# Patient Record
Sex: Female | Born: 1940
Health system: Southern US, Community
[De-identification: ages and names within clinical notes are randomized; demographics above are authoritative.]

## PROBLEM LIST (undated history)

## (undated) DIAGNOSIS — G4733 Obstructive sleep apnea (adult) (pediatric): Secondary | ICD-10-CM

## (undated) DIAGNOSIS — E559 Vitamin D deficiency, unspecified: Secondary | ICD-10-CM

## (undated) DIAGNOSIS — N289 Disorder of kidney and ureter, unspecified: Secondary | ICD-10-CM

## (undated) DIAGNOSIS — G2581 Restless legs syndrome: Secondary | ICD-10-CM

## (undated) DIAGNOSIS — F32A Depression, unspecified: Secondary | ICD-10-CM

## (undated) DIAGNOSIS — E785 Hyperlipidemia, unspecified: Secondary | ICD-10-CM

## (undated) DIAGNOSIS — J189 Pneumonia, unspecified organism: Secondary | ICD-10-CM

## (undated) DIAGNOSIS — J449 Chronic obstructive pulmonary disease, unspecified: Secondary | ICD-10-CM

## (undated) DIAGNOSIS — F329 Major depressive disorder, single episode, unspecified: Secondary | ICD-10-CM

## (undated) DIAGNOSIS — M069 Rheumatoid arthritis, unspecified: Secondary | ICD-10-CM

## (undated) DIAGNOSIS — Z8669 Personal history of other diseases of the nervous system and sense organs: Secondary | ICD-10-CM

## (undated) DIAGNOSIS — M1990 Unspecified osteoarthritis, unspecified site: Secondary | ICD-10-CM

## (undated) HISTORY — DX: Obstructive sleep apnea (adult) (pediatric): G47.33

## (undated) HISTORY — DX: Personal history of other diseases of the nervous system and sense organs: Z86.69

## (undated) HISTORY — DX: Hyperlipidemia, unspecified: E78.5

## (undated) HISTORY — DX: Rheumatoid arthritis, unspecified: M06.9

## (undated) HISTORY — DX: Major depressive disorder, single episode, unspecified: F32.9

## (undated) HISTORY — DX: Disorder of kidney and ureter, unspecified: N28.9

## (undated) HISTORY — PX: KNEE SURGERY: SHX244

## (undated) HISTORY — DX: Pneumonia, unspecified organism: J18.9

## (undated) HISTORY — DX: Depression, unspecified: F32.A

## (undated) HISTORY — DX: Vitamin D deficiency, unspecified: E55.9

## (undated) HISTORY — DX: Chronic obstructive pulmonary disease, unspecified: J44.9

## (undated) HISTORY — DX: Restless legs syndrome: G25.81

## (undated) HISTORY — DX: Unspecified osteoarthritis, unspecified site: M19.90

---

## 1988-06-11 HISTORY — PX: TOTAL ABDOMINAL HYSTERECTOMY: SHX209

## 2011-03-15 ENCOUNTER — Encounter: Payer: Self-pay | Admitting: Pulmonary Disease

## 2011-03-18 ENCOUNTER — Ambulatory Visit (INDEPENDENT_AMBULATORY_CARE_PROVIDER_SITE_OTHER)
Admission: RE | Admit: 2011-03-18 | Discharge: 2011-03-18 | Disposition: A | Discharge status: Home / Self Care | Payer: Medicare Other | Source: Ambulatory Visit | Attending: Pulmonary Disease | Admitting: Pulmonary Disease

## 2011-03-18 ENCOUNTER — Encounter: Payer: Self-pay | Admitting: Pulmonary Disease

## 2011-03-18 ENCOUNTER — Ambulatory Visit (INDEPENDENT_AMBULATORY_CARE_PROVIDER_SITE_OTHER): Payer: Medicare Other | Admitting: Pulmonary Disease

## 2011-03-18 VITALS — BP 140/70 | HR 90 | Temp 98.2°F | Ht 63.5 in | Wt 230.6 lb

## 2011-03-18 DIAGNOSIS — R0609 Other forms of dyspnea: Secondary | ICD-10-CM | POA: Insufficient documentation

## 2011-03-18 DIAGNOSIS — J449 Chronic obstructive pulmonary disease, unspecified: Secondary | ICD-10-CM | POA: Insufficient documentation

## 2011-03-18 DIAGNOSIS — R0600 Dyspnea, unspecified: Secondary | ICD-10-CM

## 2011-03-18 DIAGNOSIS — R0989 Other specified symptoms and signs involving the circulatory and respiratory systems: Secondary | ICD-10-CM

## 2011-03-18 NOTE — Patient Instructions (Signed)
No change of inhaler medications currently Will check a cxr today Will contact you once I get a copy of your recent breathing tests.

## 2011-03-18 NOTE — Progress Notes (Signed)
  Subjective:    Patient ID: Sherry Burke, female    DOB: [birth year], 70 y.o.   MRN: [id number]  HPI The pt is a 70y/o female who I have been asked to see for dyspnea on exertion.  She carries the diagnosis of "copd", although she has never smoked.  She does state that she was diagnosed with asthma in her early 18's.  She has been on advair for a long time, and thinks it has helped her symptoms.  Currently, she c/o doe at less than one block at a moderate pace, and will get winded bringing groceries in from the car.  She feels a tightness in her chest with activity, but tells me she had a negative cath in [place] in 2009.  She has minimal cough and no mucus, and her weight is down about 15 pounds in the last one year. She has a h/o "mtx toxicity" in the past which resulted in a "lung disease".     Review of Systems  Constitutional: Negative for fever and unexpected weight change.  HENT: Positive for dental problem. Negative for ear pain, nosebleeds, congestion, sore throat, rhinorrhea, sneezing, trouble swallowing, postnasal drip and sinus pressure.   Eyes: Negative for redness and itching.  Respiratory: Positive for cough and shortness of breath. Negative for chest tightness and wheezing.   Cardiovascular: Positive for leg swelling. Negative for palpitations.  Gastrointestinal: Negative for nausea and vomiting.  Genitourinary: Negative for dysuria.  Musculoskeletal: Positive for joint swelling.  Skin: Negative for rash.  Neurological: Negative for headaches.  Hematological: Does not bruise/bleed easily.  Psychiatric/Behavioral: Positive for dysphoric mood. The patient is nervous/anxious.        Objective:   Physical Exam Constitutional: obese female, no acute distress  HENT:  Nares patent without discharge  Oropharynx without exudate, palate and uvula are normal  Eyes:  Perrla, eomi, no scleral icterus  Neck:  No JVD, no TMG  Cardiovascular:  Normal rate, regular rhythm, no rubs  or gallops.  No murmurs        Intact distal pulses  Pulmonary :  Normal breath sounds, no stridor or respiratory distress   No rales, rhonchi, or wheezing  Abdominal:  Soft, nondistended, bowel sounds present.  No tenderness noted.   Musculoskeletal:  No significant lower extremity edema noted.  Lymph Nodes:  No cervical lymphadenopathy noted  Skin:  No cyanosis noted  Neurologic:  Alert, appropriate, moves all 4 extremities without obvious deficit.         Assessment & Plan:

## 2011-03-18 NOTE — Assessment & Plan Note (Signed)
The pt carries a diagnosis of "copd", but has never smoked.  If this has been documented by pfts, it is unclear if this is due to fixed asthma, ?bronchiolitis related to her RA, or perhaps idiopathic copd?  The pt is having chest tightness with exertional activities, and I think we need to keep in mind angina even though she tells me she had negative cath in 2009.  Will check cxr, and schedule for full pfts (her last testing only had an expiratory time of 3 sec.).  She is to continue on her current meds until I see her back.  I have also encourage her to work on weight loss and some type of conditioning program.

## 2011-03-21 ENCOUNTER — Telehealth: Payer: Self-pay | Admitting: Pulmonary Disease

## 2011-03-21 NOTE — Telephone Encounter (Signed)
Please let pt know that her cxr is ok. No fibrosis, but does show calcified "spots" that are old.  Pt aware of cxr results and verbalized understanding and had no questions

## 2011-04-02 ENCOUNTER — Encounter: Payer: Self-pay | Admitting: Pulmonary Disease

## 2011-04-10 ENCOUNTER — Encounter: Payer: Self-pay | Admitting: Pulmonary Disease

## 2011-04-10 ENCOUNTER — Ambulatory Visit (INDEPENDENT_AMBULATORY_CARE_PROVIDER_SITE_OTHER): Payer: Medicare Other | Admitting: Pulmonary Disease

## 2011-04-10 VITALS — BP 136/74 | HR 101 | Temp 98.0°F | Ht 64.0 in | Wt 227.0 lb

## 2011-04-10 DIAGNOSIS — R0989 Other specified symptoms and signs involving the circulatory and respiratory systems: Secondary | ICD-10-CM

## 2011-04-10 DIAGNOSIS — R0609 Other forms of dyspnea: Secondary | ICD-10-CM

## 2011-04-10 DIAGNOSIS — R0600 Dyspnea, unspecified: Secondary | ICD-10-CM

## 2011-04-10 MED ORDER — MOMETASONE FURO-FORMOTEROL FUM 200-5 MCG/ACT IN AERO: 2.0000 | INHALATION_SPRAY | Freq: Two times a day (BID) | RESPIRATORY_TRACT | Status: DC

## 2011-04-10 NOTE — Progress Notes (Signed)
PFT done today. 

## 2011-04-10 NOTE — Progress Notes (Signed)
  Subjective:    Patient ID: Sherry Burke, female    DOB: 14-Sep-1941, 70 y.o.   MRN: [id number]  HPI The pt comes in today for f/u of her pfts as part of a w/u for doe.  She was found to have moderate airflow obstruction, but no restriction or DLCO abnl.  I have reviewed the study with her in detail, and answered all of here questions.  I have told her there is no evidence for lung damage due to MTX by either her breathing studies or cxr.    Review of Systems  Constitutional: Negative for fever and unexpected weight change.  HENT: Positive for dental problem and sinus pressure. Negative for ear pain, nosebleeds, congestion, sore throat, rhinorrhea, sneezing, trouble swallowing and postnasal drip.   Eyes: Negative for redness and itching.  Respiratory: Positive for chest tightness, shortness of breath and wheezing. Negative for cough.   Cardiovascular: Negative for palpitations and leg swelling.  Gastrointestinal: Negative for nausea and vomiting.  Genitourinary: Positive for dysuria.  Musculoskeletal: Positive for joint swelling.  Skin: Negative for rash.  Neurological: Negative for headaches.  Hematological: Bruises/bleeds easily.  Psychiatric/Behavioral: Positive for dysphoric mood. The patient is nervous/anxious.        Objective:   Physical Exam Obese female in nad Chest with mildly decreased bs, no wheezing Cor with rrr.  LE without edema, no cyanosis  Alert, moves all 4        Assessment & Plan:

## 2011-04-10 NOTE — Patient Instructions (Signed)
Stop advair Trial of dulera 200/5  2 inhalations am and pm for next 4 weeks.  Rinse mouth well, gargle, and swallow.  Please call me in 4 weeks with your response

## 2011-04-13 ENCOUNTER — Encounter: Payer: Self-pay | Admitting: Pulmonary Disease

## 2011-04-13 NOTE — Assessment & Plan Note (Signed)
The pt has moderate obstructive disease that I suspect is due to fixed asthma based on her history.  However, cannot exclude the possibility of bronchiolitis obliterans associated with her known RA.  There is nothing to suggest chronic lung disease associated with MTX treatment.  At this point, would like to try her on a higher dose of ICS/LABA and see if she has improvement.  I have also explained to pt that her weight and deconditioning are important contributors to her sob as well.

## 2011-04-22 ENCOUNTER — Telehealth: Payer: Self-pay | Admitting: Pulmonary Disease

## 2011-04-22 NOTE — Telephone Encounter (Signed)
Spoke with pt and advised needs to have her PCP refill her sleep meds. Pt verbalized understanding.

## 2011-04-22 NOTE — Telephone Encounter (Signed)
I do not see where [facility] has filled this for her- LMTCB

## 2011-05-09 ENCOUNTER — Encounter: Payer: Self-pay | Admitting: Pulmonary Disease

## 2011-05-10 ENCOUNTER — Telehealth: Payer: Self-pay | Admitting: Pulmonary Disease

## 2011-05-10 NOTE — Telephone Encounter (Signed)
PATIENT WAS GIVEN DULERA INHALER ON 03/18/11 FOR ASTHMA.  SHE WAS SUPPOSED TO USE FOR 1 MONTH AND CALL BACK TO TELL DR CLANCE IF THERE WAS IMPROVEMENT.  PATIENT FEELS IMPROVED AND WANTS PRESCRIPTION.  PHARMACY IS [facility].  SHE HAS ONLY 4 DAYS WORTH LEFT.

## 2011-05-10 NOTE — Telephone Encounter (Signed)
Per pt instructions from 04/10/11:  Stop advair  Trial of dulera 200/5 2 inhalations am and pm for next 4 weeks. Rinse mouth well, gargle, and swallow.  Please call me in 4 weeks with your response    Called, spoke with pt.  States she has been using the dulera 2 puffs bid and this has helped her breathing.  She has enough to last 1 more week and would like to know if [facility] would like her to continue this.  If so, would like 3 mo rx sent to [facility].  KC, pls advise.  Thanks!

## 2011-05-10 NOTE — Telephone Encounter (Signed)
Ok to call in script

## 2011-05-13 MED ORDER — MOMETASONE FURO-FORMOTEROL FUM 200-5 MCG/ACT IN AERO: 2.0000 | INHALATION_SPRAY | Freq: Two times a day (BID) | RESPIRATORY_TRACT | Status: DC

## 2011-05-13 NOTE — Telephone Encounter (Signed)
dulera rx sent to [facility] for 3 month supply -- pt aware and will call back to see if samples available if she will run out of medication before shipment arrives.

## 2011-05-16 ENCOUNTER — Encounter (HOSPITAL_COMMUNITY): Payer: Medicare Other | Attending: Rheumatology

## 2011-05-16 DIAGNOSIS — M069 Rheumatoid arthritis, unspecified: Secondary | ICD-10-CM | POA: Insufficient documentation

## 2011-05-31 ENCOUNTER — Encounter (HOSPITAL_COMMUNITY): Payer: Medicare Other

## 2011-06-03 ENCOUNTER — Encounter (HOSPITAL_COMMUNITY): Payer: Medicare Other

## 2011-08-02 IMAGING — CR DG CHEST 2V
2 series · 2 of 2 positions shown · non-contrast
Comparison: None.

CLINICAL DATA: Shortness of breath, COPD

CHEST - 2 VIEW

[view not recorded (1 of 2)]
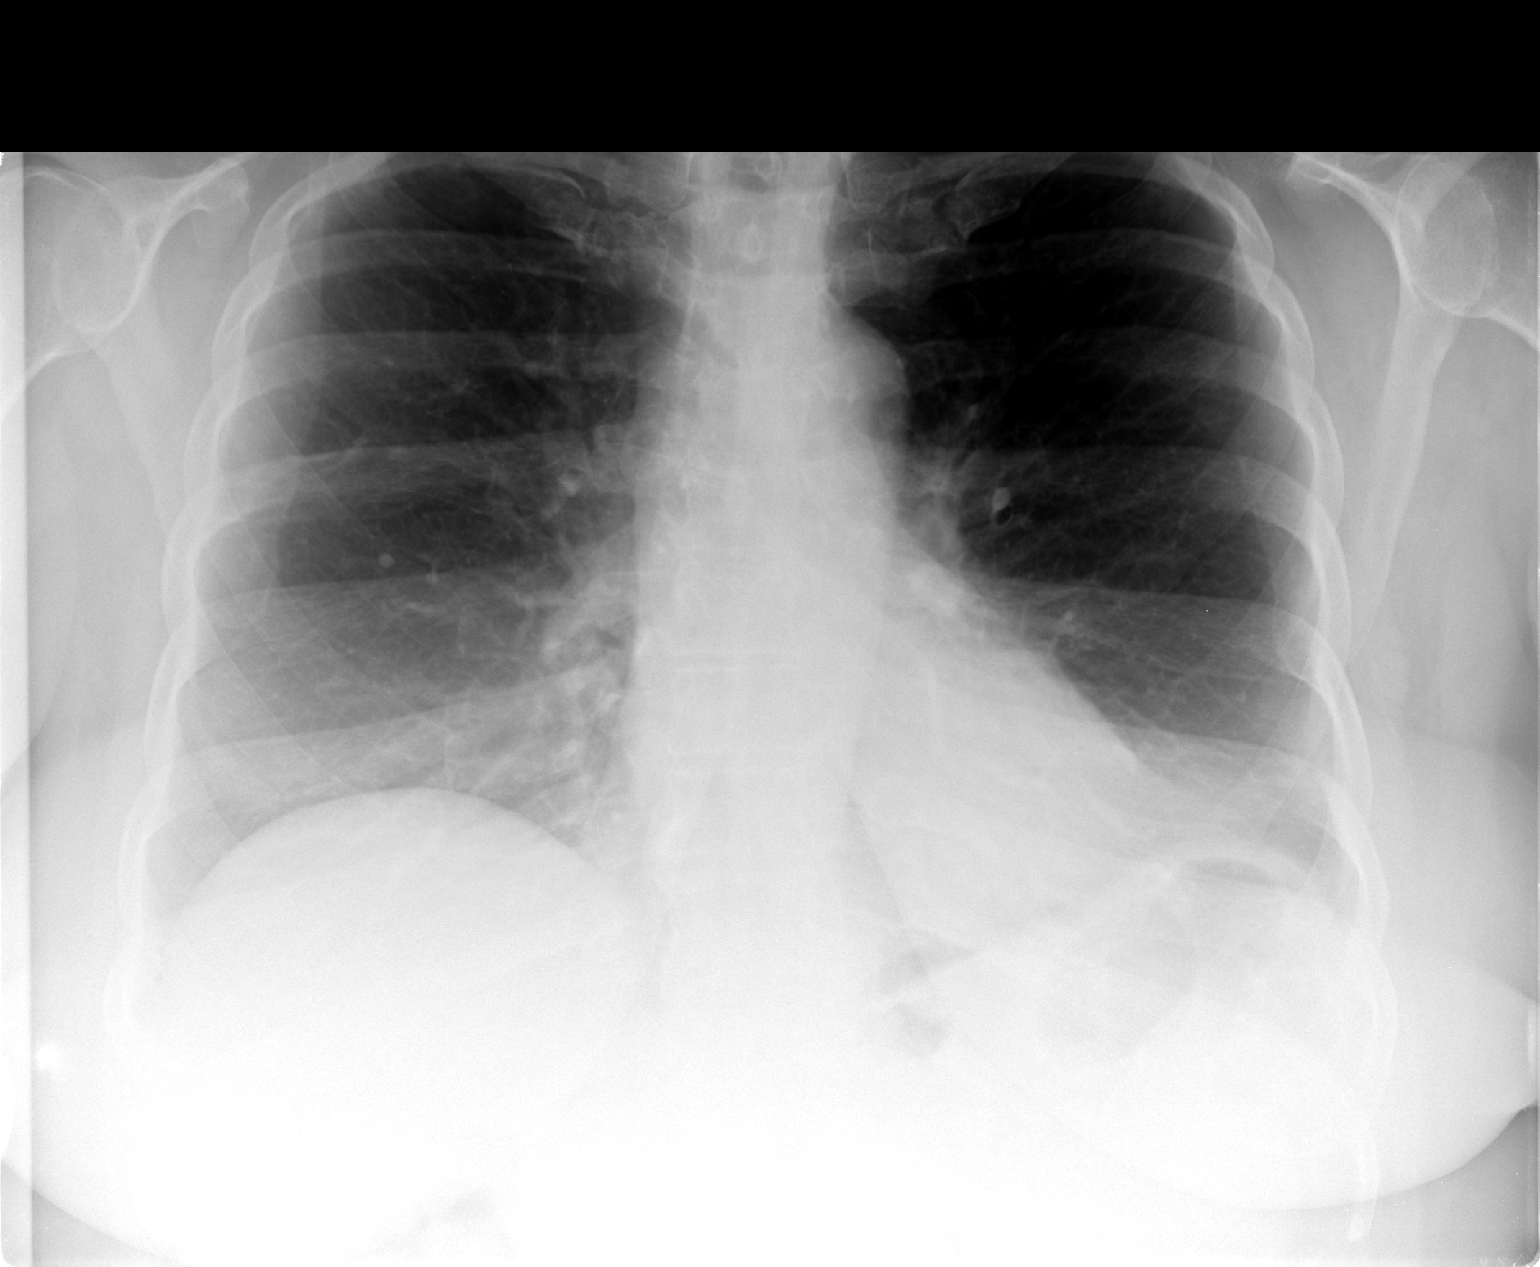

[view not recorded (2 of 2)]
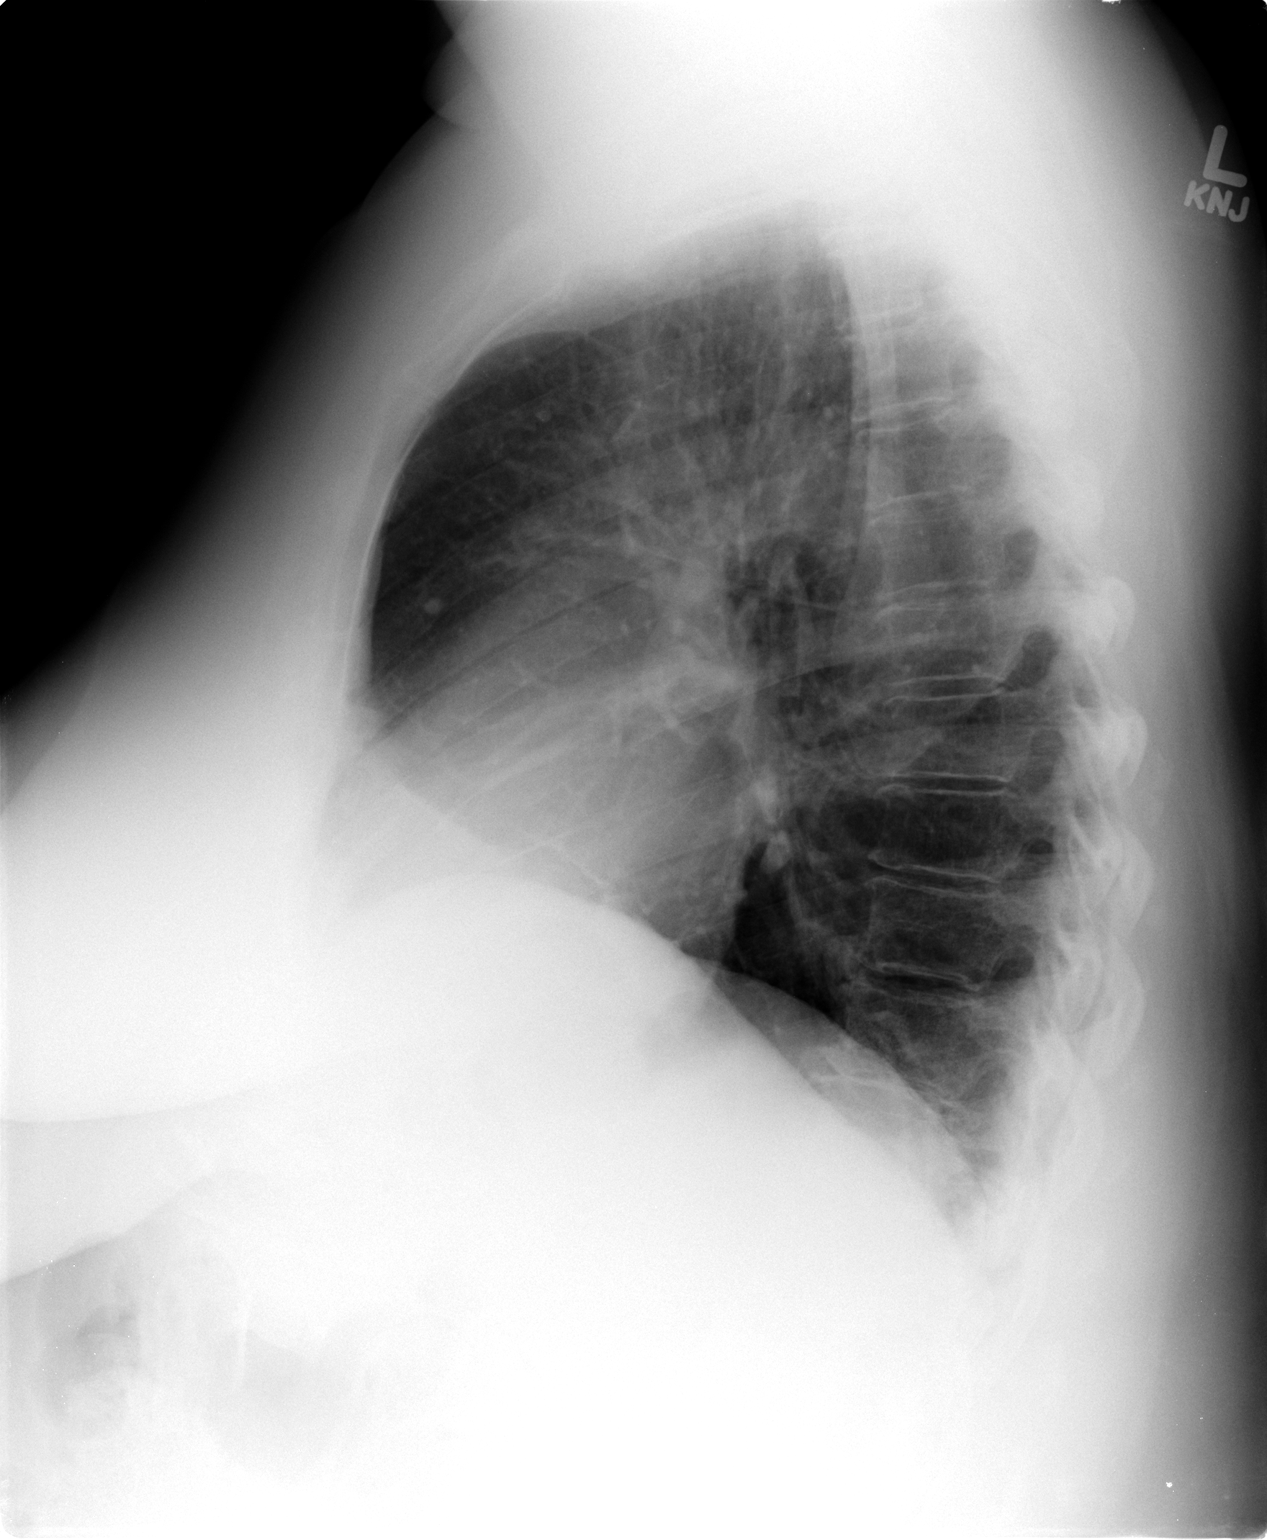

[2 of 2 positions shown; findings below may reference images not displayed]

FINDINGS: Small nodular opacities bilaterally most like represent
faintly calcified granulomas with probable left hilar calcified
nodes as well most consistent with prior granulomatous disease.  No
active infiltrate or effusion is seen.  Mediastinal contours are
normal.  The heart is within normal limits in size.  No bony
abnormality is seen.
IMPRESSION: No active lung disease.  Probable prior granulomatous disease.
Compare with prior or follow-up chest x-ray.

## 2011-10-14 ENCOUNTER — Other Ambulatory Visit: Payer: Self-pay | Admitting: Rheumatology

## 2011-11-14 DIAGNOSIS — M461 Sacroiliitis, not elsewhere classified: Secondary | ICD-10-CM | POA: Diagnosis not present

## 2011-11-14 DIAGNOSIS — M5137 Other intervertebral disc degeneration, lumbosacral region: Secondary | ICD-10-CM | POA: Diagnosis not present

## 2011-11-14 DIAGNOSIS — M47817 Spondylosis without myelopathy or radiculopathy, lumbosacral region: Secondary | ICD-10-CM | POA: Diagnosis not present

## 2011-11-14 DIAGNOSIS — IMO0002 Reserved for concepts with insufficient information to code with codable children: Secondary | ICD-10-CM | POA: Diagnosis not present

## 2011-11-14 DIAGNOSIS — G5600 Carpal tunnel syndrome, unspecified upper limb: Secondary | ICD-10-CM | POA: Diagnosis not present

## 2011-11-28 DIAGNOSIS — G5600 Carpal tunnel syndrome, unspecified upper limb: Secondary | ICD-10-CM | POA: Diagnosis not present

## 2011-12-10 DIAGNOSIS — M5137 Other intervertebral disc degeneration, lumbosacral region: Secondary | ICD-10-CM | POA: Diagnosis not present

## 2011-12-10 DIAGNOSIS — M545 Low back pain, unspecified: Secondary | ICD-10-CM | POA: Diagnosis not present

## 2011-12-10 DIAGNOSIS — IMO0002 Reserved for concepts with insufficient information to code with codable children: Secondary | ICD-10-CM | POA: Diagnosis not present

## 2011-12-10 DIAGNOSIS — M47817 Spondylosis without myelopathy or radiculopathy, lumbosacral region: Secondary | ICD-10-CM | POA: Diagnosis not present

## 2011-12-13 DIAGNOSIS — N3000 Acute cystitis without hematuria: Secondary | ICD-10-CM | POA: Diagnosis not present

## 2011-12-13 DIAGNOSIS — R7309 Other abnormal glucose: Secondary | ICD-10-CM | POA: Diagnosis not present

## 2011-12-13 DIAGNOSIS — E7800 Pure hypercholesterolemia, unspecified: Secondary | ICD-10-CM | POA: Diagnosis not present

## 2011-12-13 DIAGNOSIS — M549 Dorsalgia, unspecified: Secondary | ICD-10-CM | POA: Diagnosis not present

## 2011-12-13 DIAGNOSIS — F321 Major depressive disorder, single episode, moderate: Secondary | ICD-10-CM | POA: Diagnosis not present

## 2011-12-13 DIAGNOSIS — Z79899 Other long term (current) drug therapy: Secondary | ICD-10-CM | POA: Diagnosis not present

## 2011-12-13 DIAGNOSIS — E782 Mixed hyperlipidemia: Secondary | ICD-10-CM | POA: Diagnosis not present

## 2011-12-13 DIAGNOSIS — I10 Essential (primary) hypertension: Secondary | ICD-10-CM | POA: Diagnosis not present

## 2011-12-26 DIAGNOSIS — B370 Candidal stomatitis: Secondary | ICD-10-CM | POA: Diagnosis not present

## 2011-12-26 DIAGNOSIS — N3000 Acute cystitis without hematuria: Secondary | ICD-10-CM | POA: Diagnosis not present

## 2011-12-26 DIAGNOSIS — J45901 Unspecified asthma with (acute) exacerbation: Secondary | ICD-10-CM | POA: Diagnosis not present

## 2011-12-26 DIAGNOSIS — R339 Retention of urine, unspecified: Secondary | ICD-10-CM | POA: Diagnosis not present

## 2011-12-26 DIAGNOSIS — J0100 Acute maxillary sinusitis, unspecified: Secondary | ICD-10-CM | POA: Diagnosis not present

## 2011-12-30 DIAGNOSIS — G5600 Carpal tunnel syndrome, unspecified upper limb: Secondary | ICD-10-CM | POA: Diagnosis not present

## 2011-12-30 DIAGNOSIS — M47817 Spondylosis without myelopathy or radiculopathy, lumbosacral region: Secondary | ICD-10-CM | POA: Diagnosis not present

## 2011-12-30 DIAGNOSIS — M5137 Other intervertebral disc degeneration, lumbosacral region: Secondary | ICD-10-CM | POA: Diagnosis not present

## 2011-12-30 DIAGNOSIS — IMO0002 Reserved for concepts with insufficient information to code with codable children: Secondary | ICD-10-CM | POA: Diagnosis not present

## 2012-01-13 DIAGNOSIS — M069 Rheumatoid arthritis, unspecified: Secondary | ICD-10-CM | POA: Diagnosis not present

## 2012-01-23 DIAGNOSIS — N3000 Acute cystitis without hematuria: Secondary | ICD-10-CM | POA: Diagnosis not present

## 2012-01-28 ENCOUNTER — Other Ambulatory Visit (HOSPITAL_COMMUNITY): Payer: Self-pay | Admitting: *Deleted

## 2012-01-28 MED ORDER — SODIUM CHLORIDE 0.9 % IV SOLN: 1000.0000 mg | INTRAVENOUS | Status: DC

## 2012-01-29 ENCOUNTER — Encounter (HOSPITAL_COMMUNITY)
Admission: RE | Admit: 2012-01-29 | Discharge: 2012-01-29 | Disposition: A | Discharge status: Home / Self Care | Payer: Medicare Other | Source: Ambulatory Visit | Attending: Rheumatology | Admitting: Rheumatology

## 2012-01-29 DIAGNOSIS — M069 Rheumatoid arthritis, unspecified: Secondary | ICD-10-CM | POA: Diagnosis not present

## 2012-01-29 MED ORDER — METHYLPREDNISOLONE SODIUM SUCC 125 MG IJ SOLR
100.0000 mg | INTRAMUSCULAR | Status: DC
  Administered 2012-01-29: 100 mg via INTRAVENOUS
  Filled 2012-01-29: qty 2

## 2012-01-29 MED ORDER — SODIUM CHLORIDE 0.9 % IV SOLN
INTRAVENOUS | Status: DC
  Administered 2012-01-29: 09:00:00 via INTRAVENOUS

## 2012-01-29 MED ORDER — SODIUM CHLORIDE 0.9 % IV SOLN
1000.0000 mg | INTRAVENOUS | Status: DC
  Administered 2012-01-29: 1000 mg via INTRAVENOUS
  Filled 2012-01-29: qty 100

## 2012-02-12 ENCOUNTER — Encounter (HOSPITAL_COMMUNITY)
Admission: RE | Admit: 2012-02-12 | Discharge: 2012-02-12 | Disposition: A | Discharge status: Home / Self Care | Payer: Medicare Other | Source: Ambulatory Visit | Attending: Rheumatology | Admitting: Rheumatology

## 2012-02-12 DIAGNOSIS — M069 Rheumatoid arthritis, unspecified: Secondary | ICD-10-CM | POA: Insufficient documentation

## 2012-02-12 MED ORDER — LORATADINE 10 MG PO TABS
10.0000 mg | ORAL_TABLET | Freq: Once | ORAL | Status: AC
  Administered 2012-02-12: 10 mg via ORAL
  Filled 2012-02-12: qty 1

## 2012-02-12 MED ORDER — METHYLPREDNISOLONE SODIUM SUCC 125 MG IJ SOLR
100.0000 mg | INTRAMUSCULAR | Status: AC
  Administered 2012-02-12: 100 mg via INTRAVENOUS
  Filled 2012-02-12: qty 2

## 2012-02-12 MED ORDER — SODIUM CHLORIDE 0.9 % IV SOLN
1000.0000 mg | INTRAVENOUS | Status: AC
  Administered 2012-02-12: 1000 mg via INTRAVENOUS
  Filled 2012-02-12: qty 100

## 2012-02-12 MED ORDER — SODIUM CHLORIDE 0.9 % IV SOLN
INTRAVENOUS | Status: AC
  Administered 2012-02-12: 09:00:00 via INTRAVENOUS

## 2012-02-12 NOTE — Progress Notes (Signed)
Patient states that she forgot to take her Zyrtec last night. Called Dr. Tonette Bihari office and spoke with Wallis Bamberg, RN. Orders received.

## 2012-02-17 DIAGNOSIS — M47817 Spondylosis without myelopathy or radiculopathy, lumbosacral region: Secondary | ICD-10-CM | POA: Diagnosis not present

## 2012-02-17 DIAGNOSIS — IMO0002 Reserved for concepts with insufficient information to code with codable children: Secondary | ICD-10-CM | POA: Diagnosis not present

## 2012-02-21 DIAGNOSIS — M549 Dorsalgia, unspecified: Secondary | ICD-10-CM | POA: Diagnosis not present

## 2012-02-21 DIAGNOSIS — I10 Essential (primary) hypertension: Secondary | ICD-10-CM | POA: Diagnosis not present

## 2012-02-21 DIAGNOSIS — G2581 Restless legs syndrome: Secondary | ICD-10-CM | POA: Diagnosis not present

## 2012-02-21 DIAGNOSIS — R7309 Other abnormal glucose: Secondary | ICD-10-CM | POA: Diagnosis not present

## 2012-02-21 DIAGNOSIS — R7301 Impaired fasting glucose: Secondary | ICD-10-CM | POA: Diagnosis not present

## 2012-02-21 DIAGNOSIS — E7800 Pure hypercholesterolemia, unspecified: Secondary | ICD-10-CM | POA: Diagnosis not present

## 2012-02-21 DIAGNOSIS — N3000 Acute cystitis without hematuria: Secondary | ICD-10-CM | POA: Diagnosis not present

## 2012-02-21 DIAGNOSIS — Z79899 Other long term (current) drug therapy: Secondary | ICD-10-CM | POA: Diagnosis not present

## 2012-02-21 DIAGNOSIS — R5381 Other malaise: Secondary | ICD-10-CM | POA: Diagnosis not present

## 2012-02-21 DIAGNOSIS — E782 Mixed hyperlipidemia: Secondary | ICD-10-CM | POA: Diagnosis not present

## 2012-03-13 DIAGNOSIS — H2589 Other age-related cataract: Secondary | ICD-10-CM | POA: Diagnosis not present

## 2012-03-26 DIAGNOSIS — E782 Mixed hyperlipidemia: Secondary | ICD-10-CM | POA: Diagnosis not present

## 2012-03-26 DIAGNOSIS — G4730 Sleep apnea, unspecified: Secondary | ICD-10-CM | POA: Diagnosis not present

## 2012-03-26 DIAGNOSIS — G2581 Restless legs syndrome: Secondary | ICD-10-CM | POA: Diagnosis not present

## 2012-03-26 DIAGNOSIS — I10 Essential (primary) hypertension: Secondary | ICD-10-CM | POA: Diagnosis not present

## 2012-03-26 DIAGNOSIS — M549 Dorsalgia, unspecified: Secondary | ICD-10-CM | POA: Diagnosis not present

## 2012-03-31 DIAGNOSIS — H2589 Other age-related cataract: Secondary | ICD-10-CM | POA: Diagnosis not present

## 2012-04-07 DIAGNOSIS — H2589 Other age-related cataract: Secondary | ICD-10-CM | POA: Diagnosis not present

## 2012-04-07 DIAGNOSIS — H269 Unspecified cataract: Secondary | ICD-10-CM | POA: Diagnosis not present

## 2012-04-21 DIAGNOSIS — G4730 Sleep apnea, unspecified: Secondary | ICD-10-CM | POA: Diagnosis not present

## 2012-04-21 DIAGNOSIS — I10 Essential (primary) hypertension: Secondary | ICD-10-CM | POA: Diagnosis not present

## 2012-04-21 DIAGNOSIS — M549 Dorsalgia, unspecified: Secondary | ICD-10-CM | POA: Diagnosis not present

## 2012-04-22 ENCOUNTER — Encounter: Payer: Self-pay | Admitting: Pulmonary Disease

## 2012-04-22 ENCOUNTER — Ambulatory Visit (INDEPENDENT_AMBULATORY_CARE_PROVIDER_SITE_OTHER): Payer: Medicare Other | Admitting: Pulmonary Disease

## 2012-04-22 VITALS — BP 130/76 | HR 91 | Temp 98.1°F | Ht 62.0 in | Wt 219.2 lb

## 2012-04-22 DIAGNOSIS — G2581 Restless legs syndrome: Secondary | ICD-10-CM | POA: Insufficient documentation

## 2012-04-22 DIAGNOSIS — G4733 Obstructive sleep apnea (adult) (pediatric): Secondary | ICD-10-CM

## 2012-04-22 MED ORDER — GABAPENTIN ENACARBIL ER 600 MG PO TBCR: 600.0000 mg | EXTENDED_RELEASE_TABLET | Freq: Every evening | ORAL | Status: AC

## 2012-04-22 NOTE — Patient Instructions (Addendum)
Will start on horizant 600mg  each evening around 5pm with food.   Stop requip while trying horizant. Will set up on cpap with nasal pillows.  Please call if having issues with cpap tolerance.  Followup with me in 5 weeks to check on cpap, but call after 1-2 weeks on horizant to let me know how your leg movements are doing.

## 2012-04-22 NOTE — Assessment & Plan Note (Signed)
The patient has a history of moderate sleep apnea in the past, and most likely has gained weight since that time.  She sleeps alone, and therefore it is unclear if she continues to have snoring and an abnormal breathing pattern during sleep.  She feels that she is rested in the mornings, and denies any significant sleepiness issues during the day.  Her Epworth sleepiness score today is only one.  I have had a long discussion with her about the pathophysiology of sleep apnea, including its impact on quality of life and cardiovascular health.  I would be more concerned about the health impact since she does not feel overly symptomatic.  She is willing to try CPAP again if we can get a lower profile mask.  I would like to try her on nasal pillows.  I have also encouraged her to work aggressively on weight loss.

## 2012-04-22 NOTE — Progress Notes (Signed)
Subjective:    Patient ID: Sherry Burke, female    DOB: 06-Jul-1941, 71 y.o.   MRN: [id number]  HPI The patient is a 71 year old female who I've been asked to see for multiple sleep issues.  She has a history of moderate obstructive sleep apnea diagnosed in 2011, and wore CPAP off and on for approximately 3 months.  She discontinued CPAP because it disrupted her hair, and also had claustrophobia associated with the full face mask.  She never tried a nasal mask or nasal pillows.  The patient was also found to have small to moderate numbers of limb movements during the night, with 4 per hour resulting in arousal or awakening.  She has a history that is suggestive of the restless leg syndrome, and is currently on treatment with persistent symptoms.  The patient sleeps alone, and is unsure if she still snores or has an abnormal breathing pattern during sleep.  She gets up at 2 AM by choice, and feels rested upon arising.  She denies daytime sleepiness with periods of inactivity unless she takes a pain medication.  She has no issues with sleepiness while driving.  She has an abnormal sensation in her legs that can be painful at times, and results in an uncomfortable feeling that is only somewhat improved with movement.  It is worse in the evenings, but really bothers her all day starting in the a.m.'s.  The pain can extend from her hip around her buttocks and down into her feet at times.  Complicating all of this is that she has a history of spinal stenosis.  She is currently on Requip, and takes 2 in the mornings and 3 in the early evenings.  This does help, but again she has significant breakthrough symptoms.  She denies any history of diabetes or neuropathy.  Sleep Questionnaire: What time do you typically go to bed?( Between what hours) 7:30-8:30pm How long does it take you to fall asleep? go right to sleep How many times during the night do you wake up? 2 What time do you get out of bed to start your day? 0200  Do you drive or operate heavy machinery in your occupation? No How much has your weight changed (up or down) over the past two years? (In pounds) 0 oz (0 kg) Have you ever had a sleep study before? Yes If yes, location of study? If yes, date of study? 12/2009 Do you currently use CPAP? No Do you wear oxygen at any time? No    Review of Systems  Constitutional: Negative.  Negative for fever and unexpected weight change.  HENT: Negative.  Negative for ear pain, nosebleeds, congestion, sore throat, rhinorrhea, sneezing, trouble swallowing, dental problem, postnasal drip and sinus pressure.   Eyes: Negative.  Negative for redness and itching.  Respiratory: Positive for shortness of breath. Negative for cough, chest tightness and wheezing.   Cardiovascular: Positive for leg swelling. Negative for palpitations.  Gastrointestinal: Negative for nausea and vomiting.       Heartburn  Genitourinary: Negative.  Negative for dysuria.  Musculoskeletal: Negative.  Negative for joint swelling.  Skin: Negative.  Negative for rash.  Neurological: Negative.  Negative for headaches.  Hematological: Negative.  Does not bruise/bleed easily.  Psychiatric/Behavioral: Negative for dysphoric mood. The patient is nervous/anxious.        Objective:   Physical Exam Constitutional: obese female, no acute distress  HENT:  Nares patent without discharge  Oropharynx without exudate, palate and uvula are elongated.  Eyes:  Perrla, eomi, no scleral icterus  Neck:  No JVD, no TMG  Cardiovascular:  Normal rate, regular rhythm, no rubs or gallops.  No murmurs        Intact distal pulses  Pulmonary :  Normal breath sounds, no stridor or respiratory distress   No rales, rhonchi, or wheezing  Abdominal:  Soft, nondistended, bowel sounds present.  No tenderness noted.   Musculoskeletal:  1+ lower extremity edema noted.  Lymph Nodes:  No cervical lymphadenopathy noted  Skin:  No cyanosis noted  Neurologic:   Alert, appropriate, moves all 4 extremities without obvious deficit.         Assessment & Plan:

## 2012-04-22 NOTE — Assessment & Plan Note (Signed)
The patient has significant leg discomfort that may or may not be secondary to restless leg syndrome.  Some features of her history is suggestive of this diagnosis, while other history sounds more neuropathic in origin.  She tells me that she has significant spinal stenosis, and I wonder if she has nerve compression.  She has not responded well to high dose Requip, but tells me that she has never tried Mirapex.  I would like to try her on horizant once a day to see if this helps.  If it does not, this may be more of a neuropathic problem.  I would also check an iron panel if this has not been done recently, since iron deficiency is a significant contributor to RLS.  I will leave this to her primary care physician.

## 2012-04-24 ENCOUNTER — Telehealth: Payer: Self-pay | Admitting: Pulmonary Disease

## 2012-04-24 NOTE — Telephone Encounter (Signed)
lmomtcb x1 

## 2012-04-29 NOTE — Telephone Encounter (Signed)
ATC x 1-unable to reach patient or leave a message for patient.

## 2012-05-01 NOTE — Telephone Encounter (Signed)
i have attempted to call pt but the number in the chart has been disconnected.  Will sign off of this message and wait for pt to call back.

## 2012-05-19 DIAGNOSIS — M25569 Pain in unspecified knee: Secondary | ICD-10-CM | POA: Diagnosis not present

## 2012-05-19 DIAGNOSIS — IMO0002 Reserved for concepts with insufficient information to code with codable children: Secondary | ICD-10-CM | POA: Diagnosis not present

## 2012-05-19 DIAGNOSIS — M069 Rheumatoid arthritis, unspecified: Secondary | ICD-10-CM | POA: Diagnosis not present

## 2012-05-19 DIAGNOSIS — M1710 Unilateral primary osteoarthritis, unspecified knee: Secondary | ICD-10-CM | POA: Diagnosis not present

## 2012-05-19 DIAGNOSIS — M48061 Spinal stenosis, lumbar region without neurogenic claudication: Secondary | ICD-10-CM | POA: Diagnosis not present

## 2012-05-21 DIAGNOSIS — N390 Urinary tract infection, site not specified: Secondary | ICD-10-CM | POA: Diagnosis not present

## 2012-05-21 DIAGNOSIS — R7301 Impaired fasting glucose: Secondary | ICD-10-CM | POA: Diagnosis not present

## 2012-05-21 DIAGNOSIS — R609 Edema, unspecified: Secondary | ICD-10-CM | POA: Diagnosis not present

## 2012-05-21 DIAGNOSIS — R0602 Shortness of breath: Secondary | ICD-10-CM | POA: Diagnosis not present

## 2012-05-21 DIAGNOSIS — Z79899 Other long term (current) drug therapy: Secondary | ICD-10-CM | POA: Diagnosis not present

## 2012-05-21 DIAGNOSIS — M549 Dorsalgia, unspecified: Secondary | ICD-10-CM | POA: Diagnosis not present

## 2012-05-21 DIAGNOSIS — G4730 Sleep apnea, unspecified: Secondary | ICD-10-CM | POA: Diagnosis not present

## 2012-05-25 DIAGNOSIS — I369 Nonrheumatic tricuspid valve disorder, unspecified: Secondary | ICD-10-CM | POA: Diagnosis not present

## 2012-05-25 DIAGNOSIS — R0602 Shortness of breath: Secondary | ICD-10-CM | POA: Diagnosis not present

## 2012-05-25 DIAGNOSIS — R609 Edema, unspecified: Secondary | ICD-10-CM | POA: Diagnosis not present

## 2012-05-25 DIAGNOSIS — I359 Nonrheumatic aortic valve disorder, unspecified: Secondary | ICD-10-CM | POA: Diagnosis not present

## 2012-05-25 DIAGNOSIS — I079 Rheumatic tricuspid valve disease, unspecified: Secondary | ICD-10-CM | POA: Diagnosis not present

## 2012-06-05 DIAGNOSIS — M4714 Other spondylosis with myelopathy, thoracic region: Secondary | ICD-10-CM | POA: Diagnosis not present

## 2012-06-05 DIAGNOSIS — M5137 Other intervertebral disc degeneration, lumbosacral region: Secondary | ICD-10-CM | POA: Diagnosis not present

## 2012-06-05 DIAGNOSIS — IMO0002 Reserved for concepts with insufficient information to code with codable children: Secondary | ICD-10-CM | POA: Diagnosis not present

## 2012-06-09 DIAGNOSIS — M48061 Spinal stenosis, lumbar region without neurogenic claudication: Secondary | ICD-10-CM | POA: Diagnosis not present

## 2012-06-09 DIAGNOSIS — M546 Pain in thoracic spine: Secondary | ICD-10-CM | POA: Diagnosis not present

## 2012-06-09 DIAGNOSIS — M4120 Other idiopathic scoliosis, site unspecified: Secondary | ICD-10-CM | POA: Diagnosis not present

## 2012-06-09 DIAGNOSIS — R262 Difficulty in walking, not elsewhere classified: Secondary | ICD-10-CM | POA: Diagnosis not present

## 2012-06-09 DIAGNOSIS — M5137 Other intervertebral disc degeneration, lumbosacral region: Secondary | ICD-10-CM | POA: Diagnosis not present

## 2012-06-09 DIAGNOSIS — IMO0002 Reserved for concepts with insufficient information to code with codable children: Secondary | ICD-10-CM | POA: Diagnosis not present

## 2012-06-09 DIAGNOSIS — IMO0001 Reserved for inherently not codable concepts without codable children: Secondary | ICD-10-CM | POA: Diagnosis not present

## 2012-06-09 DIAGNOSIS — M549 Dorsalgia, unspecified: Secondary | ICD-10-CM | POA: Diagnosis not present

## 2012-06-09 DIAGNOSIS — M4714 Other spondylosis with myelopathy, thoracic region: Secondary | ICD-10-CM | POA: Diagnosis not present

## 2012-06-10 ENCOUNTER — Ambulatory Visit (INDEPENDENT_AMBULATORY_CARE_PROVIDER_SITE_OTHER): Payer: Medicare Other | Admitting: Pulmonary Disease

## 2012-06-10 ENCOUNTER — Encounter: Payer: Self-pay | Admitting: Pulmonary Disease

## 2012-06-10 VITALS — BP 138/68 | HR 80 | Temp 99.0°F | Ht 62.0 in | Wt 215.6 lb

## 2012-06-10 DIAGNOSIS — G4733 Obstructive sleep apnea (adult) (pediatric): Secondary | ICD-10-CM

## 2012-06-10 DIAGNOSIS — G2581 Restless legs syndrome: Secondary | ICD-10-CM | POA: Diagnosis not present

## 2012-06-10 DIAGNOSIS — J449 Chronic obstructive pulmonary disease, unspecified: Secondary | ICD-10-CM

## 2012-06-10 NOTE — Assessment & Plan Note (Signed)
The patient tried horizant, and it did really help her leg symptoms.  However, she felt that it made her feel "funny".  She has gone back to her Requip, and her leg symptoms have been well controlled.  I've asked her to continue on this.

## 2012-06-10 NOTE — Assessment & Plan Note (Signed)
The patient still has not received her nasal pillows, or had her CPAP adjusted, despite the order being sent in June of this year.  Will call her DME and find out what is going on.  I have also encouraged her to work aggressively on weight loss.

## 2012-06-10 NOTE — Patient Instructions (Addendum)
Stay on dulera am and pm Stay on requip as long as your leg symptoms are controlled. Work on weight loss Will send the order again to get you nasal pillows, and please call us if you do not hear from them. followup with me in 46mos, but please call to give feedback on cpap after you have worn for 4 weeks.

## 2012-06-10 NOTE — Progress Notes (Signed)
  Subjective:    Patient ID: Sherry Burke, female    DOB: [birth year], 71 y.o.   MRN: [id number]  HPI The patient comes in today for followup of her COPD, felt secondary to fixed asthma.  She was tried on dulera at the last visit, and feels that it has controlled her symptoms much better.  She feels that her exertional tolerance is that an acceptable baseline.  The patient also has a history of sleep apnea, and we ordered nasal pillows for her at the last visit.  Unfortunately, she has not heard from her DME.  We will send another order for her.  The patient was also tried on horizant last visit for her RLS, and did see improvement on this medication.  However, she felt it made her feel funny, and went back to her Requip.  Currently she feels her leg symptoms are adequately controlled.   Review of Systems  Constitutional: Negative for fever and unexpected weight change.  HENT: Positive for tinnitus. Negative for ear pain, nosebleeds, congestion, sore throat, rhinorrhea, sneezing, trouble swallowing, dental problem, postnasal drip and sinus pressure.   Eyes: Negative for redness and itching.  Respiratory: Positive for shortness of breath and wheezing. Negative for cough and chest tightness.   Cardiovascular: Positive for leg swelling. Negative for palpitations.       Right leg  Gastrointestinal: Negative for nausea and vomiting.  Genitourinary: Negative for dysuria.  Musculoskeletal: Negative for joint swelling.  Skin: Negative for rash.  Neurological: Negative for headaches.  Hematological: Does not bruise/bleed easily.  Psychiatric/Behavioral: Negative for dysphoric mood. The patient is not nervous/anxious.        Objective:   Physical Exam Obese female in no acute distress Nose without purulence or discharge noted Chest fairly clear to auscultation, no wheezing Cardiac exam is regular rate and rhythm Mild edema right lower extremity, the left is okay.  No cyanosis noted Alert and  oriented, moves all 4 extremities.       Assessment & Plan:

## 2012-06-10 NOTE — Assessment & Plan Note (Signed)
The patient has moderate air flow obstruction that I suspect is secondary to fixed asthma.  She has done better on dulera, and we'll therefore continue this medication.  I have also stressed the importance of weight reduction and a conditioning program.

## 2012-06-11 DIAGNOSIS — M48061 Spinal stenosis, lumbar region without neurogenic claudication: Secondary | ICD-10-CM | POA: Diagnosis not present

## 2012-06-11 DIAGNOSIS — M4714 Other spondylosis with myelopathy, thoracic region: Secondary | ICD-10-CM | POA: Diagnosis not present

## 2012-06-11 DIAGNOSIS — IMO0001 Reserved for inherently not codable concepts without codable children: Secondary | ICD-10-CM | POA: Diagnosis not present

## 2012-06-11 DIAGNOSIS — M4120 Other idiopathic scoliosis, site unspecified: Secondary | ICD-10-CM | POA: Diagnosis not present

## 2012-06-11 DIAGNOSIS — M5137 Other intervertebral disc degeneration, lumbosacral region: Secondary | ICD-10-CM | POA: Diagnosis not present

## 2012-06-11 DIAGNOSIS — IMO0002 Reserved for concepts with insufficient information to code with codable children: Secondary | ICD-10-CM | POA: Diagnosis not present

## 2012-06-11 DIAGNOSIS — R262 Difficulty in walking, not elsewhere classified: Secondary | ICD-10-CM | POA: Diagnosis not present

## 2012-06-11 DIAGNOSIS — M546 Pain in thoracic spine: Secondary | ICD-10-CM | POA: Diagnosis not present

## 2012-06-11 DIAGNOSIS — M549 Dorsalgia, unspecified: Secondary | ICD-10-CM | POA: Diagnosis not present

## 2012-06-16 DIAGNOSIS — M48061 Spinal stenosis, lumbar region without neurogenic claudication: Secondary | ICD-10-CM | POA: Diagnosis not present

## 2012-06-16 DIAGNOSIS — M4120 Other idiopathic scoliosis, site unspecified: Secondary | ICD-10-CM | POA: Diagnosis not present

## 2012-06-16 DIAGNOSIS — IMO0002 Reserved for concepts with insufficient information to code with codable children: Secondary | ICD-10-CM | POA: Diagnosis not present

## 2012-06-16 DIAGNOSIS — IMO0001 Reserved for inherently not codable concepts without codable children: Secondary | ICD-10-CM | POA: Diagnosis not present

## 2012-06-16 DIAGNOSIS — M549 Dorsalgia, unspecified: Secondary | ICD-10-CM | POA: Diagnosis not present

## 2012-06-16 DIAGNOSIS — R262 Difficulty in walking, not elsewhere classified: Secondary | ICD-10-CM | POA: Diagnosis not present

## 2012-06-18 DIAGNOSIS — M4120 Other idiopathic scoliosis, site unspecified: Secondary | ICD-10-CM | POA: Diagnosis not present

## 2012-06-18 DIAGNOSIS — IMO0002 Reserved for concepts with insufficient information to code with codable children: Secondary | ICD-10-CM | POA: Diagnosis not present

## 2012-06-18 DIAGNOSIS — I5030 Unspecified diastolic (congestive) heart failure: Secondary | ICD-10-CM | POA: Diagnosis not present

## 2012-06-18 DIAGNOSIS — M48061 Spinal stenosis, lumbar region without neurogenic claudication: Secondary | ICD-10-CM | POA: Diagnosis not present

## 2012-06-18 DIAGNOSIS — R262 Difficulty in walking, not elsewhere classified: Secondary | ICD-10-CM | POA: Diagnosis not present

## 2012-06-18 DIAGNOSIS — I10 Essential (primary) hypertension: Secondary | ICD-10-CM | POA: Diagnosis not present

## 2012-06-18 DIAGNOSIS — R7301 Impaired fasting glucose: Secondary | ICD-10-CM | POA: Diagnosis not present

## 2012-06-18 DIAGNOSIS — G2581 Restless legs syndrome: Secondary | ICD-10-CM | POA: Diagnosis not present

## 2012-06-18 DIAGNOSIS — M549 Dorsalgia, unspecified: Secondary | ICD-10-CM | POA: Diagnosis not present

## 2012-06-18 DIAGNOSIS — M25559 Pain in unspecified hip: Secondary | ICD-10-CM | POA: Diagnosis not present

## 2012-06-18 DIAGNOSIS — M25569 Pain in unspecified knee: Secondary | ICD-10-CM | POA: Diagnosis not present

## 2012-06-18 DIAGNOSIS — N3000 Acute cystitis without hematuria: Secondary | ICD-10-CM | POA: Diagnosis not present

## 2012-06-18 DIAGNOSIS — IMO0001 Reserved for inherently not codable concepts without codable children: Secondary | ICD-10-CM | POA: Diagnosis not present

## 2012-06-18 DIAGNOSIS — E782 Mixed hyperlipidemia: Secondary | ICD-10-CM | POA: Diagnosis not present

## 2012-06-23 DIAGNOSIS — IMO0001 Reserved for inherently not codable concepts without codable children: Secondary | ICD-10-CM | POA: Diagnosis not present

## 2012-06-23 DIAGNOSIS — M48061 Spinal stenosis, lumbar region without neurogenic claudication: Secondary | ICD-10-CM | POA: Diagnosis not present

## 2012-06-23 DIAGNOSIS — IMO0002 Reserved for concepts with insufficient information to code with codable children: Secondary | ICD-10-CM | POA: Diagnosis not present

## 2012-06-23 DIAGNOSIS — M4120 Other idiopathic scoliosis, site unspecified: Secondary | ICD-10-CM | POA: Diagnosis not present

## 2012-06-23 DIAGNOSIS — M549 Dorsalgia, unspecified: Secondary | ICD-10-CM | POA: Diagnosis not present

## 2012-06-23 DIAGNOSIS — R262 Difficulty in walking, not elsewhere classified: Secondary | ICD-10-CM | POA: Diagnosis not present

## 2012-06-25 DIAGNOSIS — IMO0001 Reserved for inherently not codable concepts without codable children: Secondary | ICD-10-CM | POA: Diagnosis not present

## 2012-06-25 DIAGNOSIS — M4120 Other idiopathic scoliosis, site unspecified: Secondary | ICD-10-CM | POA: Diagnosis not present

## 2012-06-25 DIAGNOSIS — M549 Dorsalgia, unspecified: Secondary | ICD-10-CM | POA: Diagnosis not present

## 2012-06-25 DIAGNOSIS — R262 Difficulty in walking, not elsewhere classified: Secondary | ICD-10-CM | POA: Diagnosis not present

## 2012-06-25 DIAGNOSIS — IMO0002 Reserved for concepts with insufficient information to code with codable children: Secondary | ICD-10-CM | POA: Diagnosis not present

## 2012-06-25 DIAGNOSIS — M48061 Spinal stenosis, lumbar region without neurogenic claudication: Secondary | ICD-10-CM | POA: Diagnosis not present

## 2012-06-26 DIAGNOSIS — M4714 Other spondylosis with myelopathy, thoracic region: Secondary | ICD-10-CM | POA: Diagnosis not present

## 2012-07-01 DIAGNOSIS — N952 Postmenopausal atrophic vaginitis: Secondary | ICD-10-CM | POA: Diagnosis not present

## 2012-07-01 DIAGNOSIS — N3090 Cystitis, unspecified without hematuria: Secondary | ICD-10-CM | POA: Diagnosis not present

## 2012-07-01 DIAGNOSIS — K5900 Constipation, unspecified: Secondary | ICD-10-CM | POA: Diagnosis not present

## 2012-07-16 ENCOUNTER — Telehealth: Payer: Self-pay | Admitting: Pulmonary Disease

## 2012-07-16 DIAGNOSIS — M545 Low back pain, unspecified: Secondary | ICD-10-CM | POA: Diagnosis not present

## 2012-07-16 DIAGNOSIS — M48061 Spinal stenosis, lumbar region without neurogenic claudication: Secondary | ICD-10-CM | POA: Diagnosis not present

## 2012-07-16 DIAGNOSIS — M5137 Other intervertebral disc degeneration, lumbosacral region: Secondary | ICD-10-CM | POA: Diagnosis not present

## 2012-07-16 NOTE — Telephone Encounter (Signed)
ATC PT line rings busy x 3 wcb

## 2012-07-17 NOTE — Telephone Encounter (Signed)
I spoke with pt and she stated she needed surgical clearance for back surgery--4-6 hr surgery. Dr. Patrice Paradise is doing the surgery. Pt stated she wants to come in to see [facility] for this. I have scheduled her for 07/24/12 at 4:30 since [facility] had an opening. Nothing further was needed

## 2012-07-21 ENCOUNTER — Other Ambulatory Visit (HOSPITAL_COMMUNITY): Payer: Self-pay | Admitting: *Deleted

## 2012-07-22 ENCOUNTER — Encounter (HOSPITAL_COMMUNITY)
Admission: RE | Admit: 2012-07-22 | Discharge: 2012-07-22 | Disposition: A | Discharge status: Home / Self Care | Payer: Medicare Other | Source: Ambulatory Visit | Attending: Rheumatology | Admitting: Rheumatology

## 2012-07-22 DIAGNOSIS — M069 Rheumatoid arthritis, unspecified: Secondary | ICD-10-CM | POA: Diagnosis not present

## 2012-07-22 MED ORDER — METHYLPREDNISOLONE SODIUM SUCC 125 MG IJ SOLR
100.0000 mg | INTRAMUSCULAR | Status: DC
  Administered 2012-07-22: 100 mg via INTRAVENOUS
  Filled 2012-07-22: qty 2

## 2012-07-22 MED ORDER — SODIUM CHLORIDE 0.9 % IV SOLN
1000.0000 mg | INTRAVENOUS | Status: DC
  Administered 2012-07-22: 1000 mg via INTRAVENOUS
  Filled 2012-07-22: qty 100

## 2012-07-22 MED ORDER — SODIUM CHLORIDE 0.9 % IV SOLN
INTRAVENOUS | Status: DC
  Administered 2012-07-22: 250 mL via INTRAVENOUS

## 2012-07-22 NOTE — Progress Notes (Signed)
0900 states she took claritin last pm.  Did not have zyrtek

## 2012-07-24 ENCOUNTER — Ambulatory Visit: Payer: Medicare Other | Admitting: Pulmonary Disease

## 2012-08-05 ENCOUNTER — Other Ambulatory Visit (HOSPITAL_COMMUNITY): Payer: Self-pay | Admitting: *Deleted

## 2012-08-06 ENCOUNTER — Encounter (HOSPITAL_COMMUNITY)
Admission: RE | Admit: 2012-08-06 | Discharge: 2012-08-06 | Disposition: A | Discharge status: Home / Self Care | Payer: Medicare Other | Source: Ambulatory Visit | Attending: Rheumatology | Admitting: Rheumatology

## 2012-08-06 DIAGNOSIS — M069 Rheumatoid arthritis, unspecified: Secondary | ICD-10-CM | POA: Diagnosis not present

## 2012-08-06 MED ORDER — METHYLPREDNISOLONE SODIUM SUCC 125 MG IJ SOLR
100.0000 mg | Freq: Once | INTRAMUSCULAR | Status: AC
  Administered 2012-08-06: 62.5 mg via INTRAVENOUS
  Filled 2012-08-06: qty 2

## 2012-08-06 MED ORDER — SODIUM CHLORIDE 0.9 % IV SOLN
Freq: Once | INTRAVENOUS | Status: AC
Start: 2012-08-06 — End: 2012-08-06
  Administered 2012-08-06: 09:00:00 via INTRAVENOUS

## 2012-08-06 MED ORDER — SODIUM CHLORIDE 0.9 % IV SOLN
1000.0000 mg | Freq: Once | INTRAVENOUS | Status: AC
  Administered 2012-08-06: 1000 mg via INTRAVENOUS
  Filled 2012-08-06: qty 100

## 2012-08-28 DIAGNOSIS — N952 Postmenopausal atrophic vaginitis: Secondary | ICD-10-CM | POA: Diagnosis not present

## 2012-08-28 DIAGNOSIS — N390 Urinary tract infection, site not specified: Secondary | ICD-10-CM | POA: Diagnosis not present

## 2012-09-16 DIAGNOSIS — R7309 Other abnormal glucose: Secondary | ICD-10-CM | POA: Diagnosis not present

## 2012-09-16 DIAGNOSIS — R634 Abnormal weight loss: Secondary | ICD-10-CM | POA: Diagnosis not present

## 2012-09-16 DIAGNOSIS — I10 Essential (primary) hypertension: Secondary | ICD-10-CM | POA: Diagnosis not present

## 2012-09-16 DIAGNOSIS — M549 Dorsalgia, unspecified: Secondary | ICD-10-CM | POA: Diagnosis not present

## 2012-09-16 DIAGNOSIS — M25559 Pain in unspecified hip: Secondary | ICD-10-CM | POA: Diagnosis not present

## 2012-09-16 DIAGNOSIS — E7800 Pure hypercholesterolemia, unspecified: Secondary | ICD-10-CM | POA: Diagnosis not present

## 2012-09-16 DIAGNOSIS — Z79899 Other long term (current) drug therapy: Secondary | ICD-10-CM | POA: Diagnosis not present

## 2012-09-16 DIAGNOSIS — E782 Mixed hyperlipidemia: Secondary | ICD-10-CM | POA: Diagnosis not present

## 2012-09-16 DIAGNOSIS — I5030 Unspecified diastolic (congestive) heart failure: Secondary | ICD-10-CM | POA: Diagnosis not present

## 2012-09-16 DIAGNOSIS — R7301 Impaired fasting glucose: Secondary | ICD-10-CM | POA: Diagnosis not present

## 2012-09-16 DIAGNOSIS — M25569 Pain in unspecified knee: Secondary | ICD-10-CM | POA: Diagnosis not present

## 2012-09-18 DIAGNOSIS — N952 Postmenopausal atrophic vaginitis: Secondary | ICD-10-CM | POA: Diagnosis not present

## 2012-09-18 DIAGNOSIS — K5900 Constipation, unspecified: Secondary | ICD-10-CM | POA: Diagnosis not present

## 2012-09-18 DIAGNOSIS — N3090 Cystitis, unspecified without hematuria: Secondary | ICD-10-CM | POA: Diagnosis not present

## 2012-10-08 DIAGNOSIS — M2669 Other specified disorders of temporomandibular joint: Secondary | ICD-10-CM | POA: Diagnosis present

## 2012-10-08 DIAGNOSIS — E785 Hyperlipidemia, unspecified: Secondary | ICD-10-CM | POA: Diagnosis not present

## 2012-10-08 DIAGNOSIS — J449 Chronic obstructive pulmonary disease, unspecified: Secondary | ICD-10-CM | POA: Diagnosis not present

## 2012-10-08 DIAGNOSIS — E876 Hypokalemia: Secondary | ICD-10-CM | POA: Diagnosis not present

## 2012-10-08 DIAGNOSIS — IMO0001 Reserved for inherently not codable concepts without codable children: Secondary | ICD-10-CM | POA: Diagnosis not present

## 2012-10-08 DIAGNOSIS — J329 Chronic sinusitis, unspecified: Secondary | ICD-10-CM | POA: Diagnosis not present

## 2012-10-08 DIAGNOSIS — M069 Rheumatoid arthritis, unspecified: Secondary | ICD-10-CM | POA: Diagnosis not present

## 2012-10-08 DIAGNOSIS — G8929 Other chronic pain: Secondary | ICD-10-CM | POA: Diagnosis present

## 2012-10-08 DIAGNOSIS — R112 Nausea with vomiting, unspecified: Secondary | ICD-10-CM | POA: Diagnosis not present

## 2012-10-08 DIAGNOSIS — J4489 Other specified chronic obstructive pulmonary disease: Secondary | ICD-10-CM | POA: Diagnosis not present

## 2012-10-08 DIAGNOSIS — D72829 Elevated white blood cell count, unspecified: Secondary | ICD-10-CM | POA: Diagnosis not present

## 2012-10-08 DIAGNOSIS — Z79899 Other long term (current) drug therapy: Secondary | ICD-10-CM | POA: Diagnosis not present

## 2012-10-08 DIAGNOSIS — M79609 Pain in unspecified limb: Secondary | ICD-10-CM | POA: Diagnosis not present

## 2012-10-08 DIAGNOSIS — L03317 Cellulitis of buttock: Secondary | ICD-10-CM | POA: Diagnosis not present

## 2012-10-08 DIAGNOSIS — A419 Sepsis, unspecified organism: Secondary | ICD-10-CM | POA: Diagnosis not present

## 2012-10-08 DIAGNOSIS — K5730 Diverticulosis of large intestine without perforation or abscess without bleeding: Secondary | ICD-10-CM | POA: Diagnosis present

## 2012-10-08 DIAGNOSIS — L0231 Cutaneous abscess of buttock: Secondary | ICD-10-CM | POA: Diagnosis not present

## 2012-10-08 DIAGNOSIS — L0291 Cutaneous abscess, unspecified: Secondary | ICD-10-CM | POA: Diagnosis not present

## 2012-10-08 DIAGNOSIS — R509 Fever, unspecified: Secondary | ICD-10-CM | POA: Diagnosis not present

## 2012-10-08 DIAGNOSIS — G2581 Restless legs syndrome: Secondary | ICD-10-CM | POA: Diagnosis not present

## 2012-10-08 DIAGNOSIS — L0390 Cellulitis, unspecified: Secondary | ICD-10-CM | POA: Diagnosis not present

## 2012-10-08 DIAGNOSIS — Z23 Encounter for immunization: Secondary | ICD-10-CM | POA: Diagnosis not present

## 2012-10-08 DIAGNOSIS — I10 Essential (primary) hypertension: Secondary | ICD-10-CM | POA: Diagnosis not present

## 2012-10-08 DIAGNOSIS — A4901 Methicillin susceptible Staphylococcus aureus infection, unspecified site: Secondary | ICD-10-CM | POA: Diagnosis not present

## 2012-10-08 DIAGNOSIS — A4902 Methicillin resistant Staphylococcus aureus infection, unspecified site: Secondary | ICD-10-CM | POA: Diagnosis not present

## 2012-10-15 DIAGNOSIS — L03317 Cellulitis of buttock: Secondary | ICD-10-CM | POA: Diagnosis not present

## 2012-10-15 DIAGNOSIS — L0231 Cutaneous abscess of buttock: Secondary | ICD-10-CM | POA: Diagnosis not present

## 2012-10-15 DIAGNOSIS — I10 Essential (primary) hypertension: Secondary | ICD-10-CM | POA: Diagnosis not present

## 2012-10-15 DIAGNOSIS — M069 Rheumatoid arthritis, unspecified: Secondary | ICD-10-CM | POA: Diagnosis not present

## 2012-10-16 DIAGNOSIS — L03317 Cellulitis of buttock: Secondary | ICD-10-CM | POA: Diagnosis not present

## 2012-10-16 DIAGNOSIS — M069 Rheumatoid arthritis, unspecified: Secondary | ICD-10-CM | POA: Diagnosis not present

## 2012-10-16 DIAGNOSIS — I10 Essential (primary) hypertension: Secondary | ICD-10-CM | POA: Diagnosis not present

## 2012-10-16 DIAGNOSIS — L0231 Cutaneous abscess of buttock: Secondary | ICD-10-CM | POA: Diagnosis not present

## 2012-10-17 DIAGNOSIS — M069 Rheumatoid arthritis, unspecified: Secondary | ICD-10-CM | POA: Diagnosis not present

## 2012-10-17 DIAGNOSIS — I10 Essential (primary) hypertension: Secondary | ICD-10-CM | POA: Diagnosis not present

## 2012-10-17 DIAGNOSIS — L0231 Cutaneous abscess of buttock: Secondary | ICD-10-CM | POA: Diagnosis not present

## 2012-10-18 DIAGNOSIS — L03317 Cellulitis of buttock: Secondary | ICD-10-CM | POA: Diagnosis not present

## 2012-10-18 DIAGNOSIS — L0231 Cutaneous abscess of buttock: Secondary | ICD-10-CM | POA: Diagnosis not present

## 2012-10-18 DIAGNOSIS — M069 Rheumatoid arthritis, unspecified: Secondary | ICD-10-CM | POA: Diagnosis not present

## 2012-10-18 DIAGNOSIS — I10 Essential (primary) hypertension: Secondary | ICD-10-CM | POA: Diagnosis not present

## 2012-10-19 DIAGNOSIS — L0231 Cutaneous abscess of buttock: Secondary | ICD-10-CM | POA: Diagnosis not present

## 2012-10-19 DIAGNOSIS — I10 Essential (primary) hypertension: Secondary | ICD-10-CM | POA: Diagnosis not present

## 2012-10-19 DIAGNOSIS — M069 Rheumatoid arthritis, unspecified: Secondary | ICD-10-CM | POA: Diagnosis not present

## 2012-10-19 DIAGNOSIS — L03317 Cellulitis of buttock: Secondary | ICD-10-CM | POA: Diagnosis not present

## 2012-10-20 DIAGNOSIS — M069 Rheumatoid arthritis, unspecified: Secondary | ICD-10-CM | POA: Diagnosis not present

## 2012-10-20 DIAGNOSIS — L03317 Cellulitis of buttock: Secondary | ICD-10-CM | POA: Diagnosis not present

## 2012-10-20 DIAGNOSIS — I10 Essential (primary) hypertension: Secondary | ICD-10-CM | POA: Diagnosis not present

## 2012-10-20 DIAGNOSIS — L0231 Cutaneous abscess of buttock: Secondary | ICD-10-CM | POA: Diagnosis not present

## 2012-10-21 DIAGNOSIS — L0231 Cutaneous abscess of buttock: Secondary | ICD-10-CM | POA: Diagnosis not present

## 2012-10-21 DIAGNOSIS — M069 Rheumatoid arthritis, unspecified: Secondary | ICD-10-CM | POA: Diagnosis not present

## 2012-10-21 DIAGNOSIS — I10 Essential (primary) hypertension: Secondary | ICD-10-CM | POA: Diagnosis not present

## 2012-10-22 DIAGNOSIS — M069 Rheumatoid arthritis, unspecified: Secondary | ICD-10-CM | POA: Diagnosis not present

## 2012-10-22 DIAGNOSIS — L03317 Cellulitis of buttock: Secondary | ICD-10-CM | POA: Diagnosis not present

## 2012-10-22 DIAGNOSIS — I10 Essential (primary) hypertension: Secondary | ICD-10-CM | POA: Diagnosis not present

## 2012-10-22 DIAGNOSIS — L0231 Cutaneous abscess of buttock: Secondary | ICD-10-CM | POA: Diagnosis not present

## 2012-10-23 DIAGNOSIS — M069 Rheumatoid arthritis, unspecified: Secondary | ICD-10-CM | POA: Diagnosis not present

## 2012-10-23 DIAGNOSIS — I10 Essential (primary) hypertension: Secondary | ICD-10-CM | POA: Diagnosis not present

## 2012-10-23 DIAGNOSIS — L0231 Cutaneous abscess of buttock: Secondary | ICD-10-CM | POA: Diagnosis not present

## 2012-10-24 DIAGNOSIS — M069 Rheumatoid arthritis, unspecified: Secondary | ICD-10-CM | POA: Diagnosis not present

## 2012-10-24 DIAGNOSIS — L0231 Cutaneous abscess of buttock: Secondary | ICD-10-CM | POA: Diagnosis not present

## 2012-10-24 DIAGNOSIS — I10 Essential (primary) hypertension: Secondary | ICD-10-CM | POA: Diagnosis not present

## 2012-10-25 DIAGNOSIS — L03317 Cellulitis of buttock: Secondary | ICD-10-CM | POA: Diagnosis not present

## 2012-10-25 DIAGNOSIS — L0231 Cutaneous abscess of buttock: Secondary | ICD-10-CM | POA: Diagnosis not present

## 2012-10-25 DIAGNOSIS — I10 Essential (primary) hypertension: Secondary | ICD-10-CM | POA: Diagnosis not present

## 2012-10-25 DIAGNOSIS — M069 Rheumatoid arthritis, unspecified: Secondary | ICD-10-CM | POA: Diagnosis not present

## 2012-10-26 DIAGNOSIS — L03317 Cellulitis of buttock: Secondary | ICD-10-CM | POA: Diagnosis not present

## 2012-10-26 DIAGNOSIS — L0231 Cutaneous abscess of buttock: Secondary | ICD-10-CM | POA: Diagnosis not present

## 2012-10-26 DIAGNOSIS — M069 Rheumatoid arthritis, unspecified: Secondary | ICD-10-CM | POA: Diagnosis not present

## 2012-10-26 DIAGNOSIS — I10 Essential (primary) hypertension: Secondary | ICD-10-CM | POA: Diagnosis not present

## 2012-10-27 DIAGNOSIS — M069 Rheumatoid arthritis, unspecified: Secondary | ICD-10-CM | POA: Diagnosis not present

## 2012-10-27 DIAGNOSIS — I10 Essential (primary) hypertension: Secondary | ICD-10-CM | POA: Diagnosis not present

## 2012-10-27 DIAGNOSIS — L0231 Cutaneous abscess of buttock: Secondary | ICD-10-CM | POA: Diagnosis not present

## 2012-10-28 DIAGNOSIS — I10 Essential (primary) hypertension: Secondary | ICD-10-CM | POA: Diagnosis not present

## 2012-10-28 DIAGNOSIS — M069 Rheumatoid arthritis, unspecified: Secondary | ICD-10-CM | POA: Diagnosis not present

## 2012-10-28 DIAGNOSIS — L0231 Cutaneous abscess of buttock: Secondary | ICD-10-CM | POA: Diagnosis not present

## 2012-10-28 DIAGNOSIS — L03317 Cellulitis of buttock: Secondary | ICD-10-CM | POA: Diagnosis not present

## 2012-11-13 DIAGNOSIS — R300 Dysuria: Secondary | ICD-10-CM | POA: Diagnosis not present

## 2012-11-13 DIAGNOSIS — N390 Urinary tract infection, site not specified: Secondary | ICD-10-CM | POA: Diagnosis not present

## 2012-12-17 DIAGNOSIS — N390 Urinary tract infection, site not specified: Secondary | ICD-10-CM | POA: Diagnosis not present

## 2012-12-28 DIAGNOSIS — R634 Abnormal weight loss: Secondary | ICD-10-CM | POA: Diagnosis not present

## 2012-12-28 DIAGNOSIS — R7301 Impaired fasting glucose: Secondary | ICD-10-CM | POA: Diagnosis not present

## 2012-12-28 DIAGNOSIS — E782 Mixed hyperlipidemia: Secondary | ICD-10-CM | POA: Diagnosis not present

## 2012-12-28 DIAGNOSIS — I5030 Unspecified diastolic (congestive) heart failure: Secondary | ICD-10-CM | POA: Diagnosis not present

## 2012-12-28 DIAGNOSIS — Z79899 Other long term (current) drug therapy: Secondary | ICD-10-CM | POA: Diagnosis not present

## 2012-12-28 DIAGNOSIS — Z1289 Encounter for screening for malignant neoplasm of other sites: Secondary | ICD-10-CM | POA: Diagnosis not present

## 2012-12-28 DIAGNOSIS — I10 Essential (primary) hypertension: Secondary | ICD-10-CM | POA: Diagnosis not present

## 2012-12-28 DIAGNOSIS — E7800 Pure hypercholesterolemia, unspecified: Secondary | ICD-10-CM | POA: Diagnosis not present

## 2012-12-28 DIAGNOSIS — M549 Dorsalgia, unspecified: Secondary | ICD-10-CM | POA: Diagnosis not present

## 2012-12-28 DIAGNOSIS — Z1239 Encounter for other screening for malignant neoplasm of breast: Secondary | ICD-10-CM | POA: Diagnosis not present

## 2012-12-28 DIAGNOSIS — E559 Vitamin D deficiency, unspecified: Secondary | ICD-10-CM | POA: Diagnosis not present

## 2012-12-29 DIAGNOSIS — N390 Urinary tract infection, site not specified: Secondary | ICD-10-CM | POA: Diagnosis not present

## 2013-01-08 DIAGNOSIS — Z1231 Encounter for screening mammogram for malignant neoplasm of breast: Secondary | ICD-10-CM | POA: Diagnosis not present

## 2013-01-22 DIAGNOSIS — N3090 Cystitis, unspecified without hematuria: Secondary | ICD-10-CM | POA: Diagnosis not present

## 2013-01-22 DIAGNOSIS — N3289 Other specified disorders of bladder: Secondary | ICD-10-CM | POA: Diagnosis not present

## 2013-01-22 DIAGNOSIS — N952 Postmenopausal atrophic vaginitis: Secondary | ICD-10-CM | POA: Diagnosis not present

## 2013-01-25 DIAGNOSIS — R634 Abnormal weight loss: Secondary | ICD-10-CM | POA: Diagnosis not present

## 2013-02-16 DIAGNOSIS — B351 Tinea unguium: Secondary | ICD-10-CM | POA: Diagnosis not present

## 2013-02-16 DIAGNOSIS — R609 Edema, unspecified: Secondary | ICD-10-CM | POA: Diagnosis not present

## 2013-02-16 DIAGNOSIS — I8310 Varicose veins of unspecified lower extremity with inflammation: Secondary | ICD-10-CM | POA: Diagnosis not present

## 2013-02-16 DIAGNOSIS — B353 Tinea pedis: Secondary | ICD-10-CM | POA: Diagnosis not present

## 2013-03-03 DIAGNOSIS — N390 Urinary tract infection, site not specified: Secondary | ICD-10-CM | POA: Diagnosis not present

## 2013-03-12 DIAGNOSIS — M069 Rheumatoid arthritis, unspecified: Secondary | ICD-10-CM | POA: Diagnosis not present

## 2013-03-24 DIAGNOSIS — K5900 Constipation, unspecified: Secondary | ICD-10-CM | POA: Diagnosis not present

## 2013-03-24 DIAGNOSIS — N3090 Cystitis, unspecified without hematuria: Secondary | ICD-10-CM | POA: Diagnosis not present

## 2013-03-25 DIAGNOSIS — B351 Tinea unguium: Secondary | ICD-10-CM | POA: Diagnosis not present

## 2013-03-25 DIAGNOSIS — B353 Tinea pedis: Secondary | ICD-10-CM | POA: Diagnosis not present

## 2013-03-29 DIAGNOSIS — B351 Tinea unguium: Secondary | ICD-10-CM | POA: Diagnosis not present

## 2013-03-29 DIAGNOSIS — B353 Tinea pedis: Secondary | ICD-10-CM | POA: Diagnosis not present

## 2013-04-23 DIAGNOSIS — N390 Urinary tract infection, site not specified: Secondary | ICD-10-CM | POA: Diagnosis not present

## 2013-05-19 DIAGNOSIS — E782 Mixed hyperlipidemia: Secondary | ICD-10-CM | POA: Diagnosis not present

## 2013-05-19 DIAGNOSIS — I5030 Unspecified diastolic (congestive) heart failure: Secondary | ICD-10-CM | POA: Diagnosis not present

## 2013-05-19 DIAGNOSIS — F5102 Adjustment insomnia: Secondary | ICD-10-CM | POA: Diagnosis not present

## 2013-05-19 DIAGNOSIS — M549 Dorsalgia, unspecified: Secondary | ICD-10-CM | POA: Diagnosis not present

## 2013-05-19 DIAGNOSIS — R7301 Impaired fasting glucose: Secondary | ICD-10-CM | POA: Diagnosis not present

## 2013-05-19 DIAGNOSIS — I10 Essential (primary) hypertension: Secondary | ICD-10-CM | POA: Diagnosis not present

## 2013-05-24 DIAGNOSIS — N390 Urinary tract infection, site not specified: Secondary | ICD-10-CM | POA: Diagnosis not present

## 2013-06-04 DIAGNOSIS — I10 Essential (primary) hypertension: Secondary | ICD-10-CM | POA: Diagnosis not present

## 2013-06-04 DIAGNOSIS — R61 Generalized hyperhidrosis: Secondary | ICD-10-CM | POA: Diagnosis not present

## 2013-06-24 DIAGNOSIS — N390 Urinary tract infection, site not specified: Secondary | ICD-10-CM | POA: Diagnosis not present

## 2013-06-24 DIAGNOSIS — K5900 Constipation, unspecified: Secondary | ICD-10-CM | POA: Diagnosis not present

## 2013-06-24 DIAGNOSIS — N3090 Cystitis, unspecified without hematuria: Secondary | ICD-10-CM | POA: Diagnosis not present

## 2013-06-29 DIAGNOSIS — N390 Urinary tract infection, site not specified: Secondary | ICD-10-CM | POA: Diagnosis not present

## 2013-07-09 DIAGNOSIS — Z23 Encounter for immunization: Secondary | ICD-10-CM | POA: Diagnosis not present

## 2013-07-09 DIAGNOSIS — T148 Other injury of unspecified body region: Secondary | ICD-10-CM | POA: Diagnosis not present

## 2013-07-13 DIAGNOSIS — M79609 Pain in unspecified limb: Secondary | ICD-10-CM | POA: Diagnosis not present

## 2013-07-13 DIAGNOSIS — M069 Rheumatoid arthritis, unspecified: Secondary | ICD-10-CM | POA: Diagnosis not present

## 2013-07-27 DIAGNOSIS — M25519 Pain in unspecified shoulder: Secondary | ICD-10-CM | POA: Diagnosis not present

## 2013-07-27 DIAGNOSIS — M069 Rheumatoid arthritis, unspecified: Secondary | ICD-10-CM | POA: Diagnosis not present

## 2013-07-27 DIAGNOSIS — M79609 Pain in unspecified limb: Secondary | ICD-10-CM | POA: Diagnosis not present

## 2013-08-09 DIAGNOSIS — R61 Generalized hyperhidrosis: Secondary | ICD-10-CM | POA: Diagnosis not present

## 2013-08-09 DIAGNOSIS — K219 Gastro-esophageal reflux disease without esophagitis: Secondary | ICD-10-CM | POA: Diagnosis not present

## 2013-08-09 DIAGNOSIS — R079 Chest pain, unspecified: Secondary | ICD-10-CM | POA: Diagnosis not present

## 2013-08-09 DIAGNOSIS — R197 Diarrhea, unspecified: Secondary | ICD-10-CM | POA: Diagnosis not present

## 2013-08-09 DIAGNOSIS — R82998 Other abnormal findings in urine: Secondary | ICD-10-CM | POA: Diagnosis not present

## 2013-08-09 DIAGNOSIS — R0789 Other chest pain: Secondary | ICD-10-CM | POA: Diagnosis not present

## 2013-08-09 DIAGNOSIS — R5381 Other malaise: Secondary | ICD-10-CM | POA: Diagnosis not present

## 2013-08-12 DIAGNOSIS — R197 Diarrhea, unspecified: Secondary | ICD-10-CM | POA: Diagnosis not present

## 2013-08-23 ENCOUNTER — Emergency Department (HOSPITAL_COMMUNITY)
Admission: EM | Admit: 2013-08-23 | Discharge: 2013-08-24 | Disposition: A | Discharge status: Home / Self Care | Payer: Medicare Other | Attending: Emergency Medicine | Admitting: Emergency Medicine

## 2013-08-23 ENCOUNTER — Encounter (HOSPITAL_COMMUNITY): Payer: Self-pay | Admitting: Emergency Medicine

## 2013-08-23 DIAGNOSIS — F329 Major depressive disorder, single episode, unspecified: Secondary | ICD-10-CM | POA: Insufficient documentation

## 2013-08-23 DIAGNOSIS — J4489 Other specified chronic obstructive pulmonary disease: Secondary | ICD-10-CM | POA: Insufficient documentation

## 2013-08-23 DIAGNOSIS — R6883 Chills (without fever): Secondary | ICD-10-CM | POA: Diagnosis not present

## 2013-08-23 DIAGNOSIS — R61 Generalized hyperhidrosis: Secondary | ICD-10-CM | POA: Diagnosis not present

## 2013-08-23 DIAGNOSIS — Z8679 Personal history of other diseases of the circulatory system: Secondary | ICD-10-CM | POA: Insufficient documentation

## 2013-08-23 DIAGNOSIS — Z791 Long term (current) use of non-steroidal anti-inflammatories (NSAID): Secondary | ICD-10-CM | POA: Diagnosis not present

## 2013-08-23 DIAGNOSIS — M1990 Unspecified osteoarthritis, unspecified site: Secondary | ICD-10-CM | POA: Diagnosis not present

## 2013-08-23 DIAGNOSIS — G4733 Obstructive sleep apnea (adult) (pediatric): Secondary | ICD-10-CM | POA: Insufficient documentation

## 2013-08-23 DIAGNOSIS — IMO0002 Reserved for concepts with insufficient information to code with codable children: Secondary | ICD-10-CM | POA: Diagnosis not present

## 2013-08-23 DIAGNOSIS — R3589 Other polyuria: Secondary | ICD-10-CM | POA: Diagnosis not present

## 2013-08-23 DIAGNOSIS — R5381 Other malaise: Secondary | ICD-10-CM | POA: Diagnosis not present

## 2013-08-23 DIAGNOSIS — J449 Chronic obstructive pulmonary disease, unspecified: Secondary | ICD-10-CM | POA: Insufficient documentation

## 2013-08-23 DIAGNOSIS — Z8614 Personal history of Methicillin resistant Staphylococcus aureus infection: Secondary | ICD-10-CM | POA: Insufficient documentation

## 2013-08-23 DIAGNOSIS — M069 Rheumatoid arthritis, unspecified: Secondary | ICD-10-CM | POA: Diagnosis not present

## 2013-08-23 DIAGNOSIS — F3289 Other specified depressive episodes: Secondary | ICD-10-CM | POA: Insufficient documentation

## 2013-08-23 DIAGNOSIS — N390 Urinary tract infection, site not specified: Secondary | ICD-10-CM | POA: Insufficient documentation

## 2013-08-23 DIAGNOSIS — Z792 Long term (current) use of antibiotics: Secondary | ICD-10-CM | POA: Insufficient documentation

## 2013-08-23 DIAGNOSIS — R358 Other polyuria: Secondary | ICD-10-CM | POA: Diagnosis not present

## 2013-08-23 DIAGNOSIS — E785 Hyperlipidemia, unspecified: Secondary | ICD-10-CM | POA: Diagnosis not present

## 2013-08-23 DIAGNOSIS — Z79899 Other long term (current) drug therapy: Secondary | ICD-10-CM | POA: Insufficient documentation

## 2013-08-23 LAB — CBC WITH DIFFERENTIAL/PLATELET
Eosinophils Relative: 4 % (ref 0–5)
HCT: 39.5 % (ref 36.0–46.0)
Hemoglobin: 12.9 g/dL (ref 12.0–15.0)
Lymphocytes Relative: 20 % (ref 12–46)
Lymphs Abs: 1.5 10*3/uL (ref 0.7–4.0)
MCV: 87.8 fL (ref 78.0–100.0)
Monocytes Absolute: 0.6 10*3/uL (ref 0.1–1.0)
Monocytes Relative: 9 % (ref 3–12)
Neutro Abs: 5.1 10*3/uL (ref 1.7–7.7)
RBC: 4.5 MIL/uL (ref 3.87–5.11)
WBC: 7.6 10*3/uL (ref 4.0–10.5)

## 2013-08-23 LAB — COMPREHENSIVE METABOLIC PANEL
CO2: 26 mEq/L (ref 19–32)
Calcium: 9.3 mg/dL (ref 8.4–10.5)
Chloride: 106 mEq/L (ref 96–112)
Creatinine, Ser: 0.77 mg/dL (ref 0.50–1.10)
GFR calc Af Amer: >90 mL/min (ref >90)
GFR calc non Af Amer: 83 mL/min — ABNORMAL LOW (ref >90)
Glucose, Bld: 90 mg/dL (ref 70–99)
Total Bilirubin: 0.4 mg/dL (ref 0.3–1.2)

## 2013-08-23 LAB — URINALYSIS, ROUTINE W REFLEX MICROSCOPIC
Glucose, UA: NEGATIVE mg/dL
Hgb urine dipstick: NEGATIVE
Protein, ur: NEGATIVE mg/dL
Specific Gravity, Urine: 1.017 (ref 1.005–1.030)
pH: 7 (ref 5.0–8.0)

## 2013-08-23 LAB — URINE MICROSCOPIC-ADD ON

## 2013-08-23 MED ORDER — ROPINIROLE HCL 1 MG PO TABS
1.0000 mg | ORAL_TABLET | Freq: Every day | ORAL | Status: DC
  Administered 2013-08-23: 1 mg via ORAL
  Filled 2013-08-23: qty 1

## 2013-08-23 NOTE — ED Notes (Addendum)
Pt sts chills and sweats and not feeling well x 3 weeks; pt sts weaning herself of MS contin and last took 1 week ago; pt denies fever when taking temperature but sts feels chilled; pt unsure if could be in withdrawl

## 2013-08-24 LAB — URINE CULTURE
Colony Count: NO GROWTH
Culture: NO GROWTH

## 2013-08-24 LAB — C-REACTIVE PROTEIN: CRP: <0.5 mg/dL — ABNORMAL LOW (ref <0.60)

## 2013-08-24 LAB — SEDIMENTATION RATE: Sed Rate: 12 mm/hr (ref 0–22)

## 2013-08-24 MED ORDER — CEPHALEXIN 500 MG PO TABS: 500.0000 mg | ORAL_TABLET | Freq: Four times a day (QID) | ORAL | Status: DC

## 2013-08-24 MED ORDER — CEPHALEXIN 250 MG PO CAPS
500.0000 mg | ORAL_CAPSULE | Freq: Once | ORAL | Status: AC
  Administered 2013-08-24: 500 mg via ORAL

## 2013-08-24 MED ORDER — CEPHALEXIN 250 MG PO CAPS
ORAL_CAPSULE | ORAL | Status: AC
  Filled 2013-08-24: qty 2

## 2013-08-24 MED ORDER — CEPHALEXIN 500 MG PO TABS: 500.0000 mg | ORAL_TABLET | Freq: Two times a day (BID) | ORAL | Status: DC

## 2013-08-24 NOTE — ED Provider Notes (Signed)
CSN: [id number]     Arrival date & time 08/23/13  1807 History   First MD Initiated Contact with Patient 08/23/13 2256     Chief Complaint  Patient presents with  . Chills   (Consider location/radiation/quality/duration/timing/severity/associated sxs/prior Treatment) HPI 72 year old female presents to emergency room with complaint of chills and sweats ongoing for last month.  She reports initially just night sweats, but for the last week she has had increasing hot and cold flashes during the day.  She's had no fevers associated with the symptoms.  Patient reports she recently over the last month began weaning herself off her MS Contin.  She has not had any opiates over the last 3-4 days.  Patient reports that she was on MS Contin for less than a year.  Patient has been seen by her primary care doctor, Dr. Tobie Poet in [place].  EKG, chest x-ray, and blood work was done about 2 weeks ago.  Patient was seen again today by her primary care Dr. who did more  blood work.  She does not know the results of today's blood work.  She is unsure if blood cultures have been sent.  She reports that she was checked for urinary tract infection, and that has returned negative.  She reports previous history of similar symptoms in the past been attributed to MRSA infection, and methotrexate.  Scarring of her lungs.  She has no current rash or boils.  She has no cough.  She has no sinus pressure.  No URI symptoms.  No abdominal pain.  No urinary symptoms.  No new medications.  Patient had steroid injection and prednisone.  About 2 weeks ago.  This was done through her rheumatologist.  She has history of rheumatoid arthritis.  Patient presents to emergency department tonight because she had worsening symptoms, and "wanted to get things checked out.". Past Medical History  Diagnosis Date  . RLS (restless legs syndrome)   . COPD (chronic obstructive pulmonary disease)   . Depression   . Osteoarthritis   . Asthma   .  Hyperlipemia   . Chronic bronchitis   . Rheumatoid arthritis(714.0)   . History of migraine headaches   . Acute renal insufficiency   . OSA (obstructive sleep apnea)     on cpap   Past Surgical History  Procedure Laterality Date  . Total abdominal hysterectomy  06/1988   Family History  Problem Relation Age of Onset  . Stroke Mother   . Hypertension Mother   . Diabetes Mother   . Lung cancer Father   . Rheum arthritis Sister   . Rheum arthritis Sister    History  Substance Use Topics  . Smoking status: Never Smoker   . Smokeless tobacco: Not on file  . Alcohol Use: No   OB History   Grav Para Term Preterm Abortions TAB SAB Ect Mult Living                 Review of Systems  See History of Present Illness; otherwise all other systems are reviewed and negative Allergies  Remicade  Home Medications   Current Outpatient Rx  Name  Route  Sig  Dispense  Refill  . albuterol (VENTOLIN HFA) 108 (90 BASE) MCG/ACT inhaler   Inhalation   Inhale 1-2 puffs into the lungs every 4 (four) hours as needed for shortness of breath.          Marland Kitchen amLODipine (NORVASC) 5 MG tablet   Oral   Take 5 mg  by mouth daily.           Marland Kitchen atorvastatin (LIPITOR) 20 MG tablet   Oral   Take 1 tablet by mouth at bedtime.          . Cholecalciferol (VITAMIN D3) 2000 UNITS TABS   Oral   Take 1 tablet by mouth daily.         Marland Kitchen leflunomide (ARAVA) 10 MG tablet   Oral   Take 10 mg by mouth daily.           . Mometasone Furo-Formoterol Fum 200-5 MCG/ACT AERO   Inhalation   Inhale 1 puff into the lungs 2 (two) times daily.         . nabumetone (RELAFEN) 750 MG tablet   Oral   Take 750 mg by mouth 2 (two) times daily.           . pantoprazole (PROTONIX) 40 MG tablet   Oral   Take 40 mg by mouth 2 (two) times daily.          Marland Kitchen PARoxetine (PAXIL) 20 MG tablet   Oral   Take 20 mg by mouth at bedtime.          Marland Kitchen rOPINIRole (REQUIP) 1 MG tablet      2 every morning and 3 at  bedtime         . zolpidem (AMBIEN CR) 12.5 MG CR tablet   Oral   Take 12.5 mg by mouth at bedtime.           . Cephalexin 500 MG tablet   Oral   Take 1 tablet (500 mg total) by mouth 4 (four) times daily.   20 tablet   0    BP 153/68  Pulse 75  Temp(Src) 98.1 F (36.7 C) (Oral)  Resp 22  Ht 5\' 2"  (1.575 m)  Wt 176 lb 6.4 oz (80.015 kg)  BMI 32.26 kg/m2  SpO2 94% Physical Exam  Nursing note and vitals reviewed. Constitutional: She is oriented to person, place, and time. She appears well-developed and well-nourished. She appears distressed.  HENT:  Head: Normocephalic and atraumatic.  Right Ear: External ear normal.  Left Ear: External ear normal.  Nose: Nose normal.  Mouth/Throat: Oropharynx is clear and moist.  Eyes: Conjunctivae and EOM are normal. Pupils are equal, round, and reactive to light.  Neck: Normal range of motion. Neck supple. No JVD present. No tracheal deviation present. No thyromegaly present.  Cardiovascular: Normal rate, regular rhythm, normal heart sounds and intact distal pulses.  Exam reveals no gallop and no friction rub.   No murmur heard. Pulmonary/Chest: Effort normal and breath sounds normal. No stridor. No respiratory distress. She has no wheezes. She has no rales. She exhibits no tenderness.  Abdominal: Soft. Bowel sounds are normal. She exhibits no distension and no mass. There is no tenderness. There is no rebound and no guarding.  Musculoskeletal: Normal range of motion. She exhibits no edema and no tenderness.  Lymphadenopathy:    She has no cervical adenopathy.  Neurological: She is alert and oriented to person, place, and time. She exhibits normal muscle tone. Coordination normal.  Skin: Skin is warm and dry. No rash noted. No erythema. No pallor.  Psychiatric: She has a normal mood and affect. Her behavior is normal. Judgment and thought content normal.    ED Course  Procedures (including critical care time) Labs Review Labs  Reviewed  URINALYSIS, ROUTINE W REFLEX MICROSCOPIC - Abnormal; Notable for the following:  Leukocytes, UA MODERATE (*)    All other components within normal limits  COMPREHENSIVE METABOLIC PANEL - Abnormal; Notable for the following:    BUN 24 (*)    GFR calc non Af Amer 83 (*)    All other components within normal limits  URINE MICROSCOPIC-ADD ON - Abnormal; Notable for the following:    Squamous Epithelial / LPF FEW (*)    Bacteria, UA FEW (*)    All other components within normal limits  URINE CULTURE  CULTURE, BLOOD (ROUTINE X 2)  CULTURE, BLOOD (ROUTINE X 2)  CBC WITH DIFFERENTIAL  SEDIMENTATION RATE  C-REACTIVE PROTEIN   Imaging Review No results found.  EKG Interpretation   None       MDM   1. Chills (without fever)   2. Night sweats   3. Urinary tract infection    72 year old female with ongoing chills.  For the last month.  Unable to find any specific source.  She does have slight urinary tract infection, we will treat with Keflex.  Patient has been encouraged to followup with her primary care doctor, as well as her rheumatologist for further workup.  Blood cultures were sent.   Kalman Drape, MD 08/24/13 [phone]

## 2013-08-30 LAB — CULTURE, BLOOD (ROUTINE X 2): Culture: NO GROWTH

## 2013-09-01 DIAGNOSIS — R5381 Other malaise: Secondary | ICD-10-CM | POA: Diagnosis not present

## 2013-09-01 DIAGNOSIS — M549 Dorsalgia, unspecified: Secondary | ICD-10-CM | POA: Diagnosis not present

## 2013-09-01 DIAGNOSIS — F5102 Adjustment insomnia: Secondary | ICD-10-CM | POA: Diagnosis not present

## 2013-09-01 DIAGNOSIS — R1013 Epigastric pain: Secondary | ICD-10-CM | POA: Diagnosis not present

## 2013-09-01 DIAGNOSIS — E782 Mixed hyperlipidemia: Secondary | ICD-10-CM | POA: Diagnosis not present

## 2013-09-01 DIAGNOSIS — R7309 Other abnormal glucose: Secondary | ICD-10-CM | POA: Diagnosis not present

## 2013-09-01 DIAGNOSIS — M069 Rheumatoid arthritis, unspecified: Secondary | ICD-10-CM | POA: Diagnosis not present

## 2013-09-01 DIAGNOSIS — I10 Essential (primary) hypertension: Secondary | ICD-10-CM | POA: Diagnosis not present

## 2013-09-01 DIAGNOSIS — R7301 Impaired fasting glucose: Secondary | ICD-10-CM | POA: Diagnosis not present

## 2013-09-07 DIAGNOSIS — N390 Urinary tract infection, site not specified: Secondary | ICD-10-CM | POA: Diagnosis not present

## 2013-09-20 DIAGNOSIS — K219 Gastro-esophageal reflux disease without esophagitis: Secondary | ICD-10-CM | POA: Diagnosis not present

## 2013-09-20 DIAGNOSIS — G2581 Restless legs syndrome: Secondary | ICD-10-CM | POA: Diagnosis not present

## 2013-09-20 DIAGNOSIS — F341 Dysthymic disorder: Secondary | ICD-10-CM | POA: Diagnosis not present

## 2013-09-20 DIAGNOSIS — M069 Rheumatoid arthritis, unspecified: Secondary | ICD-10-CM | POA: Diagnosis not present

## 2013-09-20 DIAGNOSIS — I10 Essential (primary) hypertension: Secondary | ICD-10-CM | POA: Diagnosis not present

## 2013-09-20 DIAGNOSIS — R1013 Epigastric pain: Secondary | ICD-10-CM | POA: Diagnosis not present

## 2013-09-20 DIAGNOSIS — F5102 Adjustment insomnia: Secondary | ICD-10-CM | POA: Diagnosis not present

## 2013-09-21 DIAGNOSIS — H26499 Other secondary cataract, unspecified eye: Secondary | ICD-10-CM | POA: Diagnosis not present

## 2013-09-27 DIAGNOSIS — K219 Gastro-esophageal reflux disease without esophagitis: Secondary | ICD-10-CM | POA: Diagnosis not present

## 2013-09-27 DIAGNOSIS — R1310 Dysphagia, unspecified: Secondary | ICD-10-CM | POA: Diagnosis not present

## 2013-09-27 DIAGNOSIS — R1013 Epigastric pain: Secondary | ICD-10-CM | POA: Diagnosis not present

## 2013-10-11 DIAGNOSIS — K219 Gastro-esophageal reflux disease without esophagitis: Secondary | ICD-10-CM | POA: Diagnosis not present

## 2013-10-11 DIAGNOSIS — R1013 Epigastric pain: Secondary | ICD-10-CM | POA: Diagnosis not present

## 2013-10-12 DIAGNOSIS — M545 Low back pain, unspecified: Secondary | ICD-10-CM | POA: Diagnosis not present

## 2013-10-15 DIAGNOSIS — H35369 Drusen (degenerative) of macula, unspecified eye: Secondary | ICD-10-CM | POA: Diagnosis not present

## 2013-10-15 DIAGNOSIS — H2510 Age-related nuclear cataract, unspecified eye: Secondary | ICD-10-CM | POA: Diagnosis not present

## 2013-10-21 DIAGNOSIS — F341 Dysthymic disorder: Secondary | ICD-10-CM | POA: Diagnosis not present

## 2013-10-21 DIAGNOSIS — K219 Gastro-esophageal reflux disease without esophagitis: Secondary | ICD-10-CM | POA: Diagnosis not present

## 2013-10-21 DIAGNOSIS — Z79899 Other long term (current) drug therapy: Secondary | ICD-10-CM | POA: Diagnosis not present

## 2013-10-21 DIAGNOSIS — J984 Other disorders of lung: Secondary | ICD-10-CM | POA: Diagnosis not present

## 2013-10-21 DIAGNOSIS — R1013 Epigastric pain: Secondary | ICD-10-CM | POA: Diagnosis not present

## 2013-10-25 DIAGNOSIS — N952 Postmenopausal atrophic vaginitis: Secondary | ICD-10-CM | POA: Diagnosis not present

## 2013-10-25 DIAGNOSIS — N3090 Cystitis, unspecified without hematuria: Secondary | ICD-10-CM | POA: Diagnosis not present

## 2013-10-26 DIAGNOSIS — R911 Solitary pulmonary nodule: Secondary | ICD-10-CM | POA: Diagnosis not present

## 2013-10-26 DIAGNOSIS — J984 Other disorders of lung: Secondary | ICD-10-CM | POA: Diagnosis not present

## 2013-10-26 DIAGNOSIS — J8410 Pulmonary fibrosis, unspecified: Secondary | ICD-10-CM | POA: Diagnosis not present

## 2013-10-27 DIAGNOSIS — H5710 Ocular pain, unspecified eye: Secondary | ICD-10-CM | POA: Diagnosis not present

## 2013-10-27 DIAGNOSIS — M19019 Primary osteoarthritis, unspecified shoulder: Secondary | ICD-10-CM | POA: Diagnosis not present

## 2013-10-27 DIAGNOSIS — J069 Acute upper respiratory infection, unspecified: Secondary | ICD-10-CM | POA: Diagnosis not present

## 2013-11-16 DIAGNOSIS — Z13820 Encounter for screening for osteoporosis: Secondary | ICD-10-CM | POA: Diagnosis not present

## 2013-11-16 DIAGNOSIS — M069 Rheumatoid arthritis, unspecified: Secondary | ICD-10-CM | POA: Diagnosis not present

## 2013-11-23 DIAGNOSIS — F3289 Other specified depressive episodes: Secondary | ICD-10-CM | POA: Diagnosis not present

## 2013-11-23 DIAGNOSIS — J45909 Unspecified asthma, uncomplicated: Secondary | ICD-10-CM | POA: Diagnosis not present

## 2013-11-23 DIAGNOSIS — Z79899 Other long term (current) drug therapy: Secondary | ICD-10-CM | POA: Diagnosis not present

## 2013-11-23 DIAGNOSIS — G4730 Sleep apnea, unspecified: Secondary | ICD-10-CM | POA: Diagnosis not present

## 2013-11-23 DIAGNOSIS — I10 Essential (primary) hypertension: Secondary | ICD-10-CM | POA: Diagnosis not present

## 2013-11-23 DIAGNOSIS — H2589 Other age-related cataract: Secondary | ICD-10-CM | POA: Diagnosis not present

## 2013-11-23 DIAGNOSIS — H2510 Age-related nuclear cataract, unspecified eye: Secondary | ICD-10-CM | POA: Diagnosis not present

## 2013-11-23 DIAGNOSIS — H259 Unspecified age-related cataract: Secondary | ICD-10-CM | POA: Diagnosis not present

## 2013-11-23 DIAGNOSIS — F329 Major depressive disorder, single episode, unspecified: Secondary | ICD-10-CM | POA: Diagnosis not present

## 2013-12-06 DIAGNOSIS — N390 Urinary tract infection, site not specified: Secondary | ICD-10-CM | POA: Diagnosis not present

## 2013-12-15 DIAGNOSIS — R109 Unspecified abdominal pain: Secondary | ICD-10-CM | POA: Diagnosis not present

## 2013-12-15 DIAGNOSIS — R1013 Epigastric pain: Secondary | ICD-10-CM | POA: Diagnosis not present

## 2013-12-16 DIAGNOSIS — E559 Vitamin D deficiency, unspecified: Secondary | ICD-10-CM | POA: Diagnosis not present

## 2013-12-16 DIAGNOSIS — I10 Essential (primary) hypertension: Secondary | ICD-10-CM | POA: Diagnosis not present

## 2013-12-16 DIAGNOSIS — M069 Rheumatoid arthritis, unspecified: Secondary | ICD-10-CM | POA: Diagnosis not present

## 2013-12-16 DIAGNOSIS — E7800 Pure hypercholesterolemia, unspecified: Secondary | ICD-10-CM | POA: Diagnosis not present

## 2013-12-16 DIAGNOSIS — R7301 Impaired fasting glucose: Secondary | ICD-10-CM | POA: Diagnosis not present

## 2013-12-16 DIAGNOSIS — E782 Mixed hyperlipidemia: Secondary | ICD-10-CM | POA: Diagnosis not present

## 2013-12-16 DIAGNOSIS — R7309 Other abnormal glucose: Secondary | ICD-10-CM | POA: Diagnosis not present

## 2014-02-04 DIAGNOSIS — Z1231 Encounter for screening mammogram for malignant neoplasm of breast: Secondary | ICD-10-CM | POA: Diagnosis not present

## 2014-02-15 DIAGNOSIS — M48061 Spinal stenosis, lumbar region without neurogenic claudication: Secondary | ICD-10-CM | POA: Diagnosis not present

## 2014-02-15 DIAGNOSIS — M2550 Pain in unspecified joint: Secondary | ICD-10-CM | POA: Diagnosis not present

## 2014-02-15 DIAGNOSIS — M069 Rheumatoid arthritis, unspecified: Secondary | ICD-10-CM | POA: Diagnosis not present

## 2014-02-23 DIAGNOSIS — N952 Postmenopausal atrophic vaginitis: Secondary | ICD-10-CM | POA: Diagnosis not present

## 2014-02-23 DIAGNOSIS — N3090 Cystitis, unspecified without hematuria: Secondary | ICD-10-CM | POA: Diagnosis not present

## 2014-03-03 DIAGNOSIS — N390 Urinary tract infection, site not specified: Secondary | ICD-10-CM | POA: Diagnosis not present

## 2014-03-31 DIAGNOSIS — M069 Rheumatoid arthritis, unspecified: Secondary | ICD-10-CM | POA: Diagnosis not present

## 2014-03-31 DIAGNOSIS — E782 Mixed hyperlipidemia: Secondary | ICD-10-CM | POA: Diagnosis not present

## 2014-03-31 DIAGNOSIS — E7800 Pure hypercholesterolemia, unspecified: Secondary | ICD-10-CM | POA: Diagnosis not present

## 2014-03-31 DIAGNOSIS — R7301 Impaired fasting glucose: Secondary | ICD-10-CM | POA: Diagnosis not present

## 2014-03-31 DIAGNOSIS — I10 Essential (primary) hypertension: Secondary | ICD-10-CM | POA: Diagnosis not present

## 2014-03-31 DIAGNOSIS — R7309 Other abnormal glucose: Secondary | ICD-10-CM | POA: Diagnosis not present

## 2014-04-01 DIAGNOSIS — N390 Urinary tract infection, site not specified: Secondary | ICD-10-CM | POA: Diagnosis not present

## 2014-04-26 DIAGNOSIS — M1710 Unilateral primary osteoarthritis, unspecified knee: Secondary | ICD-10-CM | POA: Diagnosis not present

## 2014-04-29 DIAGNOSIS — M2550 Pain in unspecified joint: Secondary | ICD-10-CM | POA: Diagnosis not present

## 2014-04-29 DIAGNOSIS — M48061 Spinal stenosis, lumbar region without neurogenic claudication: Secondary | ICD-10-CM | POA: Diagnosis not present

## 2014-04-29 DIAGNOSIS — M069 Rheumatoid arthritis, unspecified: Secondary | ICD-10-CM | POA: Diagnosis not present

## 2014-05-25 DIAGNOSIS — M069 Rheumatoid arthritis, unspecified: Secondary | ICD-10-CM | POA: Diagnosis not present

## 2014-06-20 DIAGNOSIS — N390 Urinary tract infection, site not specified: Secondary | ICD-10-CM | POA: Diagnosis not present

## 2014-06-27 DIAGNOSIS — N3090 Cystitis, unspecified without hematuria: Secondary | ICD-10-CM | POA: Diagnosis not present

## 2014-06-27 DIAGNOSIS — N952 Postmenopausal atrophic vaginitis: Secondary | ICD-10-CM | POA: Diagnosis not present

## 2014-07-13 DIAGNOSIS — E782 Mixed hyperlipidemia: Secondary | ICD-10-CM | POA: Diagnosis not present

## 2014-07-13 DIAGNOSIS — E7800 Pure hypercholesterolemia, unspecified: Secondary | ICD-10-CM | POA: Diagnosis not present

## 2014-07-13 DIAGNOSIS — B028 Zoster with other complications: Secondary | ICD-10-CM | POA: Diagnosis not present

## 2014-07-13 DIAGNOSIS — I10 Essential (primary) hypertension: Secondary | ICD-10-CM | POA: Diagnosis not present

## 2014-07-13 DIAGNOSIS — R7301 Impaired fasting glucose: Secondary | ICD-10-CM | POA: Diagnosis not present

## 2014-07-13 DIAGNOSIS — M069 Rheumatoid arthritis, unspecified: Secondary | ICD-10-CM | POA: Diagnosis not present

## 2014-07-13 DIAGNOSIS — Z79899 Other long term (current) drug therapy: Secondary | ICD-10-CM | POA: Diagnosis not present

## 2014-07-13 DIAGNOSIS — R7309 Other abnormal glucose: Secondary | ICD-10-CM | POA: Diagnosis not present

## 2014-07-13 DIAGNOSIS — G2581 Restless legs syndrome: Secondary | ICD-10-CM | POA: Diagnosis not present

## 2014-07-27 DIAGNOSIS — R5381 Other malaise: Secondary | ICD-10-CM | POA: Diagnosis not present

## 2014-07-27 DIAGNOSIS — N3000 Acute cystitis without hematuria: Secondary | ICD-10-CM | POA: Diagnosis not present

## 2014-07-27 DIAGNOSIS — B029 Zoster without complications: Secondary | ICD-10-CM | POA: Diagnosis not present

## 2014-07-27 DIAGNOSIS — N390 Urinary tract infection, site not specified: Secondary | ICD-10-CM | POA: Diagnosis not present

## 2014-07-27 DIAGNOSIS — R5383 Other fatigue: Secondary | ICD-10-CM | POA: Diagnosis not present

## 2014-07-29 DIAGNOSIS — M069 Rheumatoid arthritis, unspecified: Secondary | ICD-10-CM | POA: Diagnosis not present

## 2014-07-29 DIAGNOSIS — M48061 Spinal stenosis, lumbar region without neurogenic claudication: Secondary | ICD-10-CM | POA: Diagnosis not present

## 2014-07-29 DIAGNOSIS — N390 Urinary tract infection, site not specified: Secondary | ICD-10-CM | POA: Diagnosis not present

## 2014-07-29 DIAGNOSIS — M2550 Pain in unspecified joint: Secondary | ICD-10-CM | POA: Diagnosis not present

## 2014-08-01 DIAGNOSIS — R509 Fever, unspecified: Secondary | ICD-10-CM | POA: Diagnosis not present

## 2014-08-01 DIAGNOSIS — R05 Cough: Secondary | ICD-10-CM | POA: Diagnosis not present

## 2014-08-01 DIAGNOSIS — R059 Cough, unspecified: Secondary | ICD-10-CM | POA: Diagnosis not present

## 2014-08-01 DIAGNOSIS — R918 Other nonspecific abnormal finding of lung field: Secondary | ICD-10-CM | POA: Diagnosis not present

## 2014-08-01 DIAGNOSIS — R5381 Other malaise: Secondary | ICD-10-CM | POA: Diagnosis not present

## 2014-08-01 DIAGNOSIS — J069 Acute upper respiratory infection, unspecified: Secondary | ICD-10-CM | POA: Diagnosis not present

## 2014-08-01 DIAGNOSIS — R5383 Other fatigue: Secondary | ICD-10-CM | POA: Diagnosis not present

## 2014-08-01 DIAGNOSIS — J984 Other disorders of lung: Secondary | ICD-10-CM | POA: Diagnosis not present

## 2014-08-01 DIAGNOSIS — R82998 Other abnormal findings in urine: Secondary | ICD-10-CM | POA: Diagnosis not present

## 2014-08-04 DIAGNOSIS — J189 Pneumonia, unspecified organism: Secondary | ICD-10-CM | POA: Diagnosis not present

## 2014-08-10 DIAGNOSIS — J189 Pneumonia, unspecified organism: Secondary | ICD-10-CM | POA: Diagnosis not present

## 2014-08-22 DIAGNOSIS — N3080 Other cystitis without hematuria: Secondary | ICD-10-CM | POA: Diagnosis not present

## 2014-09-05 DIAGNOSIS — Z23 Encounter for immunization: Secondary | ICD-10-CM | POA: Diagnosis not present

## 2014-09-05 DIAGNOSIS — J189 Pneumonia, unspecified organism: Secondary | ICD-10-CM | POA: Diagnosis not present

## 2014-09-05 DIAGNOSIS — R358 Other polyuria: Secondary | ICD-10-CM | POA: Diagnosis not present

## 2014-09-05 DIAGNOSIS — H1033 Unspecified acute conjunctivitis, bilateral: Secondary | ICD-10-CM | POA: Diagnosis not present

## 2014-10-28 DIAGNOSIS — M4806 Spinal stenosis, lumbar region: Secondary | ICD-10-CM | POA: Diagnosis not present

## 2014-10-28 DIAGNOSIS — M0579 Rheumatoid arthritis with rheumatoid factor of multiple sites without organ or systems involvement: Secondary | ICD-10-CM | POA: Diagnosis not present

## 2014-10-31 DIAGNOSIS — I10 Essential (primary) hypertension: Secondary | ICD-10-CM | POA: Diagnosis not present

## 2014-10-31 DIAGNOSIS — E782 Mixed hyperlipidemia: Secondary | ICD-10-CM | POA: Diagnosis not present

## 2014-10-31 DIAGNOSIS — M069 Rheumatoid arthritis, unspecified: Secondary | ICD-10-CM | POA: Diagnosis not present

## 2014-10-31 DIAGNOSIS — E780 Pure hypercholesterolemia: Secondary | ICD-10-CM | POA: Diagnosis not present

## 2014-10-31 DIAGNOSIS — K219 Gastro-esophageal reflux disease without esophagitis: Secondary | ICD-10-CM | POA: Diagnosis not present

## 2014-10-31 DIAGNOSIS — R1031 Right lower quadrant pain: Secondary | ICD-10-CM | POA: Diagnosis not present

## 2014-10-31 DIAGNOSIS — M545 Low back pain: Secondary | ICD-10-CM | POA: Diagnosis not present

## 2014-10-31 DIAGNOSIS — R7301 Impaired fasting glucose: Secondary | ICD-10-CM | POA: Diagnosis not present

## 2014-10-31 DIAGNOSIS — G2581 Restless legs syndrome: Secondary | ICD-10-CM | POA: Diagnosis not present

## 2014-11-14 DIAGNOSIS — N3289 Other specified disorders of bladder: Secondary | ICD-10-CM | POA: Diagnosis not present

## 2014-11-14 DIAGNOSIS — N3090 Cystitis, unspecified without hematuria: Secondary | ICD-10-CM | POA: Diagnosis not present

## 2014-11-14 DIAGNOSIS — N952 Postmenopausal atrophic vaginitis: Secondary | ICD-10-CM | POA: Diagnosis not present

## 2015-01-26 DIAGNOSIS — M5032 Other cervical disc degeneration, mid-cervical region: Secondary | ICD-10-CM | POA: Diagnosis not present

## 2015-01-26 DIAGNOSIS — M5410 Radiculopathy, site unspecified: Secondary | ICD-10-CM | POA: Diagnosis not present

## 2015-01-26 DIAGNOSIS — M47812 Spondylosis without myelopathy or radiculopathy, cervical region: Secondary | ICD-10-CM | POA: Diagnosis not present

## 2015-01-26 DIAGNOSIS — M4806 Spinal stenosis, lumbar region: Secondary | ICD-10-CM | POA: Diagnosis not present

## 2015-01-26 DIAGNOSIS — M791 Myalgia: Secondary | ICD-10-CM | POA: Diagnosis not present

## 2015-01-26 DIAGNOSIS — M0579 Rheumatoid arthritis with rheumatoid factor of multiple sites without organ or systems involvement: Secondary | ICD-10-CM | POA: Diagnosis not present

## 2015-02-01 DIAGNOSIS — Z1231 Encounter for screening mammogram for malignant neoplasm of breast: Secondary | ICD-10-CM | POA: Diagnosis not present

## 2015-02-01 DIAGNOSIS — E782 Mixed hyperlipidemia: Secondary | ICD-10-CM | POA: Diagnosis not present

## 2015-02-01 DIAGNOSIS — R7301 Impaired fasting glucose: Secondary | ICD-10-CM | POA: Diagnosis not present

## 2015-02-01 DIAGNOSIS — K219 Gastro-esophageal reflux disease without esophagitis: Secondary | ICD-10-CM | POA: Diagnosis not present

## 2015-02-01 DIAGNOSIS — I10 Essential (primary) hypertension: Secondary | ICD-10-CM | POA: Diagnosis not present

## 2015-02-01 DIAGNOSIS — F341 Dysthymic disorder: Secondary | ICD-10-CM | POA: Diagnosis not present

## 2015-02-01 DIAGNOSIS — M545 Low back pain: Secondary | ICD-10-CM | POA: Diagnosis not present

## 2015-03-15 DIAGNOSIS — N3080 Other cystitis without hematuria: Secondary | ICD-10-CM | POA: Diagnosis not present

## 2015-03-15 DIAGNOSIS — N3090 Cystitis, unspecified without hematuria: Secondary | ICD-10-CM | POA: Diagnosis not present

## 2015-03-15 DIAGNOSIS — N952 Postmenopausal atrophic vaginitis: Secondary | ICD-10-CM | POA: Diagnosis not present

## 2015-03-21 DIAGNOSIS — N3080 Other cystitis without hematuria: Secondary | ICD-10-CM | POA: Diagnosis not present

## 2015-04-13 DIAGNOSIS — H04123 Dry eye syndrome of bilateral lacrimal glands: Secondary | ICD-10-CM | POA: Diagnosis not present

## 2015-04-27 DIAGNOSIS — M419 Scoliosis, unspecified: Secondary | ICD-10-CM | POA: Diagnosis not present

## 2015-04-27 DIAGNOSIS — M069 Rheumatoid arthritis, unspecified: Secondary | ICD-10-CM | POA: Diagnosis present

## 2015-04-27 DIAGNOSIS — Z888 Allergy status to other drugs, medicaments and biological substances status: Secondary | ICD-10-CM | POA: Diagnosis not present

## 2015-04-27 DIAGNOSIS — J8410 Pulmonary fibrosis, unspecified: Secondary | ICD-10-CM | POA: Diagnosis not present

## 2015-04-27 DIAGNOSIS — J180 Bronchopneumonia, unspecified organism: Secondary | ICD-10-CM | POA: Diagnosis not present

## 2015-04-27 DIAGNOSIS — J4541 Moderate persistent asthma with (acute) exacerbation: Secondary | ICD-10-CM | POA: Diagnosis present

## 2015-04-27 DIAGNOSIS — R509 Fever, unspecified: Secondary | ICD-10-CM | POA: Diagnosis not present

## 2015-04-27 DIAGNOSIS — M545 Low back pain: Secondary | ICD-10-CM | POA: Diagnosis not present

## 2015-04-27 DIAGNOSIS — J209 Acute bronchitis, unspecified: Secondary | ICD-10-CM | POA: Diagnosis not present

## 2015-04-27 DIAGNOSIS — Z79890 Hormone replacement therapy (postmenopausal): Secondary | ICD-10-CM | POA: Diagnosis not present

## 2015-04-27 DIAGNOSIS — Z87440 Personal history of urinary (tract) infections: Secondary | ICD-10-CM | POA: Diagnosis not present

## 2015-04-27 DIAGNOSIS — G2581 Restless legs syndrome: Secondary | ICD-10-CM | POA: Diagnosis present

## 2015-04-27 DIAGNOSIS — J45901 Unspecified asthma with (acute) exacerbation: Secondary | ICD-10-CM | POA: Diagnosis not present

## 2015-04-27 DIAGNOSIS — J704 Drug-induced interstitial lung disorders, unspecified: Secondary | ICD-10-CM | POA: Diagnosis not present

## 2015-04-27 DIAGNOSIS — R0902 Hypoxemia: Secondary | ICD-10-CM | POA: Diagnosis not present

## 2015-04-27 DIAGNOSIS — J22 Unspecified acute lower respiratory infection: Secondary | ICD-10-CM | POA: Diagnosis not present

## 2015-04-27 DIAGNOSIS — Z791 Long term (current) use of non-steroidal anti-inflammatories (NSAID): Secondary | ICD-10-CM | POA: Diagnosis not present

## 2015-04-27 DIAGNOSIS — E780 Pure hypercholesterolemia: Secondary | ICD-10-CM | POA: Diagnosis present

## 2015-04-27 DIAGNOSIS — F329 Major depressive disorder, single episode, unspecified: Secondary | ICD-10-CM | POA: Diagnosis present

## 2015-04-27 DIAGNOSIS — K219 Gastro-esophageal reflux disease without esophagitis: Secondary | ICD-10-CM | POA: Diagnosis present

## 2015-04-27 DIAGNOSIS — G8929 Other chronic pain: Secondary | ICD-10-CM | POA: Diagnosis not present

## 2015-04-27 DIAGNOSIS — T451X5S Adverse effect of antineoplastic and immunosuppressive drugs, sequela: Secondary | ICD-10-CM | POA: Diagnosis not present

## 2015-04-27 DIAGNOSIS — J9621 Acute and chronic respiratory failure with hypoxia: Secondary | ICD-10-CM | POA: Diagnosis not present

## 2015-04-27 DIAGNOSIS — I119 Hypertensive heart disease without heart failure: Secondary | ICD-10-CM | POA: Diagnosis not present

## 2015-04-27 DIAGNOSIS — I351 Nonrheumatic aortic (valve) insufficiency: Secondary | ICD-10-CM | POA: Diagnosis not present

## 2015-04-27 DIAGNOSIS — R0602 Shortness of breath: Secondary | ICD-10-CM | POA: Diagnosis not present

## 2015-04-27 DIAGNOSIS — N3000 Acute cystitis without hematuria: Secondary | ICD-10-CM | POA: Diagnosis present

## 2015-04-27 DIAGNOSIS — R069 Unspecified abnormalities of breathing: Secondary | ICD-10-CM | POA: Diagnosis not present

## 2015-04-27 DIAGNOSIS — J189 Pneumonia, unspecified organism: Secondary | ICD-10-CM | POA: Diagnosis not present

## 2015-04-27 DIAGNOSIS — I361 Nonrheumatic tricuspid (valve) insufficiency: Secondary | ICD-10-CM | POA: Diagnosis not present

## 2015-04-27 DIAGNOSIS — Z90710 Acquired absence of both cervix and uterus: Secondary | ICD-10-CM | POA: Diagnosis not present

## 2015-05-11 DIAGNOSIS — E780 Pure hypercholesterolemia: Secondary | ICD-10-CM | POA: Diagnosis not present

## 2015-05-11 DIAGNOSIS — F341 Dysthymic disorder: Secondary | ICD-10-CM | POA: Diagnosis not present

## 2015-05-11 DIAGNOSIS — E782 Mixed hyperlipidemia: Secondary | ICD-10-CM | POA: Diagnosis not present

## 2015-05-11 DIAGNOSIS — M545 Low back pain: Secondary | ICD-10-CM | POA: Diagnosis not present

## 2015-05-11 DIAGNOSIS — R7301 Impaired fasting glucose: Secondary | ICD-10-CM | POA: Diagnosis not present

## 2015-05-11 DIAGNOSIS — K219 Gastro-esophageal reflux disease without esophagitis: Secondary | ICD-10-CM | POA: Diagnosis not present

## 2015-05-11 DIAGNOSIS — I10 Essential (primary) hypertension: Secondary | ICD-10-CM | POA: Diagnosis not present

## 2015-06-16 DIAGNOSIS — Z780 Asymptomatic menopausal state: Secondary | ICD-10-CM | POA: Diagnosis not present

## 2015-06-16 DIAGNOSIS — Z1231 Encounter for screening mammogram for malignant neoplasm of breast: Secondary | ICD-10-CM | POA: Diagnosis not present

## 2015-06-16 DIAGNOSIS — Z13820 Encounter for screening for osteoporosis: Secondary | ICD-10-CM | POA: Diagnosis not present

## 2015-08-01 DIAGNOSIS — N952 Postmenopausal atrophic vaginitis: Secondary | ICD-10-CM | POA: Diagnosis not present

## 2015-08-01 DIAGNOSIS — N3090 Cystitis, unspecified without hematuria: Secondary | ICD-10-CM | POA: Diagnosis not present

## 2015-08-23 DIAGNOSIS — F341 Dysthymic disorder: Secondary | ICD-10-CM | POA: Diagnosis not present

## 2015-08-23 DIAGNOSIS — E782 Mixed hyperlipidemia: Secondary | ICD-10-CM | POA: Diagnosis not present

## 2015-08-23 DIAGNOSIS — E7800 Pure hypercholesterolemia, unspecified: Secondary | ICD-10-CM | POA: Diagnosis not present

## 2015-08-23 DIAGNOSIS — K219 Gastro-esophageal reflux disease without esophagitis: Secondary | ICD-10-CM | POA: Diagnosis not present

## 2015-08-23 DIAGNOSIS — I10 Essential (primary) hypertension: Secondary | ICD-10-CM | POA: Diagnosis not present

## 2015-08-23 DIAGNOSIS — E6609 Other obesity due to excess calories: Secondary | ICD-10-CM | POA: Diagnosis not present

## 2015-08-23 DIAGNOSIS — Z6837 Body mass index (BMI) 37.0-37.9, adult: Secondary | ICD-10-CM | POA: Diagnosis not present

## 2015-08-23 DIAGNOSIS — R7301 Impaired fasting glucose: Secondary | ICD-10-CM | POA: Diagnosis not present

## 2015-08-23 DIAGNOSIS — M545 Low back pain: Secondary | ICD-10-CM | POA: Diagnosis not present

## 2015-08-23 DIAGNOSIS — Z23 Encounter for immunization: Secondary | ICD-10-CM | POA: Diagnosis not present

## 2015-09-19 DIAGNOSIS — M5030 Other cervical disc degeneration, unspecified cervical region: Secondary | ICD-10-CM | POA: Diagnosis not present

## 2015-09-19 DIAGNOSIS — M791 Myalgia: Secondary | ICD-10-CM | POA: Diagnosis not present

## 2015-09-19 DIAGNOSIS — M4806 Spinal stenosis, lumbar region: Secondary | ICD-10-CM | POA: Diagnosis not present

## 2015-09-19 DIAGNOSIS — Z79899 Other long term (current) drug therapy: Secondary | ICD-10-CM | POA: Diagnosis not present

## 2015-09-19 DIAGNOSIS — M79672 Pain in left foot: Secondary | ICD-10-CM | POA: Diagnosis not present

## 2015-09-19 DIAGNOSIS — M150 Primary generalized (osteo)arthritis: Secondary | ICD-10-CM | POA: Diagnosis not present

## 2015-09-19 DIAGNOSIS — M0579 Rheumatoid arthritis with rheumatoid factor of multiple sites without organ or systems involvement: Secondary | ICD-10-CM | POA: Diagnosis not present

## 2015-10-18 DIAGNOSIS — N3090 Cystitis, unspecified without hematuria: Secondary | ICD-10-CM | POA: Diagnosis not present

## 2015-12-01 DIAGNOSIS — N3090 Cystitis, unspecified without hematuria: Secondary | ICD-10-CM | POA: Diagnosis not present

## 2015-12-01 DIAGNOSIS — R339 Retention of urine, unspecified: Secondary | ICD-10-CM | POA: Diagnosis not present

## 2015-12-01 DIAGNOSIS — N952 Postmenopausal atrophic vaginitis: Secondary | ICD-10-CM | POA: Diagnosis not present

## 2016-01-04 DIAGNOSIS — Z79899 Other long term (current) drug therapy: Secondary | ICD-10-CM | POA: Diagnosis not present

## 2016-01-04 DIAGNOSIS — M150 Primary generalized (osteo)arthritis: Secondary | ICD-10-CM | POA: Diagnosis not present

## 2016-01-04 DIAGNOSIS — M0579 Rheumatoid arthritis with rheumatoid factor of multiple sites without organ or systems involvement: Secondary | ICD-10-CM | POA: Diagnosis not present

## 2016-01-04 DIAGNOSIS — M542 Cervicalgia: Secondary | ICD-10-CM | POA: Diagnosis not present

## 2016-01-04 DIAGNOSIS — M4806 Spinal stenosis, lumbar region: Secondary | ICD-10-CM | POA: Diagnosis not present

## 2016-01-09 DIAGNOSIS — N390 Urinary tract infection, site not specified: Secondary | ICD-10-CM | POA: Diagnosis not present

## 2016-01-19 DIAGNOSIS — M542 Cervicalgia: Secondary | ICD-10-CM | POA: Diagnosis not present

## 2016-01-19 DIAGNOSIS — M533 Sacrococcygeal disorders, not elsewhere classified: Secondary | ICD-10-CM | POA: Diagnosis not present

## 2016-01-19 DIAGNOSIS — M5442 Lumbago with sciatica, left side: Secondary | ICD-10-CM | POA: Diagnosis not present

## 2016-01-29 DIAGNOSIS — N3090 Cystitis, unspecified without hematuria: Secondary | ICD-10-CM | POA: Diagnosis not present

## 2016-01-29 DIAGNOSIS — N390 Urinary tract infection, site not specified: Secondary | ICD-10-CM | POA: Diagnosis not present

## 2016-03-06 DIAGNOSIS — M533 Sacrococcygeal disorders, not elsewhere classified: Secondary | ICD-10-CM | POA: Diagnosis not present

## 2016-04-01 DIAGNOSIS — N952 Postmenopausal atrophic vaginitis: Secondary | ICD-10-CM | POA: Diagnosis not present

## 2016-04-01 DIAGNOSIS — N3090 Cystitis, unspecified without hematuria: Secondary | ICD-10-CM | POA: Diagnosis not present

## 2016-04-02 DIAGNOSIS — Z79899 Other long term (current) drug therapy: Secondary | ICD-10-CM | POA: Diagnosis not present

## 2016-04-02 DIAGNOSIS — M150 Primary generalized (osteo)arthritis: Secondary | ICD-10-CM | POA: Diagnosis not present

## 2016-04-02 DIAGNOSIS — M4806 Spinal stenosis, lumbar region: Secondary | ICD-10-CM | POA: Diagnosis not present

## 2016-04-02 DIAGNOSIS — M542 Cervicalgia: Secondary | ICD-10-CM | POA: Diagnosis not present

## 2016-04-02 DIAGNOSIS — M0579 Rheumatoid arthritis with rheumatoid factor of multiple sites without organ or systems involvement: Secondary | ICD-10-CM | POA: Diagnosis not present

## 2016-04-05 DIAGNOSIS — M5432 Sciatica, left side: Secondary | ICD-10-CM | POA: Diagnosis not present

## 2016-04-05 DIAGNOSIS — N3000 Acute cystitis without hematuria: Secondary | ICD-10-CM | POA: Diagnosis not present

## 2016-04-17 DIAGNOSIS — N3000 Acute cystitis without hematuria: Secondary | ICD-10-CM | POA: Diagnosis not present

## 2016-05-27 DIAGNOSIS — F341 Dysthymic disorder: Secondary | ICD-10-CM | POA: Diagnosis not present

## 2016-05-27 DIAGNOSIS — I10 Essential (primary) hypertension: Secondary | ICD-10-CM | POA: Diagnosis not present

## 2016-05-27 DIAGNOSIS — H538 Other visual disturbances: Secondary | ICD-10-CM | POA: Diagnosis not present

## 2016-05-27 DIAGNOSIS — R7301 Impaired fasting glucose: Secondary | ICD-10-CM | POA: Diagnosis not present

## 2016-05-27 DIAGNOSIS — K219 Gastro-esophageal reflux disease without esophagitis: Secondary | ICD-10-CM | POA: Diagnosis not present

## 2016-05-27 DIAGNOSIS — E782 Mixed hyperlipidemia: Secondary | ICD-10-CM | POA: Diagnosis not present

## 2016-05-27 DIAGNOSIS — R601 Generalized edema: Secondary | ICD-10-CM | POA: Diagnosis not present

## 2016-05-27 DIAGNOSIS — M5432 Sciatica, left side: Secondary | ICD-10-CM | POA: Diagnosis not present

## 2016-05-27 DIAGNOSIS — M545 Low back pain: Secondary | ICD-10-CM | POA: Diagnosis not present

## 2016-06-17 DIAGNOSIS — M542 Cervicalgia: Secondary | ICD-10-CM | POA: Diagnosis not present

## 2016-06-17 DIAGNOSIS — M4806 Spinal stenosis, lumbar region: Secondary | ICD-10-CM | POA: Diagnosis not present

## 2016-06-17 DIAGNOSIS — Z79899 Other long term (current) drug therapy: Secondary | ICD-10-CM | POA: Diagnosis not present

## 2016-06-17 DIAGNOSIS — M0579 Rheumatoid arthritis with rheumatoid factor of multiple sites without organ or systems involvement: Secondary | ICD-10-CM | POA: Diagnosis not present

## 2016-06-17 DIAGNOSIS — M150 Primary generalized (osteo)arthritis: Secondary | ICD-10-CM | POA: Diagnosis not present

## 2016-07-01 DIAGNOSIS — Z23 Encounter for immunization: Secondary | ICD-10-CM | POA: Diagnosis not present

## 2016-07-01 DIAGNOSIS — M5013 Cervical disc disorder with radiculopathy, cervicothoracic region: Secondary | ICD-10-CM | POA: Diagnosis not present

## 2016-07-01 DIAGNOSIS — J4520 Mild intermittent asthma, uncomplicated: Secondary | ICD-10-CM | POA: Diagnosis not present

## 2016-07-01 DIAGNOSIS — M542 Cervicalgia: Secondary | ICD-10-CM | POA: Diagnosis not present

## 2016-07-01 DIAGNOSIS — M5432 Sciatica, left side: Secondary | ICD-10-CM | POA: Diagnosis not present

## 2016-07-01 DIAGNOSIS — M069 Rheumatoid arthritis, unspecified: Secondary | ICD-10-CM | POA: Diagnosis not present

## 2016-07-08 DIAGNOSIS — M2578 Osteophyte, vertebrae: Secondary | ICD-10-CM | POA: Diagnosis not present

## 2016-07-08 DIAGNOSIS — M5013 Cervical disc disorder with radiculopathy, cervicothoracic region: Secondary | ICD-10-CM | POA: Diagnosis not present

## 2016-07-08 DIAGNOSIS — M4802 Spinal stenosis, cervical region: Secondary | ICD-10-CM | POA: Diagnosis not present

## 2016-07-16 ENCOUNTER — Other Ambulatory Visit: Payer: Self-pay

## 2016-07-22 DIAGNOSIS — N390 Urinary tract infection, site not specified: Secondary | ICD-10-CM | POA: Diagnosis not present

## 2016-08-13 DIAGNOSIS — M1712 Unilateral primary osteoarthritis, left knee: Secondary | ICD-10-CM | POA: Diagnosis not present

## 2016-08-15 DIAGNOSIS — N390 Urinary tract infection, site not specified: Secondary | ICD-10-CM | POA: Diagnosis not present

## 2016-08-19 DIAGNOSIS — M5090 Cervical disc disorder, unspecified, unspecified cervical region: Secondary | ICD-10-CM | POA: Insufficient documentation

## 2016-08-19 DIAGNOSIS — M05712 Rheumatoid arthritis with rheumatoid factor of left shoulder without organ or systems involvement: Secondary | ICD-10-CM | POA: Diagnosis not present

## 2016-08-19 DIAGNOSIS — M05711 Rheumatoid arthritis with rheumatoid factor of right shoulder without organ or systems involvement: Secondary | ICD-10-CM | POA: Insufficient documentation

## 2016-08-19 DIAGNOSIS — M542 Cervicalgia: Secondary | ICD-10-CM | POA: Insufficient documentation

## 2016-08-19 DIAGNOSIS — M50322 Other cervical disc degeneration at C5-C6 level: Secondary | ICD-10-CM | POA: Diagnosis not present

## 2016-08-19 DIAGNOSIS — M9971 Connective tissue and disc stenosis of intervertebral foramina of cervical region: Secondary | ICD-10-CM | POA: Diagnosis not present

## 2016-08-19 DIAGNOSIS — M4722 Other spondylosis with radiculopathy, cervical region: Secondary | ICD-10-CM | POA: Diagnosis not present

## 2016-08-19 DIAGNOSIS — M0579 Rheumatoid arthritis with rheumatoid factor of multiple sites without organ or systems involvement: Secondary | ICD-10-CM | POA: Diagnosis not present

## 2016-08-19 DIAGNOSIS — M4312 Spondylolisthesis, cervical region: Secondary | ICD-10-CM | POA: Diagnosis not present

## 2016-08-21 DIAGNOSIS — M4004 Postural kyphosis, thoracic region: Secondary | ICD-10-CM | POA: Diagnosis not present

## 2016-08-21 DIAGNOSIS — M6281 Muscle weakness (generalized): Secondary | ICD-10-CM | POA: Diagnosis not present

## 2016-08-21 DIAGNOSIS — M542 Cervicalgia: Secondary | ICD-10-CM | POA: Diagnosis not present

## 2016-08-23 DIAGNOSIS — M542 Cervicalgia: Secondary | ICD-10-CM | POA: Diagnosis not present

## 2016-08-23 DIAGNOSIS — M6281 Muscle weakness (generalized): Secondary | ICD-10-CM | POA: Diagnosis not present

## 2016-08-23 DIAGNOSIS — M4004 Postural kyphosis, thoracic region: Secondary | ICD-10-CM | POA: Diagnosis not present

## 2016-08-26 DIAGNOSIS — M542 Cervicalgia: Secondary | ICD-10-CM | POA: Diagnosis not present

## 2016-08-26 DIAGNOSIS — M6281 Muscle weakness (generalized): Secondary | ICD-10-CM | POA: Diagnosis not present

## 2016-08-26 DIAGNOSIS — M4004 Postural kyphosis, thoracic region: Secondary | ICD-10-CM | POA: Diagnosis not present

## 2016-08-29 DIAGNOSIS — M4004 Postural kyphosis, thoracic region: Secondary | ICD-10-CM | POA: Diagnosis not present

## 2016-08-29 DIAGNOSIS — M6281 Muscle weakness (generalized): Secondary | ICD-10-CM | POA: Diagnosis not present

## 2016-08-29 DIAGNOSIS — M542 Cervicalgia: Secondary | ICD-10-CM | POA: Diagnosis not present

## 2016-09-03 DIAGNOSIS — M4004 Postural kyphosis, thoracic region: Secondary | ICD-10-CM | POA: Diagnosis not present

## 2016-09-03 DIAGNOSIS — M6281 Muscle weakness (generalized): Secondary | ICD-10-CM | POA: Diagnosis not present

## 2016-09-03 DIAGNOSIS — M542 Cervicalgia: Secondary | ICD-10-CM | POA: Diagnosis not present

## 2016-09-06 DIAGNOSIS — M6281 Muscle weakness (generalized): Secondary | ICD-10-CM | POA: Diagnosis not present

## 2016-09-06 DIAGNOSIS — M542 Cervicalgia: Secondary | ICD-10-CM | POA: Diagnosis not present

## 2016-09-06 DIAGNOSIS — M4004 Postural kyphosis, thoracic region: Secondary | ICD-10-CM | POA: Diagnosis not present

## 2016-09-10 DIAGNOSIS — Z0000 Encounter for general adult medical examination without abnormal findings: Secondary | ICD-10-CM | POA: Diagnosis not present

## 2016-09-10 DIAGNOSIS — Z6837 Body mass index (BMI) 37.0-37.9, adult: Secondary | ICD-10-CM | POA: Diagnosis not present

## 2016-09-11 DIAGNOSIS — M6281 Muscle weakness (generalized): Secondary | ICD-10-CM | POA: Diagnosis not present

## 2016-09-11 DIAGNOSIS — M4004 Postural kyphosis, thoracic region: Secondary | ICD-10-CM | POA: Diagnosis not present

## 2016-09-11 DIAGNOSIS — M542 Cervicalgia: Secondary | ICD-10-CM | POA: Diagnosis not present

## 2016-09-13 DIAGNOSIS — M6281 Muscle weakness (generalized): Secondary | ICD-10-CM | POA: Diagnosis not present

## 2016-09-13 DIAGNOSIS — M4004 Postural kyphosis, thoracic region: Secondary | ICD-10-CM | POA: Diagnosis not present

## 2016-09-13 DIAGNOSIS — M542 Cervicalgia: Secondary | ICD-10-CM | POA: Diagnosis not present

## 2016-09-16 DIAGNOSIS — M4004 Postural kyphosis, thoracic region: Secondary | ICD-10-CM | POA: Diagnosis not present

## 2016-09-16 DIAGNOSIS — M6281 Muscle weakness (generalized): Secondary | ICD-10-CM | POA: Diagnosis not present

## 2016-09-16 DIAGNOSIS — M542 Cervicalgia: Secondary | ICD-10-CM | POA: Diagnosis not present

## 2016-09-18 DIAGNOSIS — E782 Mixed hyperlipidemia: Secondary | ICD-10-CM | POA: Diagnosis not present

## 2016-09-18 DIAGNOSIS — R030 Elevated blood-pressure reading, without diagnosis of hypertension: Secondary | ICD-10-CM | POA: Diagnosis not present

## 2016-09-18 DIAGNOSIS — I6522 Occlusion and stenosis of left carotid artery: Secondary | ICD-10-CM | POA: Diagnosis not present

## 2016-09-18 DIAGNOSIS — I6523 Occlusion and stenosis of bilateral carotid arteries: Secondary | ICD-10-CM | POA: Diagnosis not present

## 2016-09-25 DIAGNOSIS — M6281 Muscle weakness (generalized): Secondary | ICD-10-CM | POA: Diagnosis not present

## 2016-09-25 DIAGNOSIS — M4004 Postural kyphosis, thoracic region: Secondary | ICD-10-CM | POA: Diagnosis not present

## 2016-09-25 DIAGNOSIS — M542 Cervicalgia: Secondary | ICD-10-CM | POA: Diagnosis not present

## 2016-09-30 DIAGNOSIS — M542 Cervicalgia: Secondary | ICD-10-CM | POA: Diagnosis not present

## 2016-09-30 DIAGNOSIS — M4722 Other spondylosis with radiculopathy, cervical region: Secondary | ICD-10-CM | POA: Diagnosis not present

## 2016-09-30 DIAGNOSIS — M05711 Rheumatoid arthritis with rheumatoid factor of right shoulder without organ or systems involvement: Secondary | ICD-10-CM | POA: Diagnosis not present

## 2016-09-30 DIAGNOSIS — M05712 Rheumatoid arthritis with rheumatoid factor of left shoulder without organ or systems involvement: Secondary | ICD-10-CM | POA: Diagnosis not present

## 2016-10-01 DIAGNOSIS — N390 Urinary tract infection, site not specified: Secondary | ICD-10-CM | POA: Diagnosis not present

## 2016-10-04 DIAGNOSIS — M542 Cervicalgia: Secondary | ICD-10-CM | POA: Diagnosis not present

## 2016-10-04 DIAGNOSIS — M6281 Muscle weakness (generalized): Secondary | ICD-10-CM | POA: Diagnosis not present

## 2016-10-04 DIAGNOSIS — M4004 Postural kyphosis, thoracic region: Secondary | ICD-10-CM | POA: Diagnosis not present

## 2016-10-07 DIAGNOSIS — M25511 Pain in right shoulder: Secondary | ICD-10-CM | POA: Diagnosis not present

## 2016-10-07 DIAGNOSIS — M25512 Pain in left shoulder: Secondary | ICD-10-CM | POA: Diagnosis not present

## 2016-10-07 DIAGNOSIS — E669 Obesity, unspecified: Secondary | ICD-10-CM | POA: Diagnosis not present

## 2016-10-07 DIAGNOSIS — M542 Cervicalgia: Secondary | ICD-10-CM | POA: Diagnosis not present

## 2016-10-07 DIAGNOSIS — M0579 Rheumatoid arthritis with rheumatoid factor of multiple sites without organ or systems involvement: Secondary | ICD-10-CM | POA: Diagnosis not present

## 2016-10-07 DIAGNOSIS — Z79899 Other long term (current) drug therapy: Secondary | ICD-10-CM | POA: Diagnosis not present

## 2016-10-07 DIAGNOSIS — M48061 Spinal stenosis, lumbar region without neurogenic claudication: Secondary | ICD-10-CM | POA: Diagnosis not present

## 2016-10-07 DIAGNOSIS — M150 Primary generalized (osteo)arthritis: Secondary | ICD-10-CM | POA: Diagnosis not present

## 2016-10-07 DIAGNOSIS — Z6836 Body mass index (BMI) 36.0-36.9, adult: Secondary | ICD-10-CM | POA: Diagnosis not present

## 2016-10-08 DIAGNOSIS — H26493 Other secondary cataract, bilateral: Secondary | ICD-10-CM | POA: Diagnosis not present

## 2016-10-08 DIAGNOSIS — H35362 Drusen (degenerative) of macula, left eye: Secondary | ICD-10-CM | POA: Diagnosis not present

## 2016-10-08 DIAGNOSIS — H35371 Puckering of macula, right eye: Secondary | ICD-10-CM | POA: Diagnosis not present

## 2016-10-09 DIAGNOSIS — M6281 Muscle weakness (generalized): Secondary | ICD-10-CM | POA: Diagnosis not present

## 2016-10-09 DIAGNOSIS — M542 Cervicalgia: Secondary | ICD-10-CM | POA: Diagnosis not present

## 2016-10-09 DIAGNOSIS — M4004 Postural kyphosis, thoracic region: Secondary | ICD-10-CM | POA: Diagnosis not present

## 2016-10-16 DIAGNOSIS — M5412 Radiculopathy, cervical region: Secondary | ICD-10-CM | POA: Diagnosis not present

## 2016-10-16 DIAGNOSIS — M4004 Postural kyphosis, thoracic region: Secondary | ICD-10-CM | POA: Diagnosis not present

## 2016-10-16 DIAGNOSIS — M542 Cervicalgia: Secondary | ICD-10-CM | POA: Diagnosis not present

## 2016-10-16 DIAGNOSIS — M6281 Muscle weakness (generalized): Secondary | ICD-10-CM | POA: Diagnosis not present

## 2016-10-22 DIAGNOSIS — Z1231 Encounter for screening mammogram for malignant neoplasm of breast: Secondary | ICD-10-CM | POA: Diagnosis not present

## 2016-10-30 DIAGNOSIS — J208 Acute bronchitis due to other specified organisms: Secondary | ICD-10-CM | POA: Diagnosis not present

## 2016-10-30 DIAGNOSIS — J4541 Moderate persistent asthma with (acute) exacerbation: Secondary | ICD-10-CM | POA: Diagnosis not present

## 2016-12-16 DIAGNOSIS — R601 Generalized edema: Secondary | ICD-10-CM | POA: Diagnosis not present

## 2016-12-16 DIAGNOSIS — F341 Dysthymic disorder: Secondary | ICD-10-CM | POA: Diagnosis not present

## 2016-12-16 DIAGNOSIS — M545 Low back pain: Secondary | ICD-10-CM | POA: Diagnosis not present

## 2016-12-16 DIAGNOSIS — I10 Essential (primary) hypertension: Secondary | ICD-10-CM | POA: Diagnosis not present

## 2016-12-16 DIAGNOSIS — E782 Mixed hyperlipidemia: Secondary | ICD-10-CM | POA: Diagnosis not present

## 2016-12-16 DIAGNOSIS — M150 Primary generalized (osteo)arthritis: Secondary | ICD-10-CM | POA: Diagnosis not present

## 2016-12-16 DIAGNOSIS — R7301 Impaired fasting glucose: Secondary | ICD-10-CM | POA: Diagnosis not present

## 2016-12-16 DIAGNOSIS — E559 Vitamin D deficiency, unspecified: Secondary | ICD-10-CM | POA: Diagnosis not present

## 2016-12-16 DIAGNOSIS — K219 Gastro-esophageal reflux disease without esophagitis: Secondary | ICD-10-CM | POA: Diagnosis not present

## 2017-01-07 DIAGNOSIS — E669 Obesity, unspecified: Secondary | ICD-10-CM | POA: Diagnosis not present

## 2017-01-07 DIAGNOSIS — Z6838 Body mass index (BMI) 38.0-38.9, adult: Secondary | ICD-10-CM | POA: Diagnosis not present

## 2017-01-07 DIAGNOSIS — Z79899 Other long term (current) drug therapy: Secondary | ICD-10-CM | POA: Diagnosis not present

## 2017-01-07 DIAGNOSIS — M150 Primary generalized (osteo)arthritis: Secondary | ICD-10-CM | POA: Diagnosis not present

## 2017-01-07 DIAGNOSIS — M0579 Rheumatoid arthritis with rheumatoid factor of multiple sites without organ or systems involvement: Secondary | ICD-10-CM | POA: Diagnosis not present

## 2017-01-07 DIAGNOSIS — M542 Cervicalgia: Secondary | ICD-10-CM | POA: Diagnosis not present

## 2017-01-27 DIAGNOSIS — N390 Urinary tract infection, site not specified: Secondary | ICD-10-CM | POA: Diagnosis not present

## 2017-03-19 DIAGNOSIS — G2581 Restless legs syndrome: Secondary | ICD-10-CM | POA: Diagnosis not present

## 2017-03-19 DIAGNOSIS — E559 Vitamin D deficiency, unspecified: Secondary | ICD-10-CM | POA: Diagnosis not present

## 2017-03-19 DIAGNOSIS — I10 Essential (primary) hypertension: Secondary | ICD-10-CM | POA: Diagnosis not present

## 2017-03-19 DIAGNOSIS — E782 Mixed hyperlipidemia: Secondary | ICD-10-CM | POA: Diagnosis not present

## 2017-03-19 DIAGNOSIS — R600 Localized edema: Secondary | ICD-10-CM | POA: Diagnosis not present

## 2017-03-19 DIAGNOSIS — R7301 Impaired fasting glucose: Secondary | ICD-10-CM | POA: Diagnosis not present

## 2017-03-19 DIAGNOSIS — M545 Low back pain: Secondary | ICD-10-CM | POA: Diagnosis not present

## 2017-03-19 DIAGNOSIS — M0579 Rheumatoid arthritis with rheumatoid factor of multiple sites without organ or systems involvement: Secondary | ICD-10-CM | POA: Diagnosis not present

## 2017-03-27 DIAGNOSIS — R600 Localized edema: Secondary | ICD-10-CM | POA: Diagnosis not present

## 2017-03-27 DIAGNOSIS — I10 Essential (primary) hypertension: Secondary | ICD-10-CM | POA: Diagnosis not present

## 2017-03-27 DIAGNOSIS — I351 Nonrheumatic aortic (valve) insufficiency: Secondary | ICD-10-CM | POA: Diagnosis not present

## 2017-03-27 DIAGNOSIS — I361 Nonrheumatic tricuspid (valve) insufficiency: Secondary | ICD-10-CM | POA: Diagnosis not present

## 2017-03-27 DIAGNOSIS — I082 Rheumatic disorders of both aortic and tricuspid valves: Secondary | ICD-10-CM | POA: Diagnosis not present

## 2017-04-01 DIAGNOSIS — H26493 Other secondary cataract, bilateral: Secondary | ICD-10-CM | POA: Diagnosis not present

## 2017-04-01 DIAGNOSIS — H04123 Dry eye syndrome of bilateral lacrimal glands: Secondary | ICD-10-CM | POA: Diagnosis not present

## 2017-04-01 DIAGNOSIS — H35371 Puckering of macula, right eye: Secondary | ICD-10-CM | POA: Diagnosis not present

## 2017-04-03 DIAGNOSIS — Z1211 Encounter for screening for malignant neoplasm of colon: Secondary | ICD-10-CM | POA: Diagnosis not present

## 2017-04-03 DIAGNOSIS — Z1212 Encounter for screening for malignant neoplasm of rectum: Secondary | ICD-10-CM | POA: Diagnosis not present

## 2017-04-08 DIAGNOSIS — M150 Primary generalized (osteo)arthritis: Secondary | ICD-10-CM | POA: Diagnosis not present

## 2017-04-08 DIAGNOSIS — M542 Cervicalgia: Secondary | ICD-10-CM | POA: Diagnosis not present

## 2017-04-08 DIAGNOSIS — I509 Heart failure, unspecified: Secondary | ICD-10-CM | POA: Diagnosis not present

## 2017-04-08 DIAGNOSIS — Z79899 Other long term (current) drug therapy: Secondary | ICD-10-CM | POA: Diagnosis not present

## 2017-04-08 DIAGNOSIS — E669 Obesity, unspecified: Secondary | ICD-10-CM | POA: Diagnosis not present

## 2017-04-08 DIAGNOSIS — Z6838 Body mass index (BMI) 38.0-38.9, adult: Secondary | ICD-10-CM | POA: Diagnosis not present

## 2017-04-08 DIAGNOSIS — M0579 Rheumatoid arthritis with rheumatoid factor of multiple sites without organ or systems involvement: Secondary | ICD-10-CM | POA: Diagnosis not present

## 2017-04-23 DIAGNOSIS — M1711 Unilateral primary osteoarthritis, right knee: Secondary | ICD-10-CM | POA: Diagnosis not present

## 2017-05-30 DIAGNOSIS — N390 Urinary tract infection, site not specified: Secondary | ICD-10-CM | POA: Diagnosis not present

## 2017-06-03 DIAGNOSIS — N390 Urinary tract infection, site not specified: Secondary | ICD-10-CM | POA: Diagnosis not present

## 2017-06-10 DIAGNOSIS — M1711 Unilateral primary osteoarthritis, right knee: Secondary | ICD-10-CM | POA: Diagnosis not present

## 2017-06-23 DIAGNOSIS — R7301 Impaired fasting glucose: Secondary | ICD-10-CM | POA: Diagnosis not present

## 2017-06-23 DIAGNOSIS — E559 Vitamin D deficiency, unspecified: Secondary | ICD-10-CM | POA: Diagnosis not present

## 2017-06-23 DIAGNOSIS — N952 Postmenopausal atrophic vaginitis: Secondary | ICD-10-CM | POA: Diagnosis not present

## 2017-06-23 DIAGNOSIS — M545 Low back pain: Secondary | ICD-10-CM | POA: Diagnosis not present

## 2017-06-23 DIAGNOSIS — R339 Retention of urine, unspecified: Secondary | ICD-10-CM | POA: Diagnosis not present

## 2017-06-23 DIAGNOSIS — M0579 Rheumatoid arthritis with rheumatoid factor of multiple sites without organ or systems involvement: Secondary | ICD-10-CM | POA: Diagnosis not present

## 2017-06-23 DIAGNOSIS — I10 Essential (primary) hypertension: Secondary | ICD-10-CM | POA: Diagnosis not present

## 2017-06-23 DIAGNOSIS — E782 Mixed hyperlipidemia: Secondary | ICD-10-CM | POA: Diagnosis not present

## 2017-06-23 DIAGNOSIS — G2581 Restless legs syndrome: Secondary | ICD-10-CM | POA: Diagnosis not present

## 2017-06-23 DIAGNOSIS — N3090 Cystitis, unspecified without hematuria: Secondary | ICD-10-CM | POA: Diagnosis not present

## 2017-07-01 DIAGNOSIS — M1712 Unilateral primary osteoarthritis, left knee: Secondary | ICD-10-CM | POA: Diagnosis not present

## 2017-07-02 DIAGNOSIS — M1711 Unilateral primary osteoarthritis, right knee: Secondary | ICD-10-CM | POA: Diagnosis not present

## 2017-07-09 DIAGNOSIS — M1711 Unilateral primary osteoarthritis, right knee: Secondary | ICD-10-CM | POA: Diagnosis not present

## 2017-07-21 DIAGNOSIS — M1711 Unilateral primary osteoarthritis, right knee: Secondary | ICD-10-CM | POA: Diagnosis not present

## 2017-07-29 DIAGNOSIS — N952 Postmenopausal atrophic vaginitis: Secondary | ICD-10-CM | POA: Diagnosis not present

## 2017-07-29 DIAGNOSIS — R339 Retention of urine, unspecified: Secondary | ICD-10-CM | POA: Diagnosis not present

## 2017-07-29 DIAGNOSIS — N390 Urinary tract infection, site not specified: Secondary | ICD-10-CM | POA: Diagnosis not present

## 2017-08-28 DIAGNOSIS — Z23 Encounter for immunization: Secondary | ICD-10-CM | POA: Diagnosis not present

## 2017-09-17 DIAGNOSIS — M545 Low back pain: Secondary | ICD-10-CM | POA: Diagnosis not present

## 2017-09-29 DIAGNOSIS — M0579 Rheumatoid arthritis with rheumatoid factor of multiple sites without organ or systems involvement: Secondary | ICD-10-CM | POA: Diagnosis not present

## 2017-09-29 DIAGNOSIS — E559 Vitamin D deficiency, unspecified: Secondary | ICD-10-CM | POA: Diagnosis not present

## 2017-09-29 DIAGNOSIS — R7301 Impaired fasting glucose: Secondary | ICD-10-CM | POA: Diagnosis not present

## 2017-09-29 DIAGNOSIS — E782 Mixed hyperlipidemia: Secondary | ICD-10-CM | POA: Diagnosis not present

## 2017-09-29 DIAGNOSIS — M545 Low back pain: Secondary | ICD-10-CM | POA: Diagnosis not present

## 2017-09-29 DIAGNOSIS — I10 Essential (primary) hypertension: Secondary | ICD-10-CM | POA: Diagnosis not present

## 2017-09-29 DIAGNOSIS — G2581 Restless legs syndrome: Secondary | ICD-10-CM | POA: Diagnosis not present

## 2017-09-29 DIAGNOSIS — N3000 Acute cystitis without hematuria: Secondary | ICD-10-CM | POA: Diagnosis not present

## 2017-10-06 DIAGNOSIS — H35373 Puckering of macula, bilateral: Secondary | ICD-10-CM | POA: Diagnosis not present

## 2017-10-06 DIAGNOSIS — H26493 Other secondary cataract, bilateral: Secondary | ICD-10-CM | POA: Diagnosis not present

## 2017-10-13 ENCOUNTER — Institutional Professional Consult (permissible substitution): Payer: Medicare Other | Admitting: Pulmonary Disease

## 2017-10-14 DIAGNOSIS — J4530 Mild persistent asthma, uncomplicated: Secondary | ICD-10-CM | POA: Diagnosis not present

## 2017-10-14 DIAGNOSIS — R600 Localized edema: Secondary | ICD-10-CM | POA: Diagnosis not present

## 2017-10-14 DIAGNOSIS — I10 Essential (primary) hypertension: Secondary | ICD-10-CM | POA: Diagnosis not present

## 2017-10-14 DIAGNOSIS — Z0001 Encounter for general adult medical examination with abnormal findings: Secondary | ICD-10-CM | POA: Diagnosis not present

## 2017-10-14 DIAGNOSIS — N958 Other specified menopausal and perimenopausal disorders: Secondary | ICD-10-CM | POA: Diagnosis not present

## 2017-10-14 DIAGNOSIS — Z1231 Encounter for screening mammogram for malignant neoplasm of breast: Secondary | ICD-10-CM | POA: Diagnosis not present

## 2017-10-17 DIAGNOSIS — M545 Low back pain: Secondary | ICD-10-CM | POA: Diagnosis not present

## 2017-10-17 DIAGNOSIS — M4316 Spondylolisthesis, lumbar region: Secondary | ICD-10-CM | POA: Diagnosis not present

## 2017-10-24 DIAGNOSIS — M4316 Spondylolisthesis, lumbar region: Secondary | ICD-10-CM | POA: Diagnosis not present

## 2017-10-24 DIAGNOSIS — M48061 Spinal stenosis, lumbar region without neurogenic claudication: Secondary | ICD-10-CM | POA: Diagnosis not present

## 2017-10-28 DIAGNOSIS — N816 Rectocele: Secondary | ICD-10-CM | POA: Diagnosis not present

## 2017-10-28 DIAGNOSIS — R339 Retention of urine, unspecified: Secondary | ICD-10-CM | POA: Diagnosis not present

## 2017-10-28 DIAGNOSIS — N3090 Cystitis, unspecified without hematuria: Secondary | ICD-10-CM | POA: Diagnosis not present

## 2017-10-28 DIAGNOSIS — N907 Vulvar cyst: Secondary | ICD-10-CM | POA: Diagnosis not present

## 2017-10-28 DIAGNOSIS — K5909 Other constipation: Secondary | ICD-10-CM | POA: Diagnosis not present

## 2017-11-13 DIAGNOSIS — M48061 Spinal stenosis, lumbar region without neurogenic claudication: Secondary | ICD-10-CM | POA: Diagnosis not present

## 2017-11-13 DIAGNOSIS — M4316 Spondylolisthesis, lumbar region: Secondary | ICD-10-CM | POA: Diagnosis not present

## 2017-11-19 DIAGNOSIS — M8580 Other specified disorders of bone density and structure, unspecified site: Secondary | ICD-10-CM | POA: Diagnosis not present

## 2017-11-19 DIAGNOSIS — Z1231 Encounter for screening mammogram for malignant neoplasm of breast: Secondary | ICD-10-CM | POA: Diagnosis not present

## 2017-11-19 DIAGNOSIS — M8589 Other specified disorders of bone density and structure, multiple sites: Secondary | ICD-10-CM | POA: Diagnosis not present

## 2017-11-19 DIAGNOSIS — N958 Other specified menopausal and perimenopausal disorders: Secondary | ICD-10-CM | POA: Diagnosis not present

## 2017-11-19 LAB — HM DEXA SCAN

## 2017-11-27 DIAGNOSIS — J020 Streptococcal pharyngitis: Secondary | ICD-10-CM | POA: Diagnosis not present

## 2017-12-18 DIAGNOSIS — J208 Acute bronchitis due to other specified organisms: Secondary | ICD-10-CM | POA: Diagnosis not present

## 2018-01-01 DIAGNOSIS — R7301 Impaired fasting glucose: Secondary | ICD-10-CM | POA: Diagnosis not present

## 2018-01-01 DIAGNOSIS — M545 Low back pain: Secondary | ICD-10-CM | POA: Diagnosis not present

## 2018-01-01 DIAGNOSIS — E782 Mixed hyperlipidemia: Secondary | ICD-10-CM | POA: Diagnosis not present

## 2018-01-01 DIAGNOSIS — E559 Vitamin D deficiency, unspecified: Secondary | ICD-10-CM | POA: Diagnosis not present

## 2018-01-01 DIAGNOSIS — I10 Essential (primary) hypertension: Secondary | ICD-10-CM | POA: Diagnosis not present

## 2018-01-01 DIAGNOSIS — M0579 Rheumatoid arthritis with rheumatoid factor of multiple sites without organ or systems involvement: Secondary | ICD-10-CM | POA: Diagnosis not present

## 2018-01-01 DIAGNOSIS — G2581 Restless legs syndrome: Secondary | ICD-10-CM | POA: Diagnosis not present

## 2018-01-15 DIAGNOSIS — D696 Thrombocytopenia, unspecified: Secondary | ICD-10-CM | POA: Diagnosis not present

## 2018-01-20 DIAGNOSIS — M1712 Unilateral primary osteoarthritis, left knee: Secondary | ICD-10-CM | POA: Diagnosis not present

## 2018-01-20 DIAGNOSIS — M1711 Unilateral primary osteoarthritis, right knee: Secondary | ICD-10-CM | POA: Diagnosis not present

## 2018-02-12 DIAGNOSIS — M0579 Rheumatoid arthritis with rheumatoid factor of multiple sites without organ or systems involvement: Secondary | ICD-10-CM | POA: Diagnosis not present

## 2018-02-12 DIAGNOSIS — I10 Essential (primary) hypertension: Secondary | ICD-10-CM | POA: Diagnosis not present

## 2018-02-12 DIAGNOSIS — M06841 Other specified rheumatoid arthritis, right hand: Secondary | ICD-10-CM | POA: Diagnosis not present

## 2018-02-12 DIAGNOSIS — M542 Cervicalgia: Secondary | ICD-10-CM | POA: Diagnosis not present

## 2018-02-18 DIAGNOSIS — M0579 Rheumatoid arthritis with rheumatoid factor of multiple sites without organ or systems involvement: Secondary | ICD-10-CM | POA: Diagnosis not present

## 2018-02-18 DIAGNOSIS — M542 Cervicalgia: Secondary | ICD-10-CM | POA: Diagnosis not present

## 2018-02-18 DIAGNOSIS — Z6834 Body mass index (BMI) 34.0-34.9, adult: Secondary | ICD-10-CM | POA: Diagnosis not present

## 2018-02-18 DIAGNOSIS — E669 Obesity, unspecified: Secondary | ICD-10-CM | POA: Diagnosis not present

## 2018-02-18 DIAGNOSIS — I509 Heart failure, unspecified: Secondary | ICD-10-CM | POA: Diagnosis not present

## 2018-02-18 DIAGNOSIS — Z79899 Other long term (current) drug therapy: Secondary | ICD-10-CM | POA: Diagnosis not present

## 2018-02-18 DIAGNOSIS — M150 Primary generalized (osteo)arthritis: Secondary | ICD-10-CM | POA: Diagnosis not present

## 2018-03-03 DIAGNOSIS — E669 Obesity, unspecified: Secondary | ICD-10-CM | POA: Diagnosis not present

## 2018-03-03 DIAGNOSIS — Z6834 Body mass index (BMI) 34.0-34.9, adult: Secondary | ICD-10-CM | POA: Diagnosis not present

## 2018-03-03 DIAGNOSIS — M0579 Rheumatoid arthritis with rheumatoid factor of multiple sites without organ or systems involvement: Secondary | ICD-10-CM | POA: Diagnosis not present

## 2018-03-03 DIAGNOSIS — Z79899 Other long term (current) drug therapy: Secondary | ICD-10-CM | POA: Diagnosis not present

## 2018-03-03 DIAGNOSIS — I509 Heart failure, unspecified: Secondary | ICD-10-CM | POA: Diagnosis not present

## 2018-03-03 DIAGNOSIS — M150 Primary generalized (osteo)arthritis: Secondary | ICD-10-CM | POA: Diagnosis not present

## 2018-03-03 DIAGNOSIS — M48061 Spinal stenosis, lumbar region without neurogenic claudication: Secondary | ICD-10-CM | POA: Diagnosis not present

## 2018-03-03 DIAGNOSIS — R5383 Other fatigue: Secondary | ICD-10-CM | POA: Diagnosis not present

## 2018-04-01 DIAGNOSIS — Z6835 Body mass index (BMI) 35.0-35.9, adult: Secondary | ICD-10-CM | POA: Diagnosis not present

## 2018-04-01 DIAGNOSIS — M545 Low back pain: Secondary | ICD-10-CM | POA: Diagnosis not present

## 2018-04-01 DIAGNOSIS — R7301 Impaired fasting glucose: Secondary | ICD-10-CM | POA: Diagnosis not present

## 2018-04-01 DIAGNOSIS — E782 Mixed hyperlipidemia: Secondary | ICD-10-CM | POA: Diagnosis not present

## 2018-04-01 DIAGNOSIS — I10 Essential (primary) hypertension: Secondary | ICD-10-CM | POA: Diagnosis not present

## 2018-04-01 DIAGNOSIS — M0579 Rheumatoid arthritis with rheumatoid factor of multiple sites without organ or systems involvement: Secondary | ICD-10-CM | POA: Diagnosis not present

## 2018-04-01 DIAGNOSIS — E559 Vitamin D deficiency, unspecified: Secondary | ICD-10-CM | POA: Diagnosis not present

## 2018-04-01 DIAGNOSIS — G2581 Restless legs syndrome: Secondary | ICD-10-CM | POA: Diagnosis not present

## 2018-04-09 DIAGNOSIS — M48061 Spinal stenosis, lumbar region without neurogenic claudication: Secondary | ICD-10-CM | POA: Diagnosis not present

## 2018-04-09 DIAGNOSIS — M0579 Rheumatoid arthritis with rheumatoid factor of multiple sites without organ or systems involvement: Secondary | ICD-10-CM | POA: Diagnosis not present

## 2018-04-09 DIAGNOSIS — M150 Primary generalized (osteo)arthritis: Secondary | ICD-10-CM | POA: Diagnosis not present

## 2018-04-09 DIAGNOSIS — E669 Obesity, unspecified: Secondary | ICD-10-CM | POA: Diagnosis not present

## 2018-04-09 DIAGNOSIS — Z79899 Other long term (current) drug therapy: Secondary | ICD-10-CM | POA: Diagnosis not present

## 2018-04-09 DIAGNOSIS — M542 Cervicalgia: Secondary | ICD-10-CM | POA: Diagnosis not present

## 2018-04-09 DIAGNOSIS — Z6834 Body mass index (BMI) 34.0-34.9, adult: Secondary | ICD-10-CM | POA: Diagnosis not present

## 2018-04-15 DIAGNOSIS — M1712 Unilateral primary osteoarthritis, left knee: Secondary | ICD-10-CM | POA: Diagnosis not present

## 2018-04-15 DIAGNOSIS — M1711 Unilateral primary osteoarthritis, right knee: Secondary | ICD-10-CM | POA: Diagnosis not present

## 2018-06-01 DIAGNOSIS — Z79899 Other long term (current) drug therapy: Secondary | ICD-10-CM | POA: Diagnosis not present

## 2018-06-01 DIAGNOSIS — M0579 Rheumatoid arthritis with rheumatoid factor of multiple sites without organ or systems involvement: Secondary | ICD-10-CM | POA: Diagnosis not present

## 2018-06-30 DIAGNOSIS — D696 Thrombocytopenia, unspecified: Secondary | ICD-10-CM | POA: Diagnosis not present

## 2018-06-30 DIAGNOSIS — M0579 Rheumatoid arthritis with rheumatoid factor of multiple sites without organ or systems involvement: Secondary | ICD-10-CM | POA: Diagnosis not present

## 2018-07-03 DIAGNOSIS — Z6835 Body mass index (BMI) 35.0-35.9, adult: Secondary | ICD-10-CM | POA: Diagnosis not present

## 2018-07-03 DIAGNOSIS — M48061 Spinal stenosis, lumbar region without neurogenic claudication: Secondary | ICD-10-CM | POA: Diagnosis not present

## 2018-07-03 DIAGNOSIS — M150 Primary generalized (osteo)arthritis: Secondary | ICD-10-CM | POA: Diagnosis not present

## 2018-07-03 DIAGNOSIS — E669 Obesity, unspecified: Secondary | ICD-10-CM | POA: Diagnosis not present

## 2018-07-03 DIAGNOSIS — Z79899 Other long term (current) drug therapy: Secondary | ICD-10-CM | POA: Diagnosis not present

## 2018-07-03 DIAGNOSIS — D696 Thrombocytopenia, unspecified: Secondary | ICD-10-CM | POA: Diagnosis not present

## 2018-07-03 DIAGNOSIS — M0579 Rheumatoid arthritis with rheumatoid factor of multiple sites without organ or systems involvement: Secondary | ICD-10-CM | POA: Diagnosis not present

## 2018-07-03 DIAGNOSIS — M542 Cervicalgia: Secondary | ICD-10-CM | POA: Diagnosis not present

## 2018-07-03 DIAGNOSIS — R945 Abnormal results of liver function studies: Secondary | ICD-10-CM | POA: Diagnosis not present

## 2018-07-06 DIAGNOSIS — E559 Vitamin D deficiency, unspecified: Secondary | ICD-10-CM | POA: Diagnosis not present

## 2018-07-06 DIAGNOSIS — G2581 Restless legs syndrome: Secondary | ICD-10-CM | POA: Diagnosis not present

## 2018-07-06 DIAGNOSIS — K117 Disturbances of salivary secretion: Secondary | ICD-10-CM | POA: Diagnosis not present

## 2018-07-06 DIAGNOSIS — R7301 Impaired fasting glucose: Secondary | ICD-10-CM | POA: Diagnosis not present

## 2018-07-06 DIAGNOSIS — I5032 Chronic diastolic (congestive) heart failure: Secondary | ICD-10-CM | POA: Diagnosis not present

## 2018-07-06 DIAGNOSIS — R0602 Shortness of breath: Secondary | ICD-10-CM | POA: Diagnosis not present

## 2018-07-06 DIAGNOSIS — I10 Essential (primary) hypertension: Secondary | ICD-10-CM | POA: Diagnosis not present

## 2018-07-06 DIAGNOSIS — E782 Mixed hyperlipidemia: Secondary | ICD-10-CM | POA: Diagnosis not present

## 2018-07-06 DIAGNOSIS — R600 Localized edema: Secondary | ICD-10-CM | POA: Diagnosis not present

## 2018-07-06 DIAGNOSIS — M0579 Rheumatoid arthritis with rheumatoid factor of multiple sites without organ or systems involvement: Secondary | ICD-10-CM | POA: Diagnosis not present

## 2018-07-06 DIAGNOSIS — H04123 Dry eye syndrome of bilateral lacrimal glands: Secondary | ICD-10-CM | POA: Diagnosis not present

## 2018-07-14 DIAGNOSIS — I080 Rheumatic disorders of both mitral and aortic valves: Secondary | ICD-10-CM | POA: Diagnosis not present

## 2018-07-14 DIAGNOSIS — I5032 Chronic diastolic (congestive) heart failure: Secondary | ICD-10-CM | POA: Diagnosis not present

## 2018-07-14 DIAGNOSIS — R0602 Shortness of breath: Secondary | ICD-10-CM | POA: Diagnosis not present

## 2018-07-14 DIAGNOSIS — R740 Nonspecific elevation of levels of transaminase and lactic acid dehydrogenase [LDH]: Secondary | ICD-10-CM | POA: Diagnosis not present

## 2018-07-21 DIAGNOSIS — D696 Thrombocytopenia, unspecified: Secondary | ICD-10-CM | POA: Diagnosis not present

## 2018-07-21 DIAGNOSIS — Z79899 Other long term (current) drug therapy: Secondary | ICD-10-CM | POA: Diagnosis not present

## 2018-07-21 DIAGNOSIS — R945 Abnormal results of liver function studies: Secondary | ICD-10-CM | POA: Diagnosis not present

## 2018-07-21 DIAGNOSIS — M0579 Rheumatoid arthritis with rheumatoid factor of multiple sites without organ or systems involvement: Secondary | ICD-10-CM | POA: Diagnosis not present

## 2018-07-27 DIAGNOSIS — Z23 Encounter for immunization: Secondary | ICD-10-CM | POA: Diagnosis not present

## 2018-07-27 DIAGNOSIS — D6959 Other secondary thrombocytopenia: Secondary | ICD-10-CM | POA: Diagnosis not present

## 2018-07-27 DIAGNOSIS — I10 Essential (primary) hypertension: Secondary | ICD-10-CM | POA: Diagnosis not present

## 2018-07-27 DIAGNOSIS — R600 Localized edema: Secondary | ICD-10-CM | POA: Diagnosis not present

## 2018-07-27 DIAGNOSIS — R740 Nonspecific elevation of levels of transaminase and lactic acid dehydrogenase [LDH]: Secondary | ICD-10-CM | POA: Diagnosis not present

## 2018-08-11 DIAGNOSIS — L03115 Cellulitis of right lower limb: Secondary | ICD-10-CM | POA: Diagnosis not present

## 2018-08-11 DIAGNOSIS — D696 Thrombocytopenia, unspecified: Secondary | ICD-10-CM | POA: Diagnosis not present

## 2018-08-11 DIAGNOSIS — Z7901 Long term (current) use of anticoagulants: Secondary | ICD-10-CM | POA: Diagnosis not present

## 2018-08-11 DIAGNOSIS — M069 Rheumatoid arthritis, unspecified: Secondary | ICD-10-CM | POA: Diagnosis not present

## 2018-08-11 DIAGNOSIS — L03116 Cellulitis of left lower limb: Secondary | ICD-10-CM | POA: Diagnosis not present

## 2018-08-17 DIAGNOSIS — R600 Localized edema: Secondary | ICD-10-CM | POA: Diagnosis not present

## 2018-08-17 DIAGNOSIS — L03116 Cellulitis of left lower limb: Secondary | ICD-10-CM | POA: Diagnosis not present

## 2018-08-17 DIAGNOSIS — L03115 Cellulitis of right lower limb: Secondary | ICD-10-CM | POA: Diagnosis not present

## 2018-08-20 DIAGNOSIS — D6959 Other secondary thrombocytopenia: Secondary | ICD-10-CM | POA: Diagnosis not present

## 2018-08-20 DIAGNOSIS — L03116 Cellulitis of left lower limb: Secondary | ICD-10-CM | POA: Diagnosis not present

## 2018-08-20 DIAGNOSIS — F329 Major depressive disorder, single episode, unspecified: Secondary | ICD-10-CM | POA: Diagnosis not present

## 2018-08-20 DIAGNOSIS — Z90710 Acquired absence of both cervix and uterus: Secondary | ICD-10-CM | POA: Diagnosis not present

## 2018-08-20 DIAGNOSIS — M069 Rheumatoid arthritis, unspecified: Secondary | ICD-10-CM | POA: Diagnosis not present

## 2018-08-20 DIAGNOSIS — J4530 Mild persistent asthma, uncomplicated: Secondary | ICD-10-CM | POA: Diagnosis not present

## 2018-08-20 DIAGNOSIS — L03115 Cellulitis of right lower limb: Secondary | ICD-10-CM | POA: Diagnosis not present

## 2018-08-20 DIAGNOSIS — M150 Primary generalized (osteo)arthritis: Secondary | ICD-10-CM | POA: Diagnosis not present

## 2018-08-20 DIAGNOSIS — I5032 Chronic diastolic (congestive) heart failure: Secondary | ICD-10-CM | POA: Diagnosis not present

## 2018-08-20 DIAGNOSIS — M5126 Other intervertebral disc displacement, lumbar region: Secondary | ICD-10-CM | POA: Diagnosis not present

## 2018-08-20 DIAGNOSIS — I110 Hypertensive heart disease with heart failure: Secondary | ICD-10-CM | POA: Diagnosis not present

## 2018-08-20 DIAGNOSIS — I872 Venous insufficiency (chronic) (peripheral): Secondary | ICD-10-CM | POA: Diagnosis not present

## 2018-08-24 DIAGNOSIS — L03115 Cellulitis of right lower limb: Secondary | ICD-10-CM | POA: Diagnosis not present

## 2018-08-24 DIAGNOSIS — I110 Hypertensive heart disease with heart failure: Secondary | ICD-10-CM | POA: Diagnosis not present

## 2018-08-24 DIAGNOSIS — I5032 Chronic diastolic (congestive) heart failure: Secondary | ICD-10-CM | POA: Diagnosis not present

## 2018-08-24 DIAGNOSIS — L03116 Cellulitis of left lower limb: Secondary | ICD-10-CM | POA: Diagnosis not present

## 2018-08-24 DIAGNOSIS — I872 Venous insufficiency (chronic) (peripheral): Secondary | ICD-10-CM | POA: Diagnosis not present

## 2018-08-24 DIAGNOSIS — M069 Rheumatoid arthritis, unspecified: Secondary | ICD-10-CM | POA: Diagnosis not present

## 2018-08-25 DIAGNOSIS — M150 Primary generalized (osteo)arthritis: Secondary | ICD-10-CM | POA: Diagnosis not present

## 2018-08-25 DIAGNOSIS — R945 Abnormal results of liver function studies: Secondary | ICD-10-CM | POA: Diagnosis not present

## 2018-08-25 DIAGNOSIS — Z79899 Other long term (current) drug therapy: Secondary | ICD-10-CM | POA: Diagnosis not present

## 2018-08-25 DIAGNOSIS — M0579 Rheumatoid arthritis with rheumatoid factor of multiple sites without organ or systems involvement: Secondary | ICD-10-CM | POA: Diagnosis not present

## 2018-08-25 DIAGNOSIS — M48061 Spinal stenosis, lumbar region without neurogenic claudication: Secondary | ICD-10-CM | POA: Diagnosis not present

## 2018-08-25 DIAGNOSIS — D696 Thrombocytopenia, unspecified: Secondary | ICD-10-CM | POA: Diagnosis not present

## 2018-08-25 DIAGNOSIS — M542 Cervicalgia: Secondary | ICD-10-CM | POA: Diagnosis not present

## 2018-08-27 DIAGNOSIS — L03116 Cellulitis of left lower limb: Secondary | ICD-10-CM | POA: Diagnosis not present

## 2018-08-27 DIAGNOSIS — I5032 Chronic diastolic (congestive) heart failure: Secondary | ICD-10-CM | POA: Diagnosis not present

## 2018-08-27 DIAGNOSIS — M069 Rheumatoid arthritis, unspecified: Secondary | ICD-10-CM | POA: Diagnosis not present

## 2018-08-27 DIAGNOSIS — I110 Hypertensive heart disease with heart failure: Secondary | ICD-10-CM | POA: Diagnosis not present

## 2018-08-27 DIAGNOSIS — L03115 Cellulitis of right lower limb: Secondary | ICD-10-CM | POA: Diagnosis not present

## 2018-08-27 DIAGNOSIS — I872 Venous insufficiency (chronic) (peripheral): Secondary | ICD-10-CM | POA: Diagnosis not present

## 2018-08-30 DIAGNOSIS — I110 Hypertensive heart disease with heart failure: Secondary | ICD-10-CM | POA: Diagnosis not present

## 2018-08-30 DIAGNOSIS — L03116 Cellulitis of left lower limb: Secondary | ICD-10-CM | POA: Diagnosis not present

## 2018-08-30 DIAGNOSIS — I872 Venous insufficiency (chronic) (peripheral): Secondary | ICD-10-CM | POA: Diagnosis not present

## 2018-08-30 DIAGNOSIS — L03115 Cellulitis of right lower limb: Secondary | ICD-10-CM | POA: Diagnosis not present

## 2018-09-01 DIAGNOSIS — E782 Mixed hyperlipidemia: Secondary | ICD-10-CM | POA: Diagnosis not present

## 2018-09-01 DIAGNOSIS — R7301 Impaired fasting glucose: Secondary | ICD-10-CM | POA: Diagnosis not present

## 2018-09-01 DIAGNOSIS — D6959 Other secondary thrombocytopenia: Secondary | ICD-10-CM | POA: Diagnosis not present

## 2018-09-01 DIAGNOSIS — M150 Primary generalized (osteo)arthritis: Secondary | ICD-10-CM | POA: Diagnosis not present

## 2018-09-01 DIAGNOSIS — I5032 Chronic diastolic (congestive) heart failure: Secondary | ICD-10-CM | POA: Diagnosis not present

## 2018-09-01 DIAGNOSIS — N3001 Acute cystitis with hematuria: Secondary | ICD-10-CM | POA: Diagnosis not present

## 2018-09-01 DIAGNOSIS — R740 Nonspecific elevation of levels of transaminase and lactic acid dehydrogenase [LDH]: Secondary | ICD-10-CM | POA: Diagnosis not present

## 2018-09-01 DIAGNOSIS — M0579 Rheumatoid arthritis with rheumatoid factor of multiple sites without organ or systems involvement: Secondary | ICD-10-CM | POA: Diagnosis not present

## 2018-09-03 DIAGNOSIS — I872 Venous insufficiency (chronic) (peripheral): Secondary | ICD-10-CM | POA: Diagnosis not present

## 2018-09-03 DIAGNOSIS — L03116 Cellulitis of left lower limb: Secondary | ICD-10-CM | POA: Diagnosis not present

## 2018-09-03 DIAGNOSIS — M069 Rheumatoid arthritis, unspecified: Secondary | ICD-10-CM | POA: Diagnosis not present

## 2018-09-03 DIAGNOSIS — I5032 Chronic diastolic (congestive) heart failure: Secondary | ICD-10-CM | POA: Diagnosis not present

## 2018-09-03 DIAGNOSIS — I110 Hypertensive heart disease with heart failure: Secondary | ICD-10-CM | POA: Diagnosis not present

## 2018-09-03 DIAGNOSIS — L03115 Cellulitis of right lower limb: Secondary | ICD-10-CM | POA: Diagnosis not present

## 2018-09-09 DIAGNOSIS — L03116 Cellulitis of left lower limb: Secondary | ICD-10-CM | POA: Diagnosis not present

## 2018-09-09 DIAGNOSIS — I110 Hypertensive heart disease with heart failure: Secondary | ICD-10-CM | POA: Diagnosis not present

## 2018-09-09 DIAGNOSIS — M069 Rheumatoid arthritis, unspecified: Secondary | ICD-10-CM | POA: Diagnosis not present

## 2018-09-09 DIAGNOSIS — I5032 Chronic diastolic (congestive) heart failure: Secondary | ICD-10-CM | POA: Diagnosis not present

## 2018-09-09 DIAGNOSIS — I872 Venous insufficiency (chronic) (peripheral): Secondary | ICD-10-CM | POA: Diagnosis not present

## 2018-09-09 DIAGNOSIS — L03115 Cellulitis of right lower limb: Secondary | ICD-10-CM | POA: Diagnosis not present

## 2018-09-11 DIAGNOSIS — I110 Hypertensive heart disease with heart failure: Secondary | ICD-10-CM | POA: Diagnosis not present

## 2018-09-11 DIAGNOSIS — I872 Venous insufficiency (chronic) (peripheral): Secondary | ICD-10-CM | POA: Diagnosis not present

## 2018-09-11 DIAGNOSIS — L03116 Cellulitis of left lower limb: Secondary | ICD-10-CM | POA: Diagnosis not present

## 2018-09-11 DIAGNOSIS — I5032 Chronic diastolic (congestive) heart failure: Secondary | ICD-10-CM | POA: Diagnosis not present

## 2018-09-11 DIAGNOSIS — M069 Rheumatoid arthritis, unspecified: Secondary | ICD-10-CM | POA: Diagnosis not present

## 2018-09-11 DIAGNOSIS — L03115 Cellulitis of right lower limb: Secondary | ICD-10-CM | POA: Diagnosis not present

## 2018-09-28 DIAGNOSIS — M0579 Rheumatoid arthritis with rheumatoid factor of multiple sites without organ or systems involvement: Secondary | ICD-10-CM | POA: Diagnosis not present

## 2018-10-01 ENCOUNTER — Other Ambulatory Visit: Payer: Self-pay

## 2018-10-05 DIAGNOSIS — R945 Abnormal results of liver function studies: Secondary | ICD-10-CM | POA: Diagnosis not present

## 2018-10-05 DIAGNOSIS — M48061 Spinal stenosis, lumbar region without neurogenic claudication: Secondary | ICD-10-CM | POA: Diagnosis not present

## 2018-10-05 DIAGNOSIS — Z6834 Body mass index (BMI) 34.0-34.9, adult: Secondary | ICD-10-CM | POA: Diagnosis not present

## 2018-10-05 DIAGNOSIS — M0579 Rheumatoid arthritis with rheumatoid factor of multiple sites without organ or systems involvement: Secondary | ICD-10-CM | POA: Diagnosis not present

## 2018-10-05 DIAGNOSIS — D696 Thrombocytopenia, unspecified: Secondary | ICD-10-CM | POA: Diagnosis not present

## 2018-10-05 DIAGNOSIS — M150 Primary generalized (osteo)arthritis: Secondary | ICD-10-CM | POA: Diagnosis not present

## 2018-10-05 DIAGNOSIS — E669 Obesity, unspecified: Secondary | ICD-10-CM | POA: Diagnosis not present

## 2018-10-05 DIAGNOSIS — Z79899 Other long term (current) drug therapy: Secondary | ICD-10-CM | POA: Diagnosis not present

## 2018-10-05 DIAGNOSIS — M542 Cervicalgia: Secondary | ICD-10-CM | POA: Diagnosis not present

## 2018-10-07 DIAGNOSIS — R7301 Impaired fasting glucose: Secondary | ICD-10-CM | POA: Diagnosis not present

## 2018-10-07 DIAGNOSIS — E782 Mixed hyperlipidemia: Secondary | ICD-10-CM | POA: Diagnosis not present

## 2018-10-07 DIAGNOSIS — E559 Vitamin D deficiency, unspecified: Secondary | ICD-10-CM | POA: Diagnosis not present

## 2018-10-07 DIAGNOSIS — G2581 Restless legs syndrome: Secondary | ICD-10-CM | POA: Diagnosis not present

## 2018-10-07 DIAGNOSIS — I5032 Chronic diastolic (congestive) heart failure: Secondary | ICD-10-CM | POA: Diagnosis not present

## 2018-10-07 DIAGNOSIS — K5904 Chronic idiopathic constipation: Secondary | ICD-10-CM | POA: Diagnosis not present

## 2018-10-07 DIAGNOSIS — I10 Essential (primary) hypertension: Secondary | ICD-10-CM | POA: Diagnosis not present

## 2018-10-07 DIAGNOSIS — M0579 Rheumatoid arthritis with rheumatoid factor of multiple sites without organ or systems involvement: Secondary | ICD-10-CM | POA: Diagnosis not present

## 2018-10-12 DIAGNOSIS — I5032 Chronic diastolic (congestive) heart failure: Secondary | ICD-10-CM | POA: Diagnosis not present

## 2018-10-12 DIAGNOSIS — I10 Essential (primary) hypertension: Secondary | ICD-10-CM | POA: Diagnosis not present

## 2018-10-12 DIAGNOSIS — E782 Mixed hyperlipidemia: Secondary | ICD-10-CM | POA: Diagnosis not present

## 2018-10-12 DIAGNOSIS — M0579 Rheumatoid arthritis with rheumatoid factor of multiple sites without organ or systems involvement: Secondary | ICD-10-CM | POA: Diagnosis not present

## 2018-10-21 DIAGNOSIS — E668 Other obesity: Secondary | ICD-10-CM | POA: Diagnosis not present

## 2018-10-21 DIAGNOSIS — Z1231 Encounter for screening mammogram for malignant neoplasm of breast: Secondary | ICD-10-CM | POA: Diagnosis not present

## 2018-10-21 DIAGNOSIS — Z6834 Body mass index (BMI) 34.0-34.9, adult: Secondary | ICD-10-CM | POA: Diagnosis not present

## 2018-10-21 DIAGNOSIS — I129 Hypertensive chronic kidney disease with stage 1 through stage 4 chronic kidney disease, or unspecified chronic kidney disease: Secondary | ICD-10-CM | POA: Diagnosis not present

## 2018-10-21 DIAGNOSIS — Z0001 Encounter for general adult medical examination with abnormal findings: Secondary | ICD-10-CM | POA: Diagnosis not present

## 2018-10-27 DIAGNOSIS — M1711 Unilateral primary osteoarthritis, right knee: Secondary | ICD-10-CM | POA: Diagnosis not present

## 2018-10-27 DIAGNOSIS — M1712 Unilateral primary osteoarthritis, left knee: Secondary | ICD-10-CM | POA: Diagnosis not present

## 2018-10-28 DIAGNOSIS — R600 Localized edema: Secondary | ICD-10-CM | POA: Diagnosis not present

## 2018-10-28 DIAGNOSIS — N3000 Acute cystitis without hematuria: Secondary | ICD-10-CM | POA: Diagnosis not present

## 2018-11-17 DIAGNOSIS — M0579 Rheumatoid arthritis with rheumatoid factor of multiple sites without organ or systems involvement: Secondary | ICD-10-CM | POA: Diagnosis not present

## 2018-11-26 DIAGNOSIS — Z1231 Encounter for screening mammogram for malignant neoplasm of breast: Secondary | ICD-10-CM | POA: Diagnosis not present

## 2018-12-01 DIAGNOSIS — E782 Mixed hyperlipidemia: Secondary | ICD-10-CM | POA: Diagnosis not present

## 2018-12-01 DIAGNOSIS — I5032 Chronic diastolic (congestive) heart failure: Secondary | ICD-10-CM | POA: Diagnosis not present

## 2018-12-01 DIAGNOSIS — M0579 Rheumatoid arthritis with rheumatoid factor of multiple sites without organ or systems involvement: Secondary | ICD-10-CM | POA: Diagnosis not present

## 2019-01-05 DIAGNOSIS — M150 Primary generalized (osteo)arthritis: Secondary | ICD-10-CM | POA: Diagnosis not present

## 2019-01-05 DIAGNOSIS — M48061 Spinal stenosis, lumbar region without neurogenic claudication: Secondary | ICD-10-CM | POA: Diagnosis not present

## 2019-01-05 DIAGNOSIS — D696 Thrombocytopenia, unspecified: Secondary | ICD-10-CM | POA: Diagnosis not present

## 2019-01-05 DIAGNOSIS — M0579 Rheumatoid arthritis with rheumatoid factor of multiple sites without organ or systems involvement: Secondary | ICD-10-CM | POA: Diagnosis not present

## 2019-01-05 DIAGNOSIS — Z79899 Other long term (current) drug therapy: Secondary | ICD-10-CM | POA: Diagnosis not present

## 2019-01-05 DIAGNOSIS — M1990 Unspecified osteoarthritis, unspecified site: Secondary | ICD-10-CM | POA: Diagnosis not present

## 2019-01-05 DIAGNOSIS — M2550 Pain in unspecified joint: Secondary | ICD-10-CM | POA: Diagnosis not present

## 2019-01-12 DIAGNOSIS — M0579 Rheumatoid arthritis with rheumatoid factor of multiple sites without organ or systems involvement: Secondary | ICD-10-CM | POA: Diagnosis not present

## 2019-01-14 DIAGNOSIS — R7301 Impaired fasting glucose: Secondary | ICD-10-CM | POA: Diagnosis not present

## 2019-01-14 DIAGNOSIS — M0579 Rheumatoid arthritis with rheumatoid factor of multiple sites without organ or systems involvement: Secondary | ICD-10-CM | POA: Diagnosis not present

## 2019-01-14 DIAGNOSIS — G2581 Restless legs syndrome: Secondary | ICD-10-CM | POA: Diagnosis not present

## 2019-01-14 DIAGNOSIS — E559 Vitamin D deficiency, unspecified: Secondary | ICD-10-CM | POA: Diagnosis not present

## 2019-01-14 DIAGNOSIS — K5904 Chronic idiopathic constipation: Secondary | ICD-10-CM | POA: Diagnosis not present

## 2019-01-14 DIAGNOSIS — F331 Major depressive disorder, recurrent, moderate: Secondary | ICD-10-CM | POA: Diagnosis not present

## 2019-01-14 DIAGNOSIS — E782 Mixed hyperlipidemia: Secondary | ICD-10-CM | POA: Diagnosis not present

## 2019-01-14 DIAGNOSIS — I5032 Chronic diastolic (congestive) heart failure: Secondary | ICD-10-CM | POA: Diagnosis not present

## 2019-01-14 DIAGNOSIS — I10 Essential (primary) hypertension: Secondary | ICD-10-CM | POA: Diagnosis not present

## 2019-01-25 ENCOUNTER — Ambulatory Visit: Payer: Self-pay | Admitting: Podiatry

## 2019-01-28 ENCOUNTER — Other Ambulatory Visit: Payer: Self-pay

## 2019-01-29 ENCOUNTER — Other Ambulatory Visit: Payer: Self-pay

## 2019-01-29 ENCOUNTER — Encounter: Payer: Self-pay | Admitting: Sports Medicine

## 2019-01-29 ENCOUNTER — Other Ambulatory Visit: Payer: Self-pay | Admitting: Sports Medicine

## 2019-01-29 ENCOUNTER — Ambulatory Visit (INDEPENDENT_AMBULATORY_CARE_PROVIDER_SITE_OTHER): Payer: Medicare Other | Admitting: Sports Medicine

## 2019-01-29 ENCOUNTER — Ambulatory Visit (INDEPENDENT_AMBULATORY_CARE_PROVIDER_SITE_OTHER): Payer: Medicare Other

## 2019-01-29 VITALS — BP 140/68 | HR 82 | Temp 97.4°F | Resp 16 | Ht 61.0 in | Wt 173.0 lb

## 2019-01-29 DIAGNOSIS — M79671 Pain in right foot: Secondary | ICD-10-CM

## 2019-01-29 DIAGNOSIS — M19072 Primary osteoarthritis, left ankle and foot: Secondary | ICD-10-CM

## 2019-01-29 DIAGNOSIS — M79672 Pain in left foot: Secondary | ICD-10-CM | POA: Diagnosis not present

## 2019-01-29 DIAGNOSIS — M19071 Primary osteoarthritis, right ankle and foot: Secondary | ICD-10-CM

## 2019-01-29 DIAGNOSIS — M05772 Rheumatoid arthritis with rheumatoid factor of left ankle and foot without organ or systems involvement: Secondary | ICD-10-CM

## 2019-01-29 DIAGNOSIS — M05771 Rheumatoid arthritis with rheumatoid factor of right ankle and foot without organ or systems involvement: Secondary | ICD-10-CM

## 2019-01-29 DIAGNOSIS — M792 Neuralgia and neuritis, unspecified: Secondary | ICD-10-CM | POA: Diagnosis not present

## 2019-01-29 NOTE — Progress Notes (Signed)
Subjective: Sherry Burke is a 78 y.o. female patient who presents to office for evaluation of bilateral foot pain. Patient complains of progressive pain especially over the last 2 weeks that is slowly getting better pain was 8 out of 10 but now doing much better states that she started to cancel the appointment but then wanted to come in to have her feet checked.  Patient reports that pain was worse with standing has tried elevation does admit to a history of rheumatoid arthritis and leg pain and is on gabapentin which helps her legs tremendously patient denies nausea vomiting fever chills.  Patient denies any injury trip or fall or sprain or any other causative factors.  Review of Systems  Musculoskeletal: Positive for joint pain and myalgias.  All other systems reviewed and are negative.     Patient Active Problem List   Diagnosis Date Noted  . OSA (obstructive sleep apnea) 04/22/2012  . RLS (restless legs syndrome) 04/22/2012  . COPD (chronic obstructive pulmonary disease) ([place]) 03/18/2011    Current Outpatient Medications on File Prior to Visit  Medication Sig Dispense Refill  . albuterol (VENTOLIN HFA) 108 (90 BASE) MCG/ACT inhaler Inhale 1-2 puffs into the lungs every 4 (four) hours as needed for shortness of breath.     Marland Kitchen amLODipine (NORVASC) 5 MG tablet Take 5 mg by mouth daily.      Marland Kitchen atorvastatin (LIPITOR) 40 MG tablet     . Cephalexin 500 MG tablet Take 1 tablet (500 mg total) by mouth 4 (four) times daily. 20 tablet 0  . Cholecalciferol (VITAMIN D3) 2000 UNITS TABS Take 1 tablet by mouth daily.    . cloNIDine (CATAPRES) 0.1 MG tablet     . gabapentin (NEURONTIN) 300 MG capsule     . HYDROcodone-acetaminophen (NORCO/VICODIN) 5-325 MG tablet TAKE 1 TABLET BY MOUTH EVERY 12 HOURS AS NEEDED FOR ARTHRITIC PAIN    . hydroxychloroquine (PLAQUENIL) 200 MG tablet Take 200 mg by mouth 2 (two) times daily.    Marland Kitchen lisinopril (PRINIVIL,ZESTRIL) 10 MG tablet     . nabumetone (RELAFEN) 750 MG  tablet Take 750 mg by mouth 2 (two) times daily.      Marland Kitchen omeprazole (PRILOSEC) 40 MG capsule     . PARoxetine (PAXIL) 20 MG tablet Take 20 mg by mouth at bedtime.     . predniSONE (DELTASONE) 5 MG tablet TAKE 4 TABLETS BY MOUTH ONCE DAILY FOR 2 WEEKS THEN DECREASE TO 3 TABS DAILY    . rOPINIRole (REQUIP) 1 MG tablet 2 every morning and 3 at bedtime    . spironolactone (ALDACTONE) 25 MG tablet     . zolpidem (AMBIEN CR) 12.5 MG CR tablet Take 12.5 mg by mouth at bedtime.       No current facility-administered medications on file prior to visit.     Allergies  Allergen Reactions  . Remicade [Infliximab] Nausea And Vomiting and Other (See Comments)    Affects nervous system    Objective:  General: Alert and oriented x3 in no acute distress  Dermatology: No open lesions bilateral lower extremities, no webspace macerations, no ecchymosis bilateral, all nails x 10 are well manicured.  Vascular: Dorsalis Pedis and Posterior Tibial pedal pulses palpable, Capillary Fill Time 5 seconds,(-) pedal hair growth bilateral, minimal edema bilateral lower extremities, Temperature gradient within normal limits.  Varicosities bilateral.  Neurology: Gross sensation intact via light touch bilateral, subjective burning pain to feet and legs much improved with gabapentin.  Musculoskeletal: Mild tenderness with  palpation at dorsal midfoot with subjective burning pain that radiates from midfoot to ankles, range of motion severely limited due to history of arthritis with digital deformity.    Xrays  Right and left foot   Impression: Decreased osseous mineralization there is significant diffuse arthritis noted there is inferior calcaneal spur there is bunion and lesser hammertoe deformity.  No other acute findings.  Assessment and Plan: Problem List Items Addressed This Visit    None    Visit Diagnoses    Bilateral foot pain    -  Primary   Arthritis of foot, left       Arthritis of foot, right        Rheumatoid arthritis involving both feet with positive rheumatoid factor (HCC)       Neuritis           -Complete examination performed -Xrays reviewed -Discussed treatement options for likely neuritis with arthritis -Advised patient since she already has gabapentin at home may increase dose to 303 times per day to see if this will give her some additional relief -Advised gentle stretching and icing as needed and topical over-the-counter pain cream -Continue with good supportive shoes -Patient to return to office as needed or sooner if condition worsens.  Landis Martins, DPM

## 2019-03-03 DIAGNOSIS — M1712 Unilateral primary osteoarthritis, left knee: Secondary | ICD-10-CM | POA: Diagnosis not present

## 2019-03-03 DIAGNOSIS — M1711 Unilateral primary osteoarthritis, right knee: Secondary | ICD-10-CM | POA: Diagnosis not present

## 2019-03-09 DIAGNOSIS — M0579 Rheumatoid arthritis with rheumatoid factor of multiple sites without organ or systems involvement: Secondary | ICD-10-CM | POA: Diagnosis not present

## 2019-03-11 DIAGNOSIS — J4530 Mild persistent asthma, uncomplicated: Secondary | ICD-10-CM | POA: Diagnosis not present

## 2019-03-11 DIAGNOSIS — E782 Mixed hyperlipidemia: Secondary | ICD-10-CM | POA: Diagnosis not present

## 2019-03-11 DIAGNOSIS — I5032 Chronic diastolic (congestive) heart failure: Secondary | ICD-10-CM | POA: Diagnosis not present

## 2019-03-29 DIAGNOSIS — Z1159 Encounter for screening for other viral diseases: Secondary | ICD-10-CM | POA: Diagnosis not present

## 2019-03-29 DIAGNOSIS — M5013 Cervical disc disorder with radiculopathy, cervicothoracic region: Secondary | ICD-10-CM | POA: Diagnosis not present

## 2019-04-07 DIAGNOSIS — M79641 Pain in right hand: Secondary | ICD-10-CM | POA: Diagnosis not present

## 2019-04-07 DIAGNOSIS — M0579 Rheumatoid arthritis with rheumatoid factor of multiple sites without organ or systems involvement: Secondary | ICD-10-CM | POA: Diagnosis not present

## 2019-04-07 DIAGNOSIS — Z79899 Other long term (current) drug therapy: Secondary | ICD-10-CM | POA: Diagnosis not present

## 2019-04-07 DIAGNOSIS — M48061 Spinal stenosis, lumbar region without neurogenic claudication: Secondary | ICD-10-CM | POA: Diagnosis not present

## 2019-04-07 DIAGNOSIS — M150 Primary generalized (osteo)arthritis: Secondary | ICD-10-CM | POA: Diagnosis not present

## 2019-04-07 DIAGNOSIS — Z1159 Encounter for screening for other viral diseases: Secondary | ICD-10-CM | POA: Diagnosis not present

## 2019-04-07 DIAGNOSIS — Z6833 Body mass index (BMI) 33.0-33.9, adult: Secondary | ICD-10-CM | POA: Diagnosis not present

## 2019-04-07 DIAGNOSIS — D696 Thrombocytopenia, unspecified: Secondary | ICD-10-CM | POA: Diagnosis not present

## 2019-04-07 DIAGNOSIS — M5013 Cervical disc disorder with radiculopathy, cervicothoracic region: Secondary | ICD-10-CM | POA: Diagnosis not present

## 2019-04-07 DIAGNOSIS — M79642 Pain in left hand: Secondary | ICD-10-CM | POA: Diagnosis not present

## 2019-04-07 DIAGNOSIS — E669 Obesity, unspecified: Secondary | ICD-10-CM | POA: Diagnosis not present

## 2019-04-23 ENCOUNTER — Other Ambulatory Visit: Payer: Self-pay

## 2019-04-23 DIAGNOSIS — M79602 Pain in left arm: Secondary | ICD-10-CM

## 2019-04-23 DIAGNOSIS — M79601 Pain in right arm: Secondary | ICD-10-CM

## 2019-04-26 ENCOUNTER — Telehealth: Payer: Self-pay | Admitting: Neurology

## 2019-04-26 DIAGNOSIS — E559 Vitamin D deficiency, unspecified: Secondary | ICD-10-CM | POA: Diagnosis not present

## 2019-04-26 DIAGNOSIS — G542 Cervical root disorders, not elsewhere classified: Secondary | ICD-10-CM | POA: Diagnosis not present

## 2019-04-26 DIAGNOSIS — I10 Essential (primary) hypertension: Secondary | ICD-10-CM | POA: Diagnosis not present

## 2019-04-26 DIAGNOSIS — M0579 Rheumatoid arthritis with rheumatoid factor of multiple sites without organ or systems involvement: Secondary | ICD-10-CM | POA: Diagnosis not present

## 2019-04-26 DIAGNOSIS — R202 Paresthesia of skin: Secondary | ICD-10-CM | POA: Diagnosis not present

## 2019-04-26 DIAGNOSIS — R7301 Impaired fasting glucose: Secondary | ICD-10-CM | POA: Diagnosis not present

## 2019-04-26 DIAGNOSIS — E782 Mixed hyperlipidemia: Secondary | ICD-10-CM | POA: Diagnosis not present

## 2019-04-26 DIAGNOSIS — M4802 Spinal stenosis, cervical region: Secondary | ICD-10-CM | POA: Diagnosis not present

## 2019-04-26 DIAGNOSIS — I5032 Chronic diastolic (congestive) heart failure: Secondary | ICD-10-CM | POA: Diagnosis not present

## 2019-04-26 DIAGNOSIS — G2581 Restless legs syndrome: Secondary | ICD-10-CM | POA: Diagnosis not present

## 2019-04-26 NOTE — Telephone Encounter (Signed)
Dr. Posey Pronto are you ok making this pt a sooner appt?

## 2019-04-26 NOTE — Telephone Encounter (Signed)
Patient is scheduled for EMG BUE on 7/9. Pls inform referring practice that I do not have any sooner appointment, since we only recently restarted doing EMGs again.  We can add her to cancellation list for EMGs.   Cordae Mccarey K. Posey Pronto, DO

## 2019-04-26 NOTE — Telephone Encounter (Signed)
[name] at Dr. Tobie Poet office request sooner appt stating pt has increased pain, numbness, tingling. Verified pt contact ph on acct is correct.

## 2019-04-27 ENCOUNTER — Ambulatory Visit (INDEPENDENT_AMBULATORY_CARE_PROVIDER_SITE_OTHER): Payer: Medicare Other | Admitting: Neurology

## 2019-04-27 ENCOUNTER — Other Ambulatory Visit: Payer: Self-pay

## 2019-04-27 DIAGNOSIS — M79601 Pain in right arm: Secondary | ICD-10-CM | POA: Diagnosis not present

## 2019-04-27 DIAGNOSIS — M79602 Pain in left arm: Secondary | ICD-10-CM | POA: Diagnosis not present

## 2019-04-27 DIAGNOSIS — M5412 Radiculopathy, cervical region: Secondary | ICD-10-CM

## 2019-04-27 DIAGNOSIS — G5603 Carpal tunnel syndrome, bilateral upper limbs: Secondary | ICD-10-CM

## 2019-04-27 NOTE — Procedures (Signed)
[facility] Neurology  [address]  [place], [place] [address] Tel: [phone] Fax:  [phone] Test Date:  04/27/2019  Patient: Sherry Burke DOB: 1941-01-17 Physician: Narda Amber, DO  Sex: Female Height: 5\' 1"  Ref Phys: Dirk Dress, MD  ID#: [id number] Temp: 34.0C Technician:    Patient Complaints: This is a 78 year old female referred for evaluation of bilateral hand burning pain and tingling.  NCV & EMG Findings: Extensive electrodiagnostic testing of that right upper extremity and additional studies of the left shows: 1. Bilateral median sensory responses are absent.  Bilateral ulnar sensory responses are within normal limits. 2. Bilateral median motor responses show reduced amplitude (R4.5, L4.1 mV), with latency prolonged on the right side only (R4.2 ms).  Bilateral ulnar motor responses are mildly reduced (R6.8, L6.3 mV); in the absence of associated EMG abnormalities in ulnar/C8 myotomes, these findings may be normal for patient.  3. Chronic motor axonal loss changes are seen affecting bilateral abductor pollicis brevis muscles and C5-6 myotomes.  There is no evidence of accompanied active denervation.  Impression: 1. Bilateral median neuropathy at or distal to the wrist, consistent with a clinical diagnosis of carpal tunnel syndrome.  Overall, these findings are severe in degree electrically and worse on the right. 2. Chronic C5-6 radiculopathy affecting bilateral upper extremities, moderate in degree electrically.   ___________________________ Narda Amber, DO    Nerve Conduction Studies Anti Sensory Summary Table   Site NR Peak (ms) Norm Peak (ms) P-T Amp (V) Norm P-T Amp  Left Median Anti Sensory (2nd Digit)  34C  Wrist NR  <3.8  >10  Right Median Anti Sensory (2nd Digit)  34C  Wrist NR  <3.8  >10  Left Ulnar Anti Sensory (5th Digit)  34C  Wrist    2.9 <3.2 12.9 >5  Right Ulnar Anti Sensory (5th Digit)  34C  Wrist    2.9 <3.2 11.6 >5    Motor Summary Table   Site NR Onset (ms) Norm Onset (ms) O-P Amp (mV) Norm O-P Amp Site1 Site2 Delta-0 (ms) Dist (cm) Vel (m/s) Norm Vel (m/s)  Left Median Motor (Abd Poll Brev)  34C  Wrist    4.0 <4.0 4.1 >5 Elbow Wrist 4.8 24.0 50 >50  Elbow    8.8  3.9         Right Median Motor (Abd Poll Brev)  34C  Wrist    4.2 <4.0 4.5 >5 Elbow Wrist 4.8 28.0 58 >50  Elbow    9.0  4.0         Left Ulnar Motor (Abd Dig Minimi)  34C  Wrist    2.3 <3.1 6.8 >7 B Elbow Wrist 3.6 21.0 58 >50  B Elbow    5.9  6.3  A Elbow B Elbow 1.7 10.0 59 >50  A Elbow    7.6  5.8         Right Ulnar Motor (Abd Dig Minimi)  34C  Wrist    2.3 <3.1 6.3 >7 B Elbow Wrist 3.7 23.0 62 >50  B Elbow    6.0  5.3  A Elbow B Elbow 1.7 10.0 59 >50  A Elbow    7.7  4.9          EMG   Side Muscle Ins Act Fibs Psw Fasc Number Recrt Dur Dur. Amp Amp. Poly Poly. Comment  Right 1stDorInt Nml Nml Nml Nml Nml Nml Nml Nml Nml Nml Nml Nml N/A  Right Ext Indicis Nml Nml  Nml Nml Nml Nml Nml Nml Nml Nml Nml Nml N/A  Right Abd Poll Brev Nml Nml Nml Nml 2- Rapid Many 1+ Many 1+ Nml Nml N/A  Right PronatorTeres Nml Nml Nml Nml Nml Nml Nml Nml Nml Nml Nml Nml N/A  Right Biceps Nml Nml Nml Nml 1- Rapid Some 1+ Some 1+ Some 1+ N/A  Right Triceps Nml Nml Nml Nml Nml Nml Nml Nml Nml Nml Nml Nml N/A  Right Deltoid Nml Nml Nml Nml 1- Rapid Some 1+ Some 1+ Some 1+ N/A  Left 1stDorInt Nml Nml Nml Nml Nml Nml Nml Nml Nml Nml Nml Nml N/A  Left Abd Poll Brev Nml Nml Nml Nml 2- Rapid Many 1+ Many 1+ Nml Nml N/A  Left PronatorTeres Nml Nml Nml Nml Nml Nml Nml Nml Nml Nml Nml Nml N/A  Left Biceps Nml Nml Nml Nml 1- Rapid Some 1+ Some 1+ Some 1+ N/A  Left Triceps Nml Nml Nml Nml Nml Nml Nml Nml Nml Nml Nml Nml N/A  Left Deltoid Nml Nml Nml Nml 1- Rapid Some 1+ Some 1+ Some 1+ N/A      Waveforms:

## 2019-04-27 NOTE — Telephone Encounter (Signed)
Called Dr. Tobie Poet office left vm with referral coordinator informing no sooner appt available per previous note from Dr. Posey Pronto.

## 2019-04-28 DIAGNOSIS — M542 Cervicalgia: Secondary | ICD-10-CM | POA: Diagnosis not present

## 2019-04-28 DIAGNOSIS — G542 Cervical root disorders, not elsewhere classified: Secondary | ICD-10-CM | POA: Diagnosis not present

## 2019-05-04 DIAGNOSIS — Z79899 Other long term (current) drug therapy: Secondary | ICD-10-CM | POA: Diagnosis not present

## 2019-05-04 DIAGNOSIS — M0579 Rheumatoid arthritis with rheumatoid factor of multiple sites without organ or systems involvement: Secondary | ICD-10-CM | POA: Diagnosis not present

## 2019-05-10 DIAGNOSIS — M542 Cervicalgia: Secondary | ICD-10-CM | POA: Diagnosis not present

## 2019-05-10 DIAGNOSIS — G5600 Carpal tunnel syndrome, unspecified upper limb: Secondary | ICD-10-CM | POA: Diagnosis not present

## 2019-05-13 DIAGNOSIS — R030 Elevated blood-pressure reading, without diagnosis of hypertension: Secondary | ICD-10-CM | POA: Diagnosis not present

## 2019-05-13 DIAGNOSIS — Z6834 Body mass index (BMI) 34.0-34.9, adult: Secondary | ICD-10-CM | POA: Diagnosis not present

## 2019-05-13 DIAGNOSIS — G5600 Carpal tunnel syndrome, unspecified upper limb: Secondary | ICD-10-CM | POA: Diagnosis not present

## 2019-05-20 ENCOUNTER — Encounter: Payer: Medicare Other | Admitting: Neurology

## 2019-05-26 DIAGNOSIS — R102 Pelvic and perineal pain: Secondary | ICD-10-CM | POA: Diagnosis not present

## 2019-05-26 DIAGNOSIS — S79922A Unspecified injury of left thigh, initial encounter: Secondary | ICD-10-CM | POA: Diagnosis not present

## 2019-05-26 DIAGNOSIS — J45909 Unspecified asthma, uncomplicated: Secondary | ICD-10-CM | POA: Diagnosis not present

## 2019-05-26 DIAGNOSIS — R5381 Other malaise: Secondary | ICD-10-CM | POA: Diagnosis not present

## 2019-05-26 DIAGNOSIS — I10 Essential (primary) hypertension: Secondary | ICD-10-CM | POA: Diagnosis not present

## 2019-05-26 DIAGNOSIS — N179 Acute kidney failure, unspecified: Secondary | ICD-10-CM | POA: Diagnosis not present

## 2019-05-26 DIAGNOSIS — G8929 Other chronic pain: Secondary | ICD-10-CM | POA: Diagnosis present

## 2019-05-26 DIAGNOSIS — Z79899 Other long term (current) drug therapy: Secondary | ICD-10-CM | POA: Diagnosis not present

## 2019-05-26 DIAGNOSIS — F39 Unspecified mood [affective] disorder: Secondary | ICD-10-CM | POA: Diagnosis present

## 2019-05-26 DIAGNOSIS — M25462 Effusion, left knee: Secondary | ICD-10-CM | POA: Diagnosis not present

## 2019-05-26 DIAGNOSIS — F329 Major depressive disorder, single episode, unspecified: Secondary | ICD-10-CM | POA: Diagnosis not present

## 2019-05-26 DIAGNOSIS — S199XXA Unspecified injury of neck, initial encounter: Secondary | ICD-10-CM | POA: Diagnosis not present

## 2019-05-26 DIAGNOSIS — D638 Anemia in other chronic diseases classified elsewhere: Secondary | ICD-10-CM | POA: Diagnosis present

## 2019-05-26 DIAGNOSIS — M069 Rheumatoid arthritis, unspecified: Secondary | ICD-10-CM | POA: Diagnosis present

## 2019-05-26 DIAGNOSIS — Z7401 Bed confinement status: Secondary | ICD-10-CM | POA: Diagnosis not present

## 2019-05-26 DIAGNOSIS — R6520 Severe sepsis without septic shock: Secondary | ICD-10-CM | POA: Diagnosis not present

## 2019-05-26 DIAGNOSIS — J8410 Pulmonary fibrosis, unspecified: Secondary | ICD-10-CM | POA: Diagnosis not present

## 2019-05-26 DIAGNOSIS — K219 Gastro-esophageal reflux disease without esophagitis: Secondary | ICD-10-CM | POA: Diagnosis not present

## 2019-05-26 DIAGNOSIS — Z299 Encounter for prophylactic measures, unspecified: Secondary | ICD-10-CM | POA: Diagnosis not present

## 2019-05-26 DIAGNOSIS — G5600 Carpal tunnel syndrome, unspecified upper limb: Secondary | ICD-10-CM | POA: Diagnosis present

## 2019-05-26 DIAGNOSIS — E7800 Pure hypercholesterolemia, unspecified: Secondary | ICD-10-CM | POA: Diagnosis not present

## 2019-05-26 DIAGNOSIS — S0990XA Unspecified injury of head, initial encounter: Secondary | ICD-10-CM | POA: Diagnosis not present

## 2019-05-26 DIAGNOSIS — D631 Anemia in chronic kidney disease: Secondary | ICD-10-CM | POA: Diagnosis not present

## 2019-05-26 DIAGNOSIS — R0902 Hypoxemia: Secondary | ICD-10-CM | POA: Diagnosis not present

## 2019-05-26 DIAGNOSIS — R52 Pain, unspecified: Secondary | ICD-10-CM | POA: Diagnosis not present

## 2019-05-26 DIAGNOSIS — I129 Hypertensive chronic kidney disease with stage 1 through stage 4 chronic kidney disease, or unspecified chronic kidney disease: Secondary | ICD-10-CM | POA: Diagnosis present

## 2019-05-26 DIAGNOSIS — I509 Heart failure, unspecified: Secondary | ICD-10-CM | POA: Diagnosis not present

## 2019-05-26 DIAGNOSIS — R000 Tachycardia, unspecified: Secondary | ICD-10-CM | POA: Diagnosis not present

## 2019-05-26 DIAGNOSIS — Z729 Problem related to lifestyle, unspecified: Secondary | ICD-10-CM | POA: Diagnosis not present

## 2019-05-26 DIAGNOSIS — F419 Anxiety disorder, unspecified: Secondary | ICD-10-CM | POA: Diagnosis not present

## 2019-05-26 DIAGNOSIS — S79911A Unspecified injury of right hip, initial encounter: Secondary | ICD-10-CM | POA: Diagnosis not present

## 2019-05-26 DIAGNOSIS — S79912A Unspecified injury of left hip, initial encounter: Secondary | ICD-10-CM | POA: Diagnosis not present

## 2019-05-26 DIAGNOSIS — N3001 Acute cystitis with hematuria: Secondary | ICD-10-CM | POA: Diagnosis not present

## 2019-05-26 DIAGNOSIS — M25562 Pain in left knee: Secondary | ICD-10-CM | POA: Diagnosis not present

## 2019-05-26 DIAGNOSIS — E785 Hyperlipidemia, unspecified: Secondary | ICD-10-CM | POA: Diagnosis not present

## 2019-05-26 DIAGNOSIS — R531 Weakness: Secondary | ICD-10-CM | POA: Diagnosis not present

## 2019-05-26 DIAGNOSIS — M818 Other osteoporosis without current pathological fracture: Secondary | ICD-10-CM | POA: Diagnosis not present

## 2019-05-26 DIAGNOSIS — S3991XA Unspecified injury of abdomen, initial encounter: Secondary | ICD-10-CM | POA: Diagnosis not present

## 2019-05-26 DIAGNOSIS — G9341 Metabolic encephalopathy: Secondary | ICD-10-CM | POA: Diagnosis not present

## 2019-05-26 DIAGNOSIS — R410 Disorientation, unspecified: Secondary | ICD-10-CM | POA: Diagnosis not present

## 2019-05-26 DIAGNOSIS — N183 Chronic kidney disease, stage 3 (moderate): Secondary | ICD-10-CM | POA: Diagnosis present

## 2019-05-26 DIAGNOSIS — S299XXA Unspecified injury of thorax, initial encounter: Secondary | ICD-10-CM | POA: Diagnosis not present

## 2019-05-26 DIAGNOSIS — M25552 Pain in left hip: Secondary | ICD-10-CM | POA: Diagnosis present

## 2019-05-26 DIAGNOSIS — D649 Anemia, unspecified: Secondary | ICD-10-CM | POA: Diagnosis not present

## 2019-05-26 DIAGNOSIS — A419 Sepsis, unspecified organism: Secondary | ICD-10-CM | POA: Diagnosis not present

## 2019-05-26 DIAGNOSIS — B9620 Unspecified Escherichia coli [E. coli] as the cause of diseases classified elsewhere: Secondary | ICD-10-CM | POA: Diagnosis not present

## 2019-05-26 DIAGNOSIS — I959 Hypotension, unspecified: Secondary | ICD-10-CM | POA: Diagnosis not present

## 2019-05-26 DIAGNOSIS — N390 Urinary tract infection, site not specified: Secondary | ICD-10-CM | POA: Diagnosis not present

## 2019-05-26 DIAGNOSIS — W19XXXD Unspecified fall, subsequent encounter: Secondary | ICD-10-CM | POA: Diagnosis not present

## 2019-05-26 DIAGNOSIS — M4316 Spondylolisthesis, lumbar region: Secondary | ICD-10-CM | POA: Diagnosis present

## 2019-05-27 DIAGNOSIS — N3001 Acute cystitis with hematuria: Secondary | ICD-10-CM

## 2019-05-27 DIAGNOSIS — G9341 Metabolic encephalopathy: Secondary | ICD-10-CM

## 2019-05-27 DIAGNOSIS — J45909 Unspecified asthma, uncomplicated: Secondary | ICD-10-CM

## 2019-05-27 DIAGNOSIS — E7800 Pure hypercholesterolemia, unspecified: Secondary | ICD-10-CM

## 2019-06-03 DIAGNOSIS — G5603 Carpal tunnel syndrome, bilateral upper limbs: Secondary | ICD-10-CM | POA: Diagnosis not present

## 2019-06-03 DIAGNOSIS — Z7401 Bed confinement status: Secondary | ICD-10-CM | POA: Diagnosis not present

## 2019-06-03 DIAGNOSIS — F419 Anxiety disorder, unspecified: Secondary | ICD-10-CM | POA: Diagnosis not present

## 2019-06-03 DIAGNOSIS — N3001 Acute cystitis with hematuria: Secondary | ICD-10-CM | POA: Diagnosis not present

## 2019-06-03 DIAGNOSIS — J45909 Unspecified asthma, uncomplicated: Secondary | ICD-10-CM | POA: Diagnosis not present

## 2019-06-03 DIAGNOSIS — M25562 Pain in left knee: Secondary | ICD-10-CM | POA: Diagnosis not present

## 2019-06-03 DIAGNOSIS — R531 Weakness: Secondary | ICD-10-CM | POA: Diagnosis not present

## 2019-06-03 DIAGNOSIS — G5600 Carpal tunnel syndrome, unspecified upper limb: Secondary | ICD-10-CM | POA: Diagnosis not present

## 2019-06-03 DIAGNOSIS — M810 Age-related osteoporosis without current pathological fracture: Secondary | ICD-10-CM | POA: Diagnosis not present

## 2019-06-03 DIAGNOSIS — I119 Hypertensive heart disease without heart failure: Secondary | ICD-10-CM | POA: Diagnosis not present

## 2019-06-03 DIAGNOSIS — B9620 Unspecified Escherichia coli [E. coli] as the cause of diseases classified elsewhere: Secondary | ICD-10-CM | POA: Diagnosis not present

## 2019-06-03 DIAGNOSIS — E7800 Pure hypercholesterolemia, unspecified: Secondary | ICD-10-CM | POA: Diagnosis not present

## 2019-06-03 DIAGNOSIS — M818 Other osteoporosis without current pathological fracture: Secondary | ICD-10-CM | POA: Diagnosis not present

## 2019-06-03 DIAGNOSIS — M069 Rheumatoid arthritis, unspecified: Secondary | ICD-10-CM | POA: Diagnosis not present

## 2019-06-03 DIAGNOSIS — D649 Anemia, unspecified: Secondary | ICD-10-CM | POA: Diagnosis not present

## 2019-06-03 DIAGNOSIS — M79641 Pain in right hand: Secondary | ICD-10-CM | POA: Diagnosis not present

## 2019-06-03 DIAGNOSIS — A419 Sepsis, unspecified organism: Secondary | ICD-10-CM | POA: Diagnosis not present

## 2019-06-03 DIAGNOSIS — W19XXXD Unspecified fall, subsequent encounter: Secondary | ICD-10-CM | POA: Diagnosis not present

## 2019-06-03 DIAGNOSIS — N183 Chronic kidney disease, stage 3 (moderate): Secondary | ICD-10-CM | POA: Diagnosis not present

## 2019-06-03 DIAGNOSIS — D631 Anemia in chronic kidney disease: Secondary | ICD-10-CM | POA: Diagnosis not present

## 2019-06-03 DIAGNOSIS — F329 Major depressive disorder, single episode, unspecified: Secondary | ICD-10-CM | POA: Diagnosis not present

## 2019-06-03 DIAGNOSIS — E785 Hyperlipidemia, unspecified: Secondary | ICD-10-CM | POA: Diagnosis not present

## 2019-06-03 DIAGNOSIS — N179 Acute kidney failure, unspecified: Secondary | ICD-10-CM | POA: Diagnosis not present

## 2019-06-03 DIAGNOSIS — R5381 Other malaise: Secondary | ICD-10-CM | POA: Diagnosis not present

## 2019-06-03 DIAGNOSIS — I10 Essential (primary) hypertension: Secondary | ICD-10-CM | POA: Diagnosis not present

## 2019-06-03 DIAGNOSIS — R262 Difficulty in walking, not elsewhere classified: Secondary | ICD-10-CM | POA: Diagnosis not present

## 2019-06-03 DIAGNOSIS — M79642 Pain in left hand: Secondary | ICD-10-CM | POA: Diagnosis not present

## 2019-06-03 DIAGNOSIS — N390 Urinary tract infection, site not specified: Secondary | ICD-10-CM | POA: Diagnosis not present

## 2019-06-03 DIAGNOSIS — G9341 Metabolic encephalopathy: Secondary | ICD-10-CM | POA: Diagnosis not present

## 2019-06-04 DIAGNOSIS — D649 Anemia, unspecified: Secondary | ICD-10-CM | POA: Diagnosis not present

## 2019-06-04 DIAGNOSIS — R262 Difficulty in walking, not elsewhere classified: Secondary | ICD-10-CM | POA: Diagnosis not present

## 2019-06-04 DIAGNOSIS — M810 Age-related osteoporosis without current pathological fracture: Secondary | ICD-10-CM | POA: Diagnosis not present

## 2019-06-04 DIAGNOSIS — I119 Hypertensive heart disease without heart failure: Secondary | ICD-10-CM | POA: Diagnosis not present

## 2019-06-09 ENCOUNTER — Other Ambulatory Visit: Payer: Self-pay | Admitting: *Deleted

## 2019-06-09 NOTE — Patient Outreach (Signed)
Member assessed for potential [facility] Care Management needs as a benefit of  [place] Medicare.  Member is currently receiving rehab therapy at [facility].  Member discussed in weekly telephonic IDT meeting with facility staff, [facility] UM RN, [facility] UM MD Director, and writer.  Facility reports member is awaiting another knee replacement. She is from home husband. Pain has been an issue.   Writer will continue to follow for potential [facility] Care Management needs, progression, and for disposition plans.  Will continue to collaborate with [facility] UM team and facility on member.    [name], MSN-Ed, RN,BSN [place] Acute Care Coordinator [phone] [facility]) [phone]  (Toll free office)

## 2019-06-10 DIAGNOSIS — M79641 Pain in right hand: Secondary | ICD-10-CM | POA: Diagnosis not present

## 2019-06-10 DIAGNOSIS — G5603 Carpal tunnel syndrome, bilateral upper limbs: Secondary | ICD-10-CM | POA: Diagnosis not present

## 2019-06-10 DIAGNOSIS — M79642 Pain in left hand: Secondary | ICD-10-CM | POA: Diagnosis not present

## 2019-06-16 ENCOUNTER — Other Ambulatory Visit: Payer: Self-pay | Admitting: *Deleted

## 2019-06-16 NOTE — Patient Outreach (Signed)
Member assessed for potential [facility] Care Management needs as a benefit of  [place] Medicare.  Member is currently receiving rehab therapy at [facility].  Member discussed in weekly telephonic IDT meeting with  facility staff, [facility] UM team, and writer.  Facility reports Sherry Burke discharged from [facility] to home today with her husband.   Writer will plan to contact Mrs. Faubert to discuss [place] Management services.   [name], MSN-Ed, RN,BSN [place] Acute Care Coordinator [phone] [facility]) [phone]  (Toll free office)

## 2019-06-17 ENCOUNTER — Other Ambulatory Visit: Payer: Self-pay | Admitting: *Deleted

## 2019-06-17 DIAGNOSIS — M810 Age-related osteoporosis without current pathological fracture: Secondary | ICD-10-CM | POA: Diagnosis not present

## 2019-06-17 DIAGNOSIS — G5600 Carpal tunnel syndrome, unspecified upper limb: Secondary | ICD-10-CM | POA: Diagnosis not present

## 2019-06-17 DIAGNOSIS — M069 Rheumatoid arthritis, unspecified: Secondary | ICD-10-CM | POA: Diagnosis not present

## 2019-06-17 DIAGNOSIS — R296 Repeated falls: Secondary | ICD-10-CM | POA: Diagnosis not present

## 2019-06-17 DIAGNOSIS — Z79891 Long term (current) use of opiate analgesic: Secondary | ICD-10-CM | POA: Diagnosis not present

## 2019-06-17 DIAGNOSIS — G629 Polyneuropathy, unspecified: Secondary | ICD-10-CM | POA: Diagnosis not present

## 2019-06-17 DIAGNOSIS — G8929 Other chronic pain: Secondary | ICD-10-CM | POA: Diagnosis not present

## 2019-06-17 DIAGNOSIS — R269 Unspecified abnormalities of gait and mobility: Secondary | ICD-10-CM | POA: Diagnosis not present

## 2019-06-17 DIAGNOSIS — Z90710 Acquired absence of both cervix and uterus: Secondary | ICD-10-CM | POA: Diagnosis not present

## 2019-06-17 DIAGNOSIS — K219 Gastro-esophageal reflux disease without esophagitis: Secondary | ICD-10-CM | POA: Diagnosis not present

## 2019-06-17 DIAGNOSIS — Z79899 Other long term (current) drug therapy: Secondary | ICD-10-CM | POA: Diagnosis not present

## 2019-06-17 DIAGNOSIS — F329 Major depressive disorder, single episode, unspecified: Secondary | ICD-10-CM | POA: Diagnosis not present

## 2019-06-17 DIAGNOSIS — Z9181 History of falling: Secondary | ICD-10-CM | POA: Diagnosis not present

## 2019-06-17 DIAGNOSIS — R5381 Other malaise: Secondary | ICD-10-CM | POA: Diagnosis not present

## 2019-06-17 DIAGNOSIS — M25562 Pain in left knee: Secondary | ICD-10-CM | POA: Diagnosis not present

## 2019-06-17 DIAGNOSIS — E785 Hyperlipidemia, unspecified: Secondary | ICD-10-CM | POA: Diagnosis not present

## 2019-06-17 DIAGNOSIS — J45909 Unspecified asthma, uncomplicated: Secondary | ICD-10-CM | POA: Diagnosis not present

## 2019-06-17 DIAGNOSIS — N183 Chronic kidney disease, stage 3 (moderate): Secondary | ICD-10-CM | POA: Diagnosis not present

## 2019-06-17 DIAGNOSIS — I129 Hypertensive chronic kidney disease with stage 1 through stage 4 chronic kidney disease, or unspecified chronic kidney disease: Secondary | ICD-10-CM | POA: Diagnosis not present

## 2019-06-17 DIAGNOSIS — D631 Anemia in chronic kidney disease: Secondary | ICD-10-CM | POA: Diagnosis not present

## 2019-06-17 DIAGNOSIS — F419 Anxiety disorder, unspecified: Secondary | ICD-10-CM | POA: Diagnosis not present

## 2019-06-17 NOTE — Patient Outreach (Signed)
Member assessed for potential [facility] Care Management needs as a benefit of [place] Medicare.  Sherry Burke discharged home from [place] [facility] on 06/16/19.  Telephone call made to Sherry Burke at [phone] to discuss [place] Management services. Patient identifiers confirmed.   Sherry Burke reports she is coming along fine since dc from SNF on yesterday. She denies having any [facility] Care Management needs. She reports home health has been helping her and she feels like that is suffice. Sherry Burke also has husband who assists.  Expressed appreciation of the call.  Writer to sign off. [facility] Care Management services were declined at this time.   [name], MSN-Ed, RN,BSN [place] Acute Care Coordinator [phone] [facility]) [phone]  (Toll free office)

## 2019-06-19 DIAGNOSIS — F329 Major depressive disorder, single episode, unspecified: Secondary | ICD-10-CM | POA: Diagnosis not present

## 2019-06-19 DIAGNOSIS — M069 Rheumatoid arthritis, unspecified: Secondary | ICD-10-CM | POA: Diagnosis not present

## 2019-06-19 DIAGNOSIS — J45909 Unspecified asthma, uncomplicated: Secondary | ICD-10-CM | POA: Diagnosis not present

## 2019-06-19 DIAGNOSIS — I129 Hypertensive chronic kidney disease with stage 1 through stage 4 chronic kidney disease, or unspecified chronic kidney disease: Secondary | ICD-10-CM | POA: Diagnosis not present

## 2019-06-19 DIAGNOSIS — N183 Chronic kidney disease, stage 3 (moderate): Secondary | ICD-10-CM | POA: Diagnosis not present

## 2019-06-19 DIAGNOSIS — G629 Polyneuropathy, unspecified: Secondary | ICD-10-CM | POA: Diagnosis not present

## 2019-06-22 DIAGNOSIS — G9349 Other encephalopathy: Secondary | ICD-10-CM | POA: Diagnosis not present

## 2019-06-22 DIAGNOSIS — R531 Weakness: Secondary | ICD-10-CM | POA: Diagnosis not present

## 2019-06-22 DIAGNOSIS — N3001 Acute cystitis with hematuria: Secondary | ICD-10-CM | POA: Diagnosis not present

## 2019-06-22 DIAGNOSIS — N3000 Acute cystitis without hematuria: Secondary | ICD-10-CM | POA: Diagnosis not present

## 2019-06-23 DIAGNOSIS — R9431 Abnormal electrocardiogram [ECG] [EKG]: Secondary | ICD-10-CM | POA: Diagnosis not present

## 2019-06-23 DIAGNOSIS — N183 Chronic kidney disease, stage 3 (moderate): Secondary | ICD-10-CM | POA: Diagnosis not present

## 2019-06-23 DIAGNOSIS — R0902 Hypoxemia: Secondary | ICD-10-CM | POA: Diagnosis not present

## 2019-06-23 DIAGNOSIS — B9689 Other specified bacterial agents as the cause of diseases classified elsewhere: Secondary | ICD-10-CM | POA: Diagnosis not present

## 2019-06-23 DIAGNOSIS — F329 Major depressive disorder, single episode, unspecified: Secondary | ICD-10-CM | POA: Diagnosis not present

## 2019-06-23 DIAGNOSIS — I129 Hypertensive chronic kidney disease with stage 1 through stage 4 chronic kidney disease, or unspecified chronic kidney disease: Secondary | ICD-10-CM | POA: Diagnosis not present

## 2019-06-23 DIAGNOSIS — N3001 Acute cystitis with hematuria: Secondary | ICD-10-CM | POA: Diagnosis not present

## 2019-06-23 DIAGNOSIS — E860 Dehydration: Secondary | ICD-10-CM | POA: Diagnosis not present

## 2019-06-23 DIAGNOSIS — R112 Nausea with vomiting, unspecified: Secondary | ICD-10-CM | POA: Diagnosis not present

## 2019-06-23 DIAGNOSIS — R1111 Vomiting without nausea: Secondary | ICD-10-CM | POA: Diagnosis not present

## 2019-06-23 DIAGNOSIS — R5381 Other malaise: Secondary | ICD-10-CM | POA: Diagnosis not present

## 2019-06-23 DIAGNOSIS — R110 Nausea: Secondary | ICD-10-CM | POA: Diagnosis not present

## 2019-06-23 DIAGNOSIS — M069 Rheumatoid arthritis, unspecified: Secondary | ICD-10-CM | POA: Diagnosis not present

## 2019-06-24 DIAGNOSIS — N183 Chronic kidney disease, stage 3 (moderate): Secondary | ICD-10-CM | POA: Diagnosis not present

## 2019-06-24 DIAGNOSIS — I129 Hypertensive chronic kidney disease with stage 1 through stage 4 chronic kidney disease, or unspecified chronic kidney disease: Secondary | ICD-10-CM | POA: Diagnosis not present

## 2019-06-24 DIAGNOSIS — M069 Rheumatoid arthritis, unspecified: Secondary | ICD-10-CM | POA: Diagnosis not present

## 2019-06-24 DIAGNOSIS — J45909 Unspecified asthma, uncomplicated: Secondary | ICD-10-CM | POA: Diagnosis not present

## 2019-06-24 DIAGNOSIS — G629 Polyneuropathy, unspecified: Secondary | ICD-10-CM | POA: Diagnosis not present

## 2019-06-24 DIAGNOSIS — F329 Major depressive disorder, single episode, unspecified: Secondary | ICD-10-CM | POA: Diagnosis not present

## 2019-06-25 DIAGNOSIS — I129 Hypertensive chronic kidney disease with stage 1 through stage 4 chronic kidney disease, or unspecified chronic kidney disease: Secondary | ICD-10-CM | POA: Diagnosis not present

## 2019-06-25 DIAGNOSIS — F329 Major depressive disorder, single episode, unspecified: Secondary | ICD-10-CM | POA: Diagnosis not present

## 2019-06-25 DIAGNOSIS — J45909 Unspecified asthma, uncomplicated: Secondary | ICD-10-CM | POA: Diagnosis not present

## 2019-06-25 DIAGNOSIS — G629 Polyneuropathy, unspecified: Secondary | ICD-10-CM | POA: Diagnosis not present

## 2019-06-25 DIAGNOSIS — N183 Chronic kidney disease, stage 3 (moderate): Secondary | ICD-10-CM | POA: Diagnosis not present

## 2019-06-25 DIAGNOSIS — M069 Rheumatoid arthritis, unspecified: Secondary | ICD-10-CM | POA: Diagnosis not present

## 2019-06-29 DIAGNOSIS — G629 Polyneuropathy, unspecified: Secondary | ICD-10-CM | POA: Diagnosis not present

## 2019-06-29 DIAGNOSIS — F329 Major depressive disorder, single episode, unspecified: Secondary | ICD-10-CM | POA: Diagnosis not present

## 2019-06-29 DIAGNOSIS — N183 Chronic kidney disease, stage 3 (moderate): Secondary | ICD-10-CM | POA: Diagnosis not present

## 2019-06-29 DIAGNOSIS — J45909 Unspecified asthma, uncomplicated: Secondary | ICD-10-CM | POA: Diagnosis not present

## 2019-06-29 DIAGNOSIS — M069 Rheumatoid arthritis, unspecified: Secondary | ICD-10-CM | POA: Diagnosis not present

## 2019-06-29 DIAGNOSIS — I129 Hypertensive chronic kidney disease with stage 1 through stage 4 chronic kidney disease, or unspecified chronic kidney disease: Secondary | ICD-10-CM | POA: Diagnosis not present

## 2019-07-01 DIAGNOSIS — I129 Hypertensive chronic kidney disease with stage 1 through stage 4 chronic kidney disease, or unspecified chronic kidney disease: Secondary | ICD-10-CM | POA: Diagnosis not present

## 2019-07-01 DIAGNOSIS — J45909 Unspecified asthma, uncomplicated: Secondary | ICD-10-CM | POA: Diagnosis not present

## 2019-07-01 DIAGNOSIS — D485 Neoplasm of uncertain behavior of skin: Secondary | ICD-10-CM | POA: Diagnosis not present

## 2019-07-01 DIAGNOSIS — F329 Major depressive disorder, single episode, unspecified: Secondary | ICD-10-CM | POA: Diagnosis not present

## 2019-07-01 DIAGNOSIS — M069 Rheumatoid arthritis, unspecified: Secondary | ICD-10-CM | POA: Diagnosis not present

## 2019-07-01 DIAGNOSIS — L308 Other specified dermatitis: Secondary | ICD-10-CM | POA: Diagnosis not present

## 2019-07-01 DIAGNOSIS — L570 Actinic keratosis: Secondary | ICD-10-CM | POA: Diagnosis not present

## 2019-07-01 DIAGNOSIS — N183 Chronic kidney disease, stage 3 (moderate): Secondary | ICD-10-CM | POA: Diagnosis not present

## 2019-07-01 DIAGNOSIS — L300 Nummular dermatitis: Secondary | ICD-10-CM | POA: Diagnosis not present

## 2019-07-01 DIAGNOSIS — G629 Polyneuropathy, unspecified: Secondary | ICD-10-CM | POA: Diagnosis not present

## 2019-07-02 DIAGNOSIS — N3000 Acute cystitis without hematuria: Secondary | ICD-10-CM | POA: Diagnosis not present

## 2019-07-06 DIAGNOSIS — I129 Hypertensive chronic kidney disease with stage 1 through stage 4 chronic kidney disease, or unspecified chronic kidney disease: Secondary | ICD-10-CM | POA: Diagnosis not present

## 2019-07-06 DIAGNOSIS — G629 Polyneuropathy, unspecified: Secondary | ICD-10-CM | POA: Diagnosis not present

## 2019-07-06 DIAGNOSIS — N183 Chronic kidney disease, stage 3 (moderate): Secondary | ICD-10-CM | POA: Diagnosis not present

## 2019-07-06 DIAGNOSIS — M069 Rheumatoid arthritis, unspecified: Secondary | ICD-10-CM | POA: Diagnosis not present

## 2019-07-06 DIAGNOSIS — J45909 Unspecified asthma, uncomplicated: Secondary | ICD-10-CM | POA: Diagnosis not present

## 2019-07-06 DIAGNOSIS — F329 Major depressive disorder, single episode, unspecified: Secondary | ICD-10-CM | POA: Diagnosis not present

## 2019-07-09 ENCOUNTER — Other Ambulatory Visit: Payer: Self-pay | Admitting: *Deleted

## 2019-07-09 DIAGNOSIS — Z6833 Body mass index (BMI) 33.0-33.9, adult: Secondary | ICD-10-CM | POA: Diagnosis not present

## 2019-07-09 DIAGNOSIS — M150 Primary generalized (osteo)arthritis: Secondary | ICD-10-CM | POA: Diagnosis not present

## 2019-07-09 DIAGNOSIS — M79641 Pain in right hand: Secondary | ICD-10-CM | POA: Diagnosis not present

## 2019-07-09 DIAGNOSIS — M79642 Pain in left hand: Secondary | ICD-10-CM | POA: Diagnosis not present

## 2019-07-09 DIAGNOSIS — Z79899 Other long term (current) drug therapy: Secondary | ICD-10-CM | POA: Diagnosis not present

## 2019-07-09 DIAGNOSIS — D696 Thrombocytopenia, unspecified: Secondary | ICD-10-CM | POA: Diagnosis not present

## 2019-07-09 DIAGNOSIS — M0579 Rheumatoid arthritis with rheumatoid factor of multiple sites without organ or systems involvement: Secondary | ICD-10-CM | POA: Diagnosis not present

## 2019-07-09 DIAGNOSIS — E669 Obesity, unspecified: Secondary | ICD-10-CM | POA: Diagnosis not present

## 2019-07-09 DIAGNOSIS — I10 Essential (primary) hypertension: Secondary | ICD-10-CM

## 2019-07-09 DIAGNOSIS — M48061 Spinal stenosis, lumbar region without neurogenic claudication: Secondary | ICD-10-CM | POA: Diagnosis not present

## 2019-07-09 NOTE — Patient Outreach (Addendum)
Member assessed for potential [facility] Care Management needs as a benefit of [place] Medicare.  Made aware by [facility] Acute MD Director that Mrs. Penland recently went to ED after recent discharge from [facility].   Writer contacted member on 06/17/19 to discuss [place] Management. However, she declined services at that time. Due to readmit to ED post SNF will make [place] Management referral due to increase risk of readmit.   Mrs. Sheen is also active with [facility].  Per chart records she has a history of HTN, asthma, UTI, rheumatoid arthritis, major depressive disorder, CHF, HLD.   She lives with husband. Recent dc from [facility] on 06/16/19 and recent ED visit at [facility] on 06/23/19 per Patient Ping.    [name], MSN-Ed, RN,BSN [place] Acute Care Coordinator [phone] [facility]) [phone]  (Toll free office)

## 2019-07-12 ENCOUNTER — Encounter: Payer: Self-pay | Admitting: *Deleted

## 2019-07-12 ENCOUNTER — Other Ambulatory Visit: Payer: Self-pay | Admitting: *Deleted

## 2019-07-12 NOTE — Patient Outreach (Signed)
New pt for complex care management. Mrs. Brandenburger has been in the hospital for urinary sepsis and followed by a SNF stay. Subsequently she needed to go back to the ED for dehydration.  She says she is much better. She is waiting now to be scheduled for a LTKR. She is currently w/c bound and very high risk for falls.  She resides with her husband and they have help from their 2 children frequently.  Outpatient Encounter Medications as of 07/12/2019  Medication Sig  . albuterol (VENTOLIN HFA) 108 (90 BASE) MCG/ACT inhaler Inhale 1-2 puffs into the lungs every 4 (four) hours as needed for shortness of breath.   Marland Kitchen amLODipine (NORVASC) 5 MG tablet Take 5 mg by mouth daily.    Marland Kitchen atorvastatin (LIPITOR) 40 MG tablet   . Cholecalciferol (VITAMIN D3) 2000 UNITS TABS Take 1 tablet by mouth daily.  . cloNIDine (CATAPRES) 0.1 MG tablet   . gabapentin (NEURONTIN) 300 MG capsule   . HYDROcodone-acetaminophen (NORCO/VICODIN) 5-325 MG tablet TAKE 1 TABLET BY MOUTH EVERY 12 HOURS AS NEEDED FOR ARTHRITIC PAIN  . hydroxychloroquine (PLAQUENIL) 200 MG tablet Take 200 mg by mouth 2 (two) times daily.  Marland Kitchen lisinopril (PRINIVIL,ZESTRIL) 10 MG tablet   . nabumetone (RELAFEN) 750 MG tablet Take 750 mg by mouth 2 (two) times daily.    Marland Kitchen omeprazole (PRILOSEC) 40 MG capsule   . PARoxetine (PAXIL) 20 MG tablet Take 20 mg by mouth at bedtime.   Marland Kitchen rOPINIRole (REQUIP) 1 MG tablet 2 every morning and 3 at bedtime  . spironolactone (ALDACTONE) 25 MG tablet   . zolpidem (AMBIEN CR) 12.5 MG CR tablet Take 12.5 mg by mouth at bedtime.    . [DISCONTINUED] Cephalexin 500 MG tablet Take 1 tablet (500 mg total) by mouth 4 (four) times daily. (Patient not taking: Reported on 07/12/2019)  . [DISCONTINUED] predniSONE (DELTASONE) 5 MG tablet TAKE 4 TABLETS BY MOUTH ONCE DAILY FOR 2 WEEKS THEN DECREASE TO 3 TABS DAILY   No facility-administered encounter medications on file as of 07/12/2019.    Fall Risk  10/01/2018 07/16/2016  Falls in the  past year? 1 No  Comment Emmi Telephone Survey: data to providers prior to load Emmi Telephone Survey: data to providers prior to load  Number falls in past yr: 1 -  Comment Emmi Telephone Survey Actual Response = 3 -  Injury with Fall? 0 -   THN CM Care Plan Problem One     Most Recent Value  Care Plan Problem One  High risk for falls  Role Documenting the Problem One  Care Management [place] for Problem One  Active  THN Long Term Goal   Pt will not fall and sustain an injury requiring hospitalization over the next 90 days.  THN Long Term Goal Start Date  07/12/19  Interventions for Problem One Long Term Goal  Discussed , poor outcomes with serious falls, falls prevention, improving mobility with stregthening exercises and always being cautious and asking for help if she does not feel safe.  THN CM Short Term Goal #1   Pt to ask PT to assist her with problem solving for moving from bed to wheelchair without falling or getting stuck over the next 30 days.  THN CM Short Term Goal #1 Start Date  07/12/19  THN CM Short Term Goal #2   Pt will increase her exercise to daily and 15 minutes at a time to continue to improve her strength and stability over  the next month.Margie Billet CM Short Term Goal #2 Start Date  07/12/19  Interventions for Short Term Goal #2  Discussed importance of exercise in keeping herself mobile.    THN CM Care Plan Problem Two     Most Recent Value  Care Plan Problem Two  No Advanced directives.  Role Documenting the Problem Two  Care Management Coordinator  Care Plan for Problem Two  Active  THN CM Short Term Goal #1   Pt has initiated getting her documents completed: HCPOA, LW and estate will.  THN CM Short Term Goal #1 Start Date  07/12/19  Interventions for Short Term Goal #2   Encouraged to complete these important documents and ensure that her MD has a copy to add to her medical chart. Encouraged to complete during the month of September.     Pt has  agreed to care management. I will call her again in one week to complete the initial assessment: risk assessments, PHQ2.  Sending pt a welcome letter and [facility] information.  [name]. [name], MSN, [facility] Gerontological Nurse Practitioner [facility] Care Management [phone]

## 2019-07-13 ENCOUNTER — Encounter: Payer: Self-pay | Admitting: *Deleted

## 2019-07-13 DIAGNOSIS — M069 Rheumatoid arthritis, unspecified: Secondary | ICD-10-CM | POA: Diagnosis not present

## 2019-07-13 DIAGNOSIS — L245 Irritant contact dermatitis due to other chemical products: Secondary | ICD-10-CM | POA: Diagnosis not present

## 2019-07-13 DIAGNOSIS — I129 Hypertensive chronic kidney disease with stage 1 through stage 4 chronic kidney disease, or unspecified chronic kidney disease: Secondary | ICD-10-CM | POA: Diagnosis not present

## 2019-07-13 DIAGNOSIS — G629 Polyneuropathy, unspecified: Secondary | ICD-10-CM | POA: Diagnosis not present

## 2019-07-13 DIAGNOSIS — J45909 Unspecified asthma, uncomplicated: Secondary | ICD-10-CM | POA: Diagnosis not present

## 2019-07-13 DIAGNOSIS — N183 Chronic kidney disease, stage 3 (moderate): Secondary | ICD-10-CM | POA: Diagnosis not present

## 2019-07-13 DIAGNOSIS — F329 Major depressive disorder, single episode, unspecified: Secondary | ICD-10-CM | POA: Diagnosis not present

## 2019-07-15 DIAGNOSIS — M069 Rheumatoid arthritis, unspecified: Secondary | ICD-10-CM | POA: Diagnosis not present

## 2019-07-15 DIAGNOSIS — M1711 Unilateral primary osteoarthritis, right knee: Secondary | ICD-10-CM | POA: Diagnosis not present

## 2019-07-15 DIAGNOSIS — I129 Hypertensive chronic kidney disease with stage 1 through stage 4 chronic kidney disease, or unspecified chronic kidney disease: Secondary | ICD-10-CM | POA: Diagnosis not present

## 2019-07-15 DIAGNOSIS — F329 Major depressive disorder, single episode, unspecified: Secondary | ICD-10-CM | POA: Diagnosis not present

## 2019-07-15 DIAGNOSIS — M1712 Unilateral primary osteoarthritis, left knee: Secondary | ICD-10-CM | POA: Diagnosis not present

## 2019-07-15 DIAGNOSIS — G629 Polyneuropathy, unspecified: Secondary | ICD-10-CM | POA: Diagnosis not present

## 2019-07-15 DIAGNOSIS — J45909 Unspecified asthma, uncomplicated: Secondary | ICD-10-CM | POA: Diagnosis not present

## 2019-07-15 DIAGNOSIS — N183 Chronic kidney disease, stage 3 (moderate): Secondary | ICD-10-CM | POA: Diagnosis not present

## 2019-07-16 DIAGNOSIS — F329 Major depressive disorder, single episode, unspecified: Secondary | ICD-10-CM | POA: Diagnosis not present

## 2019-07-16 DIAGNOSIS — G629 Polyneuropathy, unspecified: Secondary | ICD-10-CM | POA: Diagnosis not present

## 2019-07-16 DIAGNOSIS — M069 Rheumatoid arthritis, unspecified: Secondary | ICD-10-CM | POA: Diagnosis not present

## 2019-07-16 DIAGNOSIS — J45909 Unspecified asthma, uncomplicated: Secondary | ICD-10-CM | POA: Diagnosis not present

## 2019-07-16 DIAGNOSIS — I129 Hypertensive chronic kidney disease with stage 1 through stage 4 chronic kidney disease, or unspecified chronic kidney disease: Secondary | ICD-10-CM | POA: Diagnosis not present

## 2019-07-16 DIAGNOSIS — N183 Chronic kidney disease, stage 3 (moderate): Secondary | ICD-10-CM | POA: Diagnosis not present

## 2019-07-20 ENCOUNTER — Other Ambulatory Visit: Payer: Self-pay

## 2019-07-20 ENCOUNTER — Other Ambulatory Visit: Payer: Self-pay | Admitting: *Deleted

## 2019-07-20 NOTE — Patient Outreach (Signed)
Telephone outreach. Unable to talk with Mrs. Bacigalupo, left a message and requested a return call.  Sherry Burke. [name], MSN, [facility] Gerontological Nurse Practitioner [facility] [place] Care Management [phone]

## 2019-07-21 ENCOUNTER — Ambulatory Visit: Payer: Self-pay | Admitting: *Deleted

## 2019-07-26 DIAGNOSIS — M6281 Muscle weakness (generalized): Secondary | ICD-10-CM | POA: Diagnosis not present

## 2019-07-26 DIAGNOSIS — R2689 Other abnormalities of gait and mobility: Secondary | ICD-10-CM | POA: Diagnosis not present

## 2019-07-26 DIAGNOSIS — M25561 Pain in right knee: Secondary | ICD-10-CM | POA: Diagnosis not present

## 2019-07-26 DIAGNOSIS — M25562 Pain in left knee: Secondary | ICD-10-CM | POA: Diagnosis not present

## 2019-07-27 ENCOUNTER — Encounter: Payer: Self-pay | Admitting: *Deleted

## 2019-07-27 ENCOUNTER — Other Ambulatory Visit: Payer: Self-pay | Admitting: *Deleted

## 2019-07-27 NOTE — Patient Outreach (Signed)
Telephone assessment. I was not able to speak with Sherry Burke last week. Today, she was at home and available to complete her initial assessment.  She is feeling better after having steroid injections in her knees. Now able to do the exercises with PT and transfer safely. She has not had any falls. She is exercising 60 minutes total per day!  She and her son will go to the attorney's office next week to get her Advanced Directives completed.  Very proud of this pt who is doing everything she can to remain active and at home!  I will call her in another week.  THN CM Care Plan Problem One     Most Recent Value  Care Plan Problem One  High risk for falls  Role Documenting the Problem One  Care Management [place] for Problem One  Active  THN Long Term Goal   Pt will not fall and sustain an injury requiring hospitalization over the next 90 days.  THN Long Term Goal Start Date  07/12/19  Interventions for Problem One Long Term Goal  Reinforced safety measures to prevent falls.  THN CM Short Term Goal #1   Pt to ask PT to assist her with problem solving for moving from bed to wheelchair without falling or getting stuck over the next 30 days.  THN CM Short Term Goal #1 Start Date  07/12/19  Interventions for Short Term Goal #1  Continue working with PT and participate 100% and keep doing exercises after PT discharges you from their service.  THN CM Short Term Goal #2   Pt will increase her exercise to daily and 15 minutes at a time to continue to improve her strength and stability over the next month..  THN CM Short Term Goal #2 Start Date  07/12/19  Interventions for Short Term Goal #2  Keep up the great work and continue after PT stops.    THN CM Care Plan Problem Two     Most Recent Value  Care Plan Problem Two  No Advanced directives.  Role Documenting the Problem Two  Care Management Coordinator  Care Plan for Problem Two  Active  THN CM Short Term Goal #1   Pt has  initiated getting her documents completed: HCPOA, LW and estate will.  THN CM Short Term Goal #1 Start Date  07/12/19  Interventions for Short Term Goal #2   Praised for making the steps to complete this important task.     Sherry Burke. Sherry Neither, MSN, [facility] Gerontological Nurse Practitioner [facility] Care Management [phone]

## 2019-07-28 DIAGNOSIS — M25562 Pain in left knee: Secondary | ICD-10-CM | POA: Diagnosis not present

## 2019-07-28 DIAGNOSIS — M6281 Muscle weakness (generalized): Secondary | ICD-10-CM | POA: Diagnosis not present

## 2019-07-28 DIAGNOSIS — R2689 Other abnormalities of gait and mobility: Secondary | ICD-10-CM | POA: Diagnosis not present

## 2019-07-28 DIAGNOSIS — M25561 Pain in right knee: Secondary | ICD-10-CM | POA: Diagnosis not present

## 2019-07-29 DIAGNOSIS — R7301 Impaired fasting glucose: Secondary | ICD-10-CM | POA: Diagnosis not present

## 2019-07-29 DIAGNOSIS — Z23 Encounter for immunization: Secondary | ICD-10-CM | POA: Diagnosis not present

## 2019-07-29 DIAGNOSIS — R0789 Other chest pain: Secondary | ICD-10-CM | POA: Diagnosis not present

## 2019-07-29 DIAGNOSIS — M0579 Rheumatoid arthritis with rheumatoid factor of multiple sites without organ or systems involvement: Secondary | ICD-10-CM | POA: Diagnosis not present

## 2019-07-29 DIAGNOSIS — G2581 Restless legs syndrome: Secondary | ICD-10-CM | POA: Diagnosis not present

## 2019-07-29 DIAGNOSIS — I10 Essential (primary) hypertension: Secondary | ICD-10-CM | POA: Diagnosis not present

## 2019-07-29 DIAGNOSIS — E782 Mixed hyperlipidemia: Secondary | ICD-10-CM | POA: Diagnosis not present

## 2019-07-29 DIAGNOSIS — E559 Vitamin D deficiency, unspecified: Secondary | ICD-10-CM | POA: Diagnosis not present

## 2019-07-29 DIAGNOSIS — I5032 Chronic diastolic (congestive) heart failure: Secondary | ICD-10-CM | POA: Diagnosis not present

## 2019-08-03 DIAGNOSIS — M6281 Muscle weakness (generalized): Secondary | ICD-10-CM | POA: Diagnosis not present

## 2019-08-03 DIAGNOSIS — M25561 Pain in right knee: Secondary | ICD-10-CM | POA: Diagnosis not present

## 2019-08-03 DIAGNOSIS — M25562 Pain in left knee: Secondary | ICD-10-CM | POA: Diagnosis not present

## 2019-08-03 DIAGNOSIS — R2689 Other abnormalities of gait and mobility: Secondary | ICD-10-CM | POA: Diagnosis not present

## 2019-08-04 ENCOUNTER — Other Ambulatory Visit: Payer: Self-pay

## 2019-08-04 ENCOUNTER — Other Ambulatory Visit: Payer: Self-pay | Admitting: *Deleted

## 2019-08-04 NOTE — Patient Outreach (Signed)
Telephone follow up.   Sherry Burke says she is doing well and making progress on her care plan goals!  She is exercising more. She is trying to do the PT provided exercises 2 times a day. Her challenge is standing up and sitting down. We discussed how important this is for her mobility.  She denies any falls.  She went to the attorney today and completed her Advanced Directives. Advised that she provide Dr. Tobie Poet a copy and the family member she named as her HCPOAs a copy and discuss with each so there is a good understanding of her wishes.  I will call her again next week.   [name]. [name], MSN, [facility] Gerontological Nurse Practitioner [facility] Care Management [phone]

## 2019-08-05 DIAGNOSIS — M25561 Pain in right knee: Secondary | ICD-10-CM | POA: Diagnosis not present

## 2019-08-05 DIAGNOSIS — M6281 Muscle weakness (generalized): Secondary | ICD-10-CM | POA: Diagnosis not present

## 2019-08-05 DIAGNOSIS — M25562 Pain in left knee: Secondary | ICD-10-CM | POA: Diagnosis not present

## 2019-08-05 DIAGNOSIS — R2689 Other abnormalities of gait and mobility: Secondary | ICD-10-CM | POA: Diagnosis not present

## 2019-08-10 DIAGNOSIS — M25561 Pain in right knee: Secondary | ICD-10-CM | POA: Diagnosis not present

## 2019-08-10 DIAGNOSIS — R2689 Other abnormalities of gait and mobility: Secondary | ICD-10-CM | POA: Diagnosis not present

## 2019-08-10 DIAGNOSIS — M25562 Pain in left knee: Secondary | ICD-10-CM | POA: Diagnosis not present

## 2019-08-10 DIAGNOSIS — M6281 Muscle weakness (generalized): Secondary | ICD-10-CM | POA: Diagnosis not present

## 2019-08-12 DIAGNOSIS — M25561 Pain in right knee: Secondary | ICD-10-CM | POA: Diagnosis not present

## 2019-08-12 DIAGNOSIS — M25562 Pain in left knee: Secondary | ICD-10-CM | POA: Diagnosis not present

## 2019-08-12 DIAGNOSIS — R2689 Other abnormalities of gait and mobility: Secondary | ICD-10-CM | POA: Diagnosis not present

## 2019-08-12 DIAGNOSIS — M6281 Muscle weakness (generalized): Secondary | ICD-10-CM | POA: Diagnosis not present

## 2019-08-12 NOTE — Patient Outreach (Signed)
Telelphone outreach. Mrs. Sherry Burke is tired today. She has had both OT and PT sessions. She continues to progress and reach goals. She has set a new goal to be able to go up and down the stairs at her children's homes. She has 3 children and all have stairs. She misses this very much and she wants to visit during the holiday season.  THN CM Care Plan Problem One     Most Recent Value  Care Plan Problem One  High risk for falls  Role Documenting the Problem One  Care Management [place] for Problem One  Active  THN Long Term Goal   Pt will not fall and sustain an injury requiring hospitalization over the next 90 days.  THN Long Term Goal Start Date  07/12/19  Interventions for Problem One Long Term Goal  Discussed pt new goal to start training on stairs with PT and looking forward to visiting children. Safety first always have stand by assist for stair climbing from now on!  THN CM Short Term Goal #1   Pt to ask PT to assist her with problem solving for moving from bed to wheelchair without falling or getting stuck over the next 30 days.  THN CM Short Term Goal #1 Start Date  07/12/19  THN CM Short Term Goal #1 Met Date  07/27/19  THN CM Short Term Goal #2   Pt will increase her exercise to daily and 15 minutes at a time to continue to improve her strength and stability over the next month.Margie Billet CM Short Term Goal #2 Start Date  07/12/19    [facility] CM Care Plan Problem Two     Most Recent Value  Care Plan Problem Two  No Advanced directives.  Role Documenting the Problem Two  Care Management Coordinator  Care Plan for Problem Two  Not Active  THN CM Short Term Goal #1   Pt has initiated getting her documents completed: HCPOA, LW and estate will.  THN CM Short Term Goal #1 Start Date  07/12/19  [facility] CM Short Term Goal #1 Met Date   08/04/19    [facility] CM Care Plan Problem Three     Most Recent Value  Care Plan Problem Three  Pt wants to climb stairs to get into childrens homes.  Role  Documenting the Problem Three  Care Management Coordinator  Care Plan for Problem Three  Active  THN CM Short Term Goal #1   Pt will work with PT to achieve her goal over the next 30 days.  THN CM Short Term Goal #1 Start Date  08/12/19  Interventions for Short Term Goal #1  Discussed importance of continued self motivated exercise and safety precautions. Praised pt for setting a new goal for herself.!     I will extend my next outreach to 2 weeks from now.  [name]. [name], MSN, [facility] Gerontological Nurse Practitioner [facility] Care Management [phone]

## 2019-08-13 ENCOUNTER — Other Ambulatory Visit: Payer: Self-pay | Admitting: *Deleted

## 2019-08-17 DIAGNOSIS — M25562 Pain in left knee: Secondary | ICD-10-CM | POA: Diagnosis not present

## 2019-08-17 DIAGNOSIS — R2689 Other abnormalities of gait and mobility: Secondary | ICD-10-CM | POA: Diagnosis not present

## 2019-08-17 DIAGNOSIS — M6281 Muscle weakness (generalized): Secondary | ICD-10-CM | POA: Diagnosis not present

## 2019-08-17 DIAGNOSIS — M25561 Pain in right knee: Secondary | ICD-10-CM | POA: Diagnosis not present

## 2019-08-19 DIAGNOSIS — M6281 Muscle weakness (generalized): Secondary | ICD-10-CM | POA: Diagnosis not present

## 2019-08-19 DIAGNOSIS — R2689 Other abnormalities of gait and mobility: Secondary | ICD-10-CM | POA: Diagnosis not present

## 2019-08-19 DIAGNOSIS — M25561 Pain in right knee: Secondary | ICD-10-CM | POA: Diagnosis not present

## 2019-08-19 DIAGNOSIS — M25562 Pain in left knee: Secondary | ICD-10-CM | POA: Diagnosis not present

## 2019-08-19 DIAGNOSIS — N3000 Acute cystitis without hematuria: Secondary | ICD-10-CM | POA: Diagnosis not present

## 2019-08-25 ENCOUNTER — Other Ambulatory Visit: Payer: Self-pay | Admitting: *Deleted

## 2019-08-25 ENCOUNTER — Other Ambulatory Visit: Payer: Self-pay

## 2019-08-25 NOTE — Patient Outreach (Signed)
Telephone assessment. Pt reports having had sxs of UTI, frequency. She took a specimen to her provider who verified the infection and put her on Macrobid. She is drinking plenty of water, paying attention to her perineal hygiene, wiping front to back.   She has not participated in PT this week.  She has completed her Advanced Directives.  Encouraged her to take care of herself and continue her regimen.  [name]. [name], MSN, [facility] Gerontological Nurse Practitioner [facility] Care Management [phone]

## 2019-08-31 DIAGNOSIS — M25561 Pain in right knee: Secondary | ICD-10-CM | POA: Diagnosis not present

## 2019-08-31 DIAGNOSIS — R2689 Other abnormalities of gait and mobility: Secondary | ICD-10-CM | POA: Diagnosis not present

## 2019-08-31 DIAGNOSIS — M6281 Muscle weakness (generalized): Secondary | ICD-10-CM | POA: Diagnosis not present

## 2019-08-31 DIAGNOSIS — M25562 Pain in left knee: Secondary | ICD-10-CM | POA: Diagnosis not present

## 2019-09-01 DIAGNOSIS — Z79899 Other long term (current) drug therapy: Secondary | ICD-10-CM | POA: Diagnosis not present

## 2019-09-01 DIAGNOSIS — M2550 Pain in unspecified joint: Secondary | ICD-10-CM | POA: Diagnosis not present

## 2019-09-01 DIAGNOSIS — M0579 Rheumatoid arthritis with rheumatoid factor of multiple sites without organ or systems involvement: Secondary | ICD-10-CM | POA: Diagnosis not present

## 2019-09-01 DIAGNOSIS — Z13220 Encounter for screening for lipoid disorders: Secondary | ICD-10-CM | POA: Diagnosis not present

## 2019-09-01 DIAGNOSIS — M150 Primary generalized (osteo)arthritis: Secondary | ICD-10-CM | POA: Diagnosis not present

## 2019-09-01 DIAGNOSIS — M48061 Spinal stenosis, lumbar region without neurogenic claudication: Secondary | ICD-10-CM | POA: Diagnosis not present

## 2019-09-01 NOTE — Progress Notes (Signed)
Synopsis: Referred in October 2020 for abnormal imaging by [name], MD.  Previously patient of Dr. Gwenette Greet for OSA, COPD.  Subjective:   PATIENT ID: Sherry Burke GENDER: female DOB: June 26, 1941, MRN: [id number]  Chief Complaint  Patient presents with  . Consult    lung scarring     Sherry Burke is a 78 year old woman with a history of difficult to control rheumatoid arthritis and COPD who presents at the request of her rheumatologist to evaluate if she is an appropriate candidate for methotrexate.  She is accompanied with her son.  She was on methotrexate for 8 years in the past, which was stopped about 19 years ago.  At that time she presented to the ED with a cough shortness of breath fever, feeling sick, was told in the ED that she had lung scarring from methotrexate.  Recently she has tried multiple other medications which have been ineffective at treating her RA.  She is about to start a new medication, but her Rheumatologist Dr. Lenna Gilford would like to know if MTX is an option to use again. She has been seen in the past for breathing difficulties attributed to asthma or another unknown process that has led to fixed obstructive lung disease.  Her breathing has not caused her difficulty recently.  She denies shortness of breath, cough, and wheezing.  She has not used an inhaler in a long time, but she has albuterol available if needed.  She has chronic GERD which is well controlled with omeprazole.  She has never smoked or vaped.  Her recent past medical history is notable for urinary tract infections, which she gets about 1 year.  In July 2020 she was hospitalized for a week at [facility] for severe UTI.  She was discharged to rehab, and has since returned home.  Her family thinks that she developed a UTI from her immobility from her rheumatoid arthritis-especially related to bilateral knee pain.  She was prescribed nitrofurantoin a few weeks ago for urinary tract infection, but in the past has  taken other antibiotics.    Past Medical History:  Diagnosis Date  . Acute renal insufficiency   . Asthma   . Chronic bronchitis   . COPD (chronic obstructive pulmonary disease) ([place])   . Depression   . History of migraine headaches   . Hyperlipemia   . OSA (obstructive sleep apnea)    pt states she doesn't have it  . Osteoarthritis   . Pneumonia   . Rheumatoid arthritis(714.0)   . RLS (restless legs syndrome)      Family History  Problem Relation Age of Onset  . Lung cancer Father   . Stroke Mother   . Hypertension Mother   . Diabetes Mother   . Rheum arthritis Sister   . Rheum arthritis Sister      Past Surgical History:  Procedure Laterality Date  . KNEE SURGERY Bilateral    when younger  . TOTAL ABDOMINAL HYSTERECTOMY  06/1988    Social History   Socioeconomic History  . Marital status: Married    Spouse name: Durene Fruits  . Number of children: 3  . Years of education: Not on file  . Highest education level: Not on file  Occupational History  . Occupation: retired    Comment: homemaker  Social Needs  . Financial resource strain: Not hard at all  . Food insecurity    Worry: Never true    Inability: Never true  . Transportation needs    Medical: No  Non-medical: No  Tobacco Use  . Smoking status: Passive Smoke Exposure - Never Smoker  . Smokeless tobacco: Never Used  Substance and Sexual Activity  . Alcohol use: No    Frequency: Never  . Drug use: No  . Sexual activity: Not Currently    Birth control/protection: None    Comment: Married  Lifestyle  . Physical activity    Days per week: 7 days    Minutes per session: 60 min  . Stress: Only a little  Relationships  . Social connections    Talks on phone: More than three times a week    Gets together: Once a week    Attends religious service: More than 4 times per year    Active member of club or organization: Yes    Attends meetings of clubs or organizations: 1 to 4 times per year     Relationship status: Married  . Intimate partner violence    Fear of current or ex partner: No    Emotionally abused: No    Physically abused: No    Forced sexual activity: No  Other Topics Concern  . Not on file  Social History Narrative   Lives with her husband, who is very helpful and supportive.     Allergies  Allergen Reactions  . Remicade [Infliximab] Nausea And Vomiting and Other (See Comments)    Affects nervous system     Immunization History  Administered Date(s) Administered  . Influenza Whole 08/12/2011  . Influenza, High Dose Seasonal PF 08/12/2019    Outpatient Medications Prior to Visit  Medication Sig Dispense Refill  . albuterol (VENTOLIN HFA) 108 (90 BASE) MCG/ACT inhaler Inhale 1-2 puffs into the lungs every 4 (four) hours as needed for shortness of breath.     Marland Kitchen amLODipine (NORVASC) 5 MG tablet Take 5 mg by mouth daily.      Marland Kitchen atorvastatin (LIPITOR) 40 MG tablet     . Cholecalciferol (VITAMIN D3) 2000 UNITS TABS Take 1 tablet by mouth daily.    . cloNIDine (CATAPRES) 0.1 MG tablet     . diphenhydramine-acetaminophen (TYLENOL PM) 25-500 MG TABS tablet Take 1 tablet by mouth at bedtime as needed.    . gabapentin (NEURONTIN) 300 MG capsule     . HYDROcodone-acetaminophen (NORCO/VICODIN) 5-325 MG tablet TAKE 1 TABLET BY MOUTH EVERY 12 HOURS AS NEEDED FOR ARTHRITIC PAIN    . hydroxychloroquine (PLAQUENIL) 200 MG tablet Take 200 mg by mouth 2 (two) times daily.    Marland Kitchen lisinopril (PRINIVIL,ZESTRIL) 10 MG tablet     . omeprazole (PRILOSEC) 40 MG capsule     . PARoxetine (PAXIL) 20 MG tablet Take 20 mg by mouth at bedtime.     Marland Kitchen rOPINIRole (REQUIP) 1 MG tablet 2 every morning and 3 at bedtime    . spironolactone (ALDACTONE) 25 MG tablet     . nabumetone (RELAFEN) 750 MG tablet Take 750 mg by mouth 2 (two) times daily.      Marland Kitchen zolpidem (AMBIEN CR) 12.5 MG CR tablet Take 12.5 mg by mouth at bedtime.       No facility-administered medications prior to visit.      Review of Systems  Constitutional: Negative for chills, diaphoresis, fever, malaise/fatigue and weight loss.       Just recovering from UTI  HENT: Negative for congestion, ear pain and sore throat.   Respiratory: Negative for cough, hemoptysis, sputum production, shortness of breath and wheezing.   Cardiovascular: Positive for leg swelling. Negative  for chest pain and palpitations.  Gastrointestinal: Negative for abdominal pain, blood in stool, heartburn and nausea.  Genitourinary: Positive for frequency. Negative for hematuria.  Musculoskeletal: Positive for joint pain and myalgias.       Due to the RA  Skin: Negative for itching and rash.  Neurological: Positive for weakness. Negative for dizziness and headaches.       Global weakness  Endo/Heme/Allergies: Negative for environmental allergies. Does not bruise/bleed easily.  Psychiatric/Behavioral: Negative for depression. The patient is nervous/anxious.      Objective:   Vitals:   09/02/19 0937  BP: (!) 148/56  Pulse: 86  Temp: 97.6 F (36.4 C)  TempSrc: Temporal  SpO2: 94%  Weight: 175 lb 9.6 oz (79.7 kg)  Height: 5' (1.524 m)   94% on  RA BMI Readings from Last 3 Encounters:  09/02/19 34.29 kg/m  01/29/19 32.69 kg/m  08/23/13 32.26 kg/m   Wt Readings from Last 3 Encounters:  09/02/19 175 lb 9.6 oz (79.7 kg)  01/29/19 173 lb (78.5 kg)  08/23/13 176 lb 6.4 oz (80 kg)    Physical Exam Vitals signs reviewed.  Constitutional:      Appearance: Normal appearance. She is obese. She is not ill-appearing.  HENT:     Head: Normocephalic and atraumatic.     Nose:     Comments: Deferred due to masking requirement.    Mouth/Throat:     Comments: Deferred due to masking requirement. Eyes:     General: No scleral icterus. Neck:     Musculoskeletal: Neck supple.  Cardiovascular:     Rate and Rhythm: Normal rate and regular rhythm.     Heart sounds: No murmur.  Pulmonary:     Comments: Breathing comfortably on room  air, no tachypnea or conversational dyspnea.  Bibasilar rhales R>L not completely resolved with deep inspiration, no wheezing. Abdominal:     General: There is no distension.     Palpations: Abdomen is soft.     Tenderness: There is no abdominal tenderness.  Musculoskeletal:     Comments: No ulnar deviation, prominent MCP joints, but no active synotivis or erythema. Left ankle synovities and knee effusions bilaterally.  Lymphadenopathy:     Cervical: No cervical adenopathy.  Skin:    General: Skin is warm and dry.     Comments: Annular raised, crusted erythematous rash on left leg, face.  Neurological:     General: No focal deficit present.     Mental Status: She is alert.     Coordination: Coordination normal.     Comments: Sitting in wheelchair.  Psychiatric:        Mood and Affect: Mood normal.        Behavior: Behavior normal.      CBC    Component Value Date/Time   WBC 7.6 08/23/2013 1829   RBC 4.50 08/23/2013 1829   HGB 12.9 08/23/2013 1829   HCT 39.5 08/23/2013 1829   PLT 161 08/23/2013 1829   MCV 87.8 08/23/2013 1829   MCH 28.7 08/23/2013 1829   MCHC 32.7 08/23/2013 1829   RDW 14.2 08/23/2013 1829   LYMPHSABS 1.5 08/23/2013 1829   MONOABS 0.6 08/23/2013 1829   EOSABS 0.3 08/23/2013 1829   BASOSABS 0.1 08/23/2013 1829     Chest Imaging- films reviewed: CXR, 2- view 03/18/2011: Low lung volumes, R>L small granulomas. No masses or opacities.  X-ray C-spine, flexion and extension-report reviewed: Degenerative changes, most prominent in the lower cervical spine.  Slight anterolisthesis of C4  on C5.  Normal C1-C2 joint.  Pulmonary Functions Testing Results: No flowsheet data found.  04/10/2011: FVC 2.64 (92%)--> 2.63 (92%, 0% change) FEV1 1.55 L (76%) --> 1.57 L (77%, 1% change) Ratio 59 RV 1.8 (94%)  TLC 4.44 (92%) via nitrogen washout DLCO 20.3 (83%)  flow volume loop consistent with obstruction -Mild obstruction, no significant restriction or diffusion  impairment.      Assessment & Plan:     ICD-10-CM   1. Chronic obstructive pulmonary disease, unspecified COPD type ([place])  J44.9 Pulmonary function test    CT Chest High Resolution  2. Immunosuppressed status ([place])  D84.9 Pulmonary function test    CT Chest High Resolution  3. Rheumatoid arthritis involving multiple sites with positive rheumatoid factor (H[facility])  M05.79 Pulmonary function test    CT Chest High Resolution    Mildly immunosuppressed due to RA treatment; uncontrolled synovitis.  Concern for previous lung toxicity from methotrexate.  Of note, she is also taking nitrofurantoin, which can cause lung toxicity.  Rheumatoid arthritis itself can manifest with lung disease.  It is not clear that when her cetraxate was stopped previously that this was not related to an acute infection. -high-resolution CT with prone images and PFTs to evaluate for fibrotic lung disease related to previous methotrexate use -If she were to start methotrexate in the future, she would need very frequent surveillance with PFTs to evaluate for subtle changes suggestive of lung toxicity. -Recommend against any future nitrofurantoin use. -Up-to-date on seasonal flu and pneumococcal vaccinations.  COPD- asthma vs chronic bronchiolitis related to RA.  No previous tobacco use. -Stable with as needed bronchodilators  RTC in 4 to 6 weeks after CT scan and PFTs have been performed.   Current Outpatient Medications:  .  albuterol (VENTOLIN HFA) 108 (90 BASE) MCG/ACT inhaler, Inhale 1-2 puffs into the lungs every 4 (four) hours as needed for shortness of breath. , Disp: , Rfl:  .  amLODipine (NORVASC) 5 MG tablet, Take 5 mg by mouth daily.  , Disp: , Rfl:  .  atorvastatin (LIPITOR) 40 MG tablet, , Disp: , Rfl:  .  Cholecalciferol (VITAMIN D3) 2000 UNITS TABS, Take 1 tablet by mouth daily., Disp: , Rfl:  .  cloNIDine (CATAPRES) 0.1 MG tablet, , Disp: , Rfl:  .  diphenhydramine-acetaminophen (TYLENOL PM) 25-500 MG  TABS tablet, Take 1 tablet by mouth at bedtime as needed., Disp: , Rfl:  .  gabapentin (NEURONTIN) 300 MG capsule, , Disp: , Rfl:  .  HYDROcodone-acetaminophen (NORCO/VICODIN) 5-325 MG tablet, TAKE 1 TABLET BY MOUTH EVERY 12 HOURS AS NEEDED FOR ARTHRITIC PAIN, Disp: , Rfl:  .  hydroxychloroquine (PLAQUENIL) 200 MG tablet, Take 200 mg by mouth 2 (two) times daily., Disp: , Rfl:  .  lisinopril (PRINIVIL,ZESTRIL) 10 MG tablet, , Disp: , Rfl:  .  omeprazole (PRILOSEC) 40 MG capsule, , Disp: , Rfl:  .  PARoxetine (PAXIL) 20 MG tablet, Take 20 mg by mouth at bedtime. , Disp: , Rfl:  .  rOPINIRole (REQUIP) 1 MG tablet, 2 every morning and 3 at bedtime, Disp: , Rfl:  .  spironolactone (ALDACTONE) 25 MG tablet, , Disp: , Rfl:  .  nabumetone (RELAFEN) 750 MG tablet, Take 750 mg by mouth 2 (two) times daily.  , Disp: , Rfl:    Julian Hy, DO [facility] 09/02/2019 10:54 AM

## 2019-09-02 ENCOUNTER — Ambulatory Visit (INDEPENDENT_AMBULATORY_CARE_PROVIDER_SITE_OTHER): Payer: Medicare Other | Admitting: Critical Care Medicine

## 2019-09-02 ENCOUNTER — Encounter: Payer: Self-pay | Admitting: Critical Care Medicine

## 2019-09-02 ENCOUNTER — Other Ambulatory Visit: Payer: Self-pay

## 2019-09-02 VITALS — BP 148/56 | HR 86 | Temp 97.6°F | Ht 60.0 in | Wt 175.6 lb

## 2019-09-02 DIAGNOSIS — D849 Immunodeficiency, unspecified: Secondary | ICD-10-CM | POA: Diagnosis not present

## 2019-09-02 DIAGNOSIS — J449 Chronic obstructive pulmonary disease, unspecified: Secondary | ICD-10-CM

## 2019-09-02 DIAGNOSIS — M0579 Rheumatoid arthritis with rheumatoid factor of multiple sites without organ or systems involvement: Secondary | ICD-10-CM

## 2019-09-02 NOTE — Patient Instructions (Addendum)
Thank you for visiting Dr. Carlis Abbott at [facility]. We recommend the following: Orders Placed This Encounter  Procedures  . CT Chest High Resolution  . Pulmonary function test   Orders Placed This Encounter  Procedures  . CT Chest High Resolution    Prone images needed to evaluate lung bases- has crackles on exam.  Please schedule within 1 month.    Standing Status:   Future    Standing Expiration Date:   11/01/2020    Order Specific Question:   ** REASON FOR EXAM (FREE TEXT)    Answer:   evaluation for ILD- RA, previous MTX use. Crackles in bases, R>L    Order Specific Question:   Preferred imaging location?    Answer:   [facility]    Order Specific Question:   Radiology Contrast Protocol - do NOT remove file path    Answer:   [email or web address]  . Pulmonary function test    Standing Status:   Future    Standing Expiration Date:   09/01/2020    Scheduling Instructions:     Within 1 month please    Order Specific Question:   Where should this test be performed?    Answer:   [facility]    Order Specific Question:   Full PFT: includes the following: basic spirometry, spirometry pre & post bronchodilator, diffusion capacity (DLCO), lung volumes    Answer:   Full PFT      Return in about 4 weeks (around 09/30/2019).    Please do your part to reduce the spread of COVID-19.

## 2019-09-06 DIAGNOSIS — M25562 Pain in left knee: Secondary | ICD-10-CM | POA: Diagnosis not present

## 2019-09-06 DIAGNOSIS — M6281 Muscle weakness (generalized): Secondary | ICD-10-CM | POA: Diagnosis not present

## 2019-09-06 DIAGNOSIS — R2689 Other abnormalities of gait and mobility: Secondary | ICD-10-CM | POA: Diagnosis not present

## 2019-09-06 DIAGNOSIS — M25561 Pain in right knee: Secondary | ICD-10-CM | POA: Diagnosis not present

## 2019-09-07 DIAGNOSIS — G5603 Carpal tunnel syndrome, bilateral upper limbs: Secondary | ICD-10-CM | POA: Diagnosis not present

## 2019-09-07 DIAGNOSIS — M1711 Unilateral primary osteoarthritis, right knee: Secondary | ICD-10-CM | POA: Diagnosis not present

## 2019-09-07 DIAGNOSIS — M1712 Unilateral primary osteoarthritis, left knee: Secondary | ICD-10-CM | POA: Diagnosis not present

## 2019-09-08 ENCOUNTER — Ambulatory Visit (HOSPITAL_COMMUNITY)
Admission: RE | Admit: 2019-09-08 | Discharge: 2019-09-08 | Disposition: A | Discharge status: Home / Self Care | Payer: Medicare Other | Source: Ambulatory Visit | Attending: Critical Care Medicine | Admitting: Critical Care Medicine

## 2019-09-08 ENCOUNTER — Other Ambulatory Visit: Payer: Self-pay

## 2019-09-08 DIAGNOSIS — D849 Immunodeficiency, unspecified: Secondary | ICD-10-CM

## 2019-09-08 DIAGNOSIS — J449 Chronic obstructive pulmonary disease, unspecified: Secondary | ICD-10-CM | POA: Insufficient documentation

## 2019-09-08 DIAGNOSIS — M0579 Rheumatoid arthritis with rheumatoid factor of multiple sites without organ or systems involvement: Secondary | ICD-10-CM | POA: Diagnosis not present

## 2019-09-08 DIAGNOSIS — R918 Other nonspecific abnormal finding of lung field: Secondary | ICD-10-CM | POA: Diagnosis not present

## 2019-09-09 DIAGNOSIS — M1712 Unilateral primary osteoarthritis, left knee: Secondary | ICD-10-CM | POA: Diagnosis not present

## 2019-09-09 NOTE — Progress Notes (Signed)
Pt notified. Nothing further needed

## 2019-09-10 ENCOUNTER — Other Ambulatory Visit: Payer: Self-pay | Admitting: *Deleted

## 2019-09-10 NOTE — Patient Outreach (Signed)
Telephone outreach attempt #1 unsuccessful and unable to leave a voice mail. Will call again later today.  [name]. [name], MSN, [facility] Gerontological Nurse Practitioner [facility] Care Management [phone]

## 2019-09-14 ENCOUNTER — Other Ambulatory Visit: Payer: Self-pay | Admitting: *Deleted

## 2019-09-14 DIAGNOSIS — M6281 Muscle weakness (generalized): Secondary | ICD-10-CM | POA: Diagnosis not present

## 2019-09-14 DIAGNOSIS — M25562 Pain in left knee: Secondary | ICD-10-CM | POA: Diagnosis not present

## 2019-09-14 DIAGNOSIS — M25561 Pain in right knee: Secondary | ICD-10-CM | POA: Diagnosis not present

## 2019-09-14 DIAGNOSIS — R2689 Other abnormalities of gait and mobility: Secondary | ICD-10-CM | POA: Diagnosis not present

## 2019-09-16 DIAGNOSIS — M1712 Unilateral primary osteoarthritis, left knee: Secondary | ICD-10-CM | POA: Diagnosis not present

## 2019-09-21 ENCOUNTER — Ambulatory Visit (INDEPENDENT_AMBULATORY_CARE_PROVIDER_SITE_OTHER): Payer: Medicare Other | Admitting: Internal Medicine

## 2019-09-21 ENCOUNTER — Other Ambulatory Visit: Payer: Self-pay | Admitting: *Deleted

## 2019-09-21 ENCOUNTER — Encounter: Payer: Self-pay | Admitting: Critical Care Medicine

## 2019-09-21 DIAGNOSIS — M6281 Muscle weakness (generalized): Secondary | ICD-10-CM | POA: Diagnosis not present

## 2019-09-21 DIAGNOSIS — M25561 Pain in right knee: Secondary | ICD-10-CM | POA: Diagnosis not present

## 2019-09-21 DIAGNOSIS — R2689 Other abnormalities of gait and mobility: Secondary | ICD-10-CM | POA: Diagnosis not present

## 2019-09-21 DIAGNOSIS — J449 Chronic obstructive pulmonary disease, unspecified: Secondary | ICD-10-CM

## 2019-09-21 DIAGNOSIS — M25562 Pain in left knee: Secondary | ICD-10-CM | POA: Diagnosis not present

## 2019-09-21 NOTE — Progress Notes (Signed)
We discussed Sherry Burke's case at ILD conference this morning. 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 Likely this will continue to progress if she remains off DMARDs. MTX would be helpful with preventing progression of ILD and arthritis. She could be considered for antifibrotic medications depending on her ability to tolerate these medications and potential side effects. I will discuss possible antifibrotics and the prognosis of RA-ILD with her in follow up.  I have left a message for Dr. Lenna Gilford at [facility] to discuss with her.  Julian Hy, DO 09/21/19 4:14 PM [facility]

## 2019-09-21 NOTE — Patient Outreach (Signed)
Telephone outreach and case closure.  Mrs. Sherry Burke is so happy to report she is really doing well! Her mobility has improved dramatically since our last conversation. She is able to get up and walk with a cane. She can take some steps also with stand by supervision.  [facility] CM Care Plan Problem One     Most Recent Value  Care Plan Problem One  High risk for falls  Role Documenting the Problem One  Care Management [place] for Problem One  Active  THN Long Term Goal   Pt will not fall and sustain an injury requiring hospitalization over the next 90 days.  [facility]N Long Term Goal Start Date  07/12/19  [facility]N Long Term Goal Met Date  09/21/19  Interventions for Problem One Long Term Goal  Continue to always be aware of your surroundings and be cautious with safety precautions in mind.  [facility] CM Short Term Goal #1   Pt to ask PT to assist her with problem solving for moving from bed to wheelchair without falling or getting stuck over the next 30 days.  [facility]N CM Short Term Goal #1 Start Date  07/12/19  [facility] CM Short Term Goal #1 Met Date  07/27/19  [facility]N CM Short Term Goal #2   Pt will increase her exercise to daily and 15 minutes at a time to continue to improve her strength and stability over the next month..  THN CM Short Term Goal #2 Start Date  07/12/19  [facility] CM Short Term Goal #2 Met Date  09/21/19  Interventions for Short Term Goal #2  Continue to exercise and keep up your strength and stamina    [facility] CM Care Plan Problem Two     Most Recent Value  Care Plan Problem Two  No Advanced directives.  Role Documenting the Problem Two  Care Management Coordinator  Care Plan for Problem Two  Not Active  THN CM Short Term Goal #1   Pt has initiated getting her documents completed: HCPOA, LW and estate will.  THN CM Short Term Goal #1 Start Date  07/12/19  [facility] CM Short Term Goal #1 Met Date   08/04/19    [facility] CM Care Plan Problem Three     Most Recent Value  Care Plan Problem Three  Pt wants to  climb stairs to get into childrens homes.  Role Documenting the Problem Three  Care Management Coordinator  Care Plan for Problem Three  Active  THN CM Short Term Goal #1   Pt will work with PT to achieve her goal over the next 30 days.  [facility]N CM Short Term Goal #1 Start Date  08/12/19  [facility] CM Short Term Goal #1 Met Date  09/21/19  Interventions for Short Term Goal #1  always be safe. Ask for help if in doubt and don't take any unnecessary risks.     I am closing her case today, as she has met all of her care plan goals. Informed she may call me at any time in the future if she has any needs or questions.  [name]. [name], MSN, [facility] Gerontological Nurse Practitioner [facility] Care Management [phone]

## 2019-09-22 NOTE — Progress Notes (Signed)
Interstitial Lung Disease Multidisciplinary Conference   Sherry Burke    MRN [id number]    DOB [birth year]  Primary Care Physician:Cox, Elnita Maxwell, MD  Referring Physician: Dr Ander Purpura Ernest Mallick  Time of Conference: 7.30am- 8.30am Date of conference: 09/21/2019 Location of Conference: -  Virtual  Participating Pulmonary: Dr. Brand Males, MD - yes,  Dr Marshell Garfinkel, MD - yes Pathology: Dr Jaquita Folds, MD - no , Dr Enid Cutter - yes Radiology: Dr Salvatore Marvel MD - no, Dr Vinnie Langton MD - no,  Dr Lorin Picket, MD - no , Dr Eddie Candle - YES Others: Dr Carlis Abbott  Brief History: H/o RA with ongoing synovitis, difficulty tolerating multiple DMARDs, had an infection on biologics. Had taken Methotrexate in the past, but stopped because someone told her she had MTX lung toxicity, but this was an ER doctor and it was so far back that I can't look at everything myself to verify that this wasn't just a pulmonary infection, which is what her presentation sounded like. I don't have any old PFTs to compare. The patient and Rheum want to rechallenge with MTX. The question is does this look more like MTX-induced lung toxicity or RA-ILD and bronchiectasis 2/2 RA?  Serology: RA +  MDD discussion of CT scan    - Date or time period of scan: 09/08/2019  - Features mentioned:  -   Zonal predominance -no craniocaudal gradient  - subpleural findings -did not discuss - Traction Bronchiectasis -yes - Reticulation -yes - Honeycombing -no - Air trapping -no - Ground Glass Opacities -did not discuss - Emphysema -no - Miscellaneous Comments -extreme mild nodularity only  - What is the final conclusion per 2018 ATS/Fleischner Criteria -  -indeterminate for UIP.  Progressive compared to 2016.  CT is not consistent with methotrexate toxicity  Pathology discussion of biopsy *no pathology  PFTs: Last PFT in 2012  Labs: No serology but creatinine 0.77 mg in 2014  MDD Impression/Recs:   Has  progressive ILD in the setting of rheumatoid arthritis.  Step 1 therapy involves immunomodulator which could range from methotrexate to Imuran to CellCept.  CellCept does not handle the symptoms of rheumatoid arthritis but is potentially based on some retrospective studies the best immunomodulatory option in the case of connective tissue disease ILD.  Add on prednisone therapy could be considered.  If disease progresses then nintedanib add-on would be an option as an antifibrotic.  Given the connective tissue disease diagnosis biopsy not required unless an alternate diagnosis beyond connective tissue diseases considered  Recommend getting pulmonary function test   Time Spent in preparation and discussion:  > 30 min    SIGNATURE    Dr. Brand Males, M.D., F.C.C.P,  Pulmonary and Critical Care Medicine Staff Physician, [place] Director - Interstitial Lung Disease  Program  Pulmonary [place] [facility] [place], [place], [address]  Pager: [phone], If no answer or between  15:00h - 7:00h: call [phone] Telephone: [phone]  4:24 PM 09/22/2019 ...................................................................................................................Marland Kitchen  References: Diagnosis of Idiopathic Pulmonary Fibrosis. An Official ATS/ERS/JRS/ALAT Clinical Practice Guideline. Raghu G et al, [place]. 2018 Sep 1;198(5):e44-e68.   IPF Suspected   Histopath ology Pattern      UIP  Probable UIP  Indeterminate for  UIP  Alternative  diagnosis    UIP  IPF  IPF  IPF  Non-IPF dx   HRCT   Probabe UIP  IPF  IPF  IPF (Likely)**  Non-IPF dx  Pattern  Indeterminate for UIP  IPF  IPF (Likely)**  Indeterminate  for IPF**  Non-IPF dx    Alternative diagnosis  IPF (Likely)**/ non-IPF dx  Non-IPF dx  Non-IPF dx  Non-IPF dx     Idiopathic pulmonary fibrosis diagnosis based upon HRCT and  Biopsy paterns.  ** IPF is the likely diagnosis when any of following features are present:  . Moderate-to-severe traction bronchiectasis/bronchiolectasis (defined as mild traction bronchiectasis/bronchiolectasis in four or more lobes including the lingual as a lobe, or moderate to severe traction bronchiectasis in two or more lobes) in a man over age 33 years or in a woman over age 69 years . Extensive (>30%) reticulation on HRCT and an age >70 years  . Increased neutrophils and/or absence of lymphocytosis in BAL fluid  . Multidisciplinary discussion reaches a confident diagnosis of IPF.   **Indeterminate for IPF  . Without an adequate biopsy is unlikely to be IPF  . With an adequate biopsy may be reclassified to a more specific diagnosis after multidisciplinary discussion and/or additional consultation.   dx = diagnosis; HRCT = high-resolution computed tomography; IPF = idiopathic pulmonary fibrosis; UIP = usual interstitial pneumonia.

## 2019-09-23 DIAGNOSIS — M1712 Unilateral primary osteoarthritis, left knee: Secondary | ICD-10-CM | POA: Diagnosis not present

## 2019-09-30 DIAGNOSIS — J849 Interstitial pulmonary disease, unspecified: Secondary | ICD-10-CM | POA: Diagnosis not present

## 2019-09-30 DIAGNOSIS — M150 Primary generalized (osteo)arthritis: Secondary | ICD-10-CM | POA: Diagnosis not present

## 2019-09-30 DIAGNOSIS — M0579 Rheumatoid arthritis with rheumatoid factor of multiple sites without organ or systems involvement: Secondary | ICD-10-CM | POA: Diagnosis not present

## 2019-09-30 DIAGNOSIS — Z13220 Encounter for screening for lipoid disorders: Secondary | ICD-10-CM | POA: Diagnosis not present

## 2019-09-30 DIAGNOSIS — M1711 Unilateral primary osteoarthritis, right knee: Secondary | ICD-10-CM | POA: Diagnosis not present

## 2019-09-30 DIAGNOSIS — Z79899 Other long term (current) drug therapy: Secondary | ICD-10-CM | POA: Diagnosis not present

## 2019-09-30 DIAGNOSIS — M48061 Spinal stenosis, lumbar region without neurogenic claudication: Secondary | ICD-10-CM | POA: Diagnosis not present

## 2019-10-04 ENCOUNTER — Ambulatory Visit: Payer: Medicare Other | Admitting: Critical Care Medicine

## 2019-10-05 DIAGNOSIS — M25562 Pain in left knee: Secondary | ICD-10-CM | POA: Diagnosis not present

## 2019-10-05 DIAGNOSIS — M6281 Muscle weakness (generalized): Secondary | ICD-10-CM | POA: Diagnosis not present

## 2019-10-05 DIAGNOSIS — M25561 Pain in right knee: Secondary | ICD-10-CM | POA: Diagnosis not present

## 2019-10-05 DIAGNOSIS — R2689 Other abnormalities of gait and mobility: Secondary | ICD-10-CM | POA: Diagnosis not present

## 2019-10-11 DIAGNOSIS — M1711 Unilateral primary osteoarthritis, right knee: Secondary | ICD-10-CM | POA: Diagnosis not present

## 2019-10-12 DIAGNOSIS — R2689 Other abnormalities of gait and mobility: Secondary | ICD-10-CM | POA: Diagnosis not present

## 2019-10-12 DIAGNOSIS — M6281 Muscle weakness (generalized): Secondary | ICD-10-CM | POA: Diagnosis not present

## 2019-10-12 DIAGNOSIS — M25561 Pain in right knee: Secondary | ICD-10-CM | POA: Diagnosis not present

## 2019-10-12 DIAGNOSIS — M25562 Pain in left knee: Secondary | ICD-10-CM | POA: Diagnosis not present

## 2019-10-18 DIAGNOSIS — M1711 Unilateral primary osteoarthritis, right knee: Secondary | ICD-10-CM | POA: Diagnosis not present

## 2019-10-18 DIAGNOSIS — N3000 Acute cystitis without hematuria: Secondary | ICD-10-CM | POA: Diagnosis not present

## 2019-10-22 DIAGNOSIS — M4316 Spondylolisthesis, lumbar region: Secondary | ICD-10-CM | POA: Diagnosis not present

## 2019-10-22 DIAGNOSIS — I110 Hypertensive heart disease with heart failure: Secondary | ICD-10-CM | POA: Diagnosis not present

## 2019-10-22 DIAGNOSIS — I509 Heart failure, unspecified: Secondary | ICD-10-CM | POA: Diagnosis not present

## 2019-10-22 DIAGNOSIS — N816 Rectocele: Secondary | ICD-10-CM | POA: Diagnosis not present

## 2019-10-22 DIAGNOSIS — K219 Gastro-esophageal reflux disease without esophagitis: Secondary | ICD-10-CM | POA: Diagnosis not present

## 2019-10-22 DIAGNOSIS — M5136 Other intervertebral disc degeneration, lumbar region: Secondary | ICD-10-CM | POA: Diagnosis not present

## 2019-10-22 DIAGNOSIS — R195 Other fecal abnormalities: Secondary | ICD-10-CM | POA: Diagnosis not present

## 2019-10-22 DIAGNOSIS — E785 Hyperlipidemia, unspecified: Secondary | ICD-10-CM | POA: Diagnosis not present

## 2019-10-22 DIAGNOSIS — R8279 Other abnormal findings on microbiological examination of urine: Secondary | ICD-10-CM | POA: Diagnosis not present

## 2019-10-22 DIAGNOSIS — M069 Rheumatoid arthritis, unspecified: Secondary | ICD-10-CM | POA: Diagnosis not present

## 2019-10-22 DIAGNOSIS — M1990 Unspecified osteoarthritis, unspecified site: Secondary | ICD-10-CM | POA: Diagnosis not present

## 2019-10-22 DIAGNOSIS — J449 Chronic obstructive pulmonary disease, unspecified: Secondary | ICD-10-CM | POA: Diagnosis not present

## 2019-10-22 DIAGNOSIS — G5601 Carpal tunnel syndrome, right upper limb: Secondary | ICD-10-CM | POA: Diagnosis not present

## 2019-10-22 DIAGNOSIS — Z79899 Other long term (current) drug therapy: Secondary | ICD-10-CM | POA: Diagnosis not present

## 2019-10-22 DIAGNOSIS — Z7952 Long term (current) use of systemic steroids: Secondary | ICD-10-CM | POA: Diagnosis not present

## 2019-10-22 DIAGNOSIS — Z87440 Personal history of urinary (tract) infections: Secondary | ICD-10-CM | POA: Diagnosis not present

## 2019-10-22 DIAGNOSIS — E559 Vitamin D deficiency, unspecified: Secondary | ICD-10-CM | POA: Diagnosis not present

## 2019-11-02 ENCOUNTER — Ambulatory Visit: Payer: Medicare Other | Admitting: Primary Care

## 2019-11-04 ENCOUNTER — Ambulatory Visit: Payer: Medicare Other | Admitting: Critical Care Medicine

## 2019-11-16 DIAGNOSIS — M6281 Muscle weakness (generalized): Secondary | ICD-10-CM | POA: Diagnosis not present

## 2019-11-16 DIAGNOSIS — R2689 Other abnormalities of gait and mobility: Secondary | ICD-10-CM | POA: Diagnosis not present

## 2019-11-16 DIAGNOSIS — M25561 Pain in right knee: Secondary | ICD-10-CM | POA: Diagnosis not present

## 2019-11-16 DIAGNOSIS — M25562 Pain in left knee: Secondary | ICD-10-CM | POA: Diagnosis not present

## 2019-11-26 DIAGNOSIS — Z23 Encounter for immunization: Secondary | ICD-10-CM | POA: Diagnosis not present

## 2019-12-01 DIAGNOSIS — R945 Abnormal results of liver function studies: Secondary | ICD-10-CM | POA: Diagnosis not present

## 2019-12-01 DIAGNOSIS — E669 Obesity, unspecified: Secondary | ICD-10-CM | POA: Diagnosis not present

## 2019-12-01 DIAGNOSIS — Z6834 Body mass index (BMI) 34.0-34.9, adult: Secondary | ICD-10-CM | POA: Diagnosis not present

## 2019-12-01 DIAGNOSIS — M48061 Spinal stenosis, lumbar region without neurogenic claudication: Secondary | ICD-10-CM | POA: Diagnosis not present

## 2019-12-01 DIAGNOSIS — Z79899 Other long term (current) drug therapy: Secondary | ICD-10-CM | POA: Diagnosis not present

## 2019-12-01 DIAGNOSIS — M150 Primary generalized (osteo)arthritis: Secondary | ICD-10-CM | POA: Diagnosis not present

## 2019-12-01 DIAGNOSIS — J849 Interstitial pulmonary disease, unspecified: Secondary | ICD-10-CM | POA: Diagnosis not present

## 2019-12-01 DIAGNOSIS — M0579 Rheumatoid arthritis with rheumatoid factor of multiple sites without organ or systems involvement: Secondary | ICD-10-CM | POA: Diagnosis not present

## 2019-12-06 DIAGNOSIS — M1712 Unilateral primary osteoarthritis, left knee: Secondary | ICD-10-CM | POA: Diagnosis not present

## 2019-12-13 DIAGNOSIS — M1711 Unilateral primary osteoarthritis, right knee: Secondary | ICD-10-CM | POA: Diagnosis not present

## 2019-12-20 ENCOUNTER — Ambulatory Visit: Payer: Medicare Other | Admitting: Primary Care

## 2019-12-24 DIAGNOSIS — Z23 Encounter for immunization: Secondary | ICD-10-CM | POA: Diagnosis not present

## 2019-12-27 ENCOUNTER — Other Ambulatory Visit: Payer: Self-pay

## 2019-12-27 ENCOUNTER — Ambulatory Visit (INDEPENDENT_AMBULATORY_CARE_PROVIDER_SITE_OTHER): Payer: Medicare Other | Admitting: Family Medicine

## 2019-12-27 ENCOUNTER — Encounter: Payer: Self-pay | Admitting: Family Medicine

## 2019-12-27 ENCOUNTER — Other Ambulatory Visit (HOSPITAL_COMMUNITY)
Admission: RE | Admit: 2019-12-27 | Discharge: 2019-12-27 | Disposition: A | Discharge status: Home / Self Care | Payer: Medicare Other | Source: Ambulatory Visit | Attending: Critical Care Medicine | Admitting: Critical Care Medicine

## 2019-12-27 VITALS — BP 136/90 | HR 100 | Temp 97.7°F | Resp 20 | Ht 59.0 in | Wt 193.6 lb

## 2019-12-27 DIAGNOSIS — R350 Frequency of micturition: Secondary | ICD-10-CM

## 2019-12-27 DIAGNOSIS — R300 Dysuria: Secondary | ICD-10-CM

## 2019-12-27 DIAGNOSIS — Z20822 Contact with and (suspected) exposure to covid-19: Secondary | ICD-10-CM | POA: Insufficient documentation

## 2019-12-27 DIAGNOSIS — N3000 Acute cystitis without hematuria: Secondary | ICD-10-CM

## 2019-12-27 DIAGNOSIS — Z01812 Encounter for preprocedural laboratory examination: Secondary | ICD-10-CM | POA: Insufficient documentation

## 2019-12-27 LAB — POCT URINALYSIS DIPSTICK
Bilirubin, UA: NEGATIVE
Blood, UA: NEGATIVE
Glucose, UA: NEGATIVE
Ketones, UA: NEGATIVE
Nitrite, UA: NEGATIVE
Protein, UA: NEGATIVE
Spec Grav, UA: 1.02 (ref 1.010–1.025)
Urobilinogen, UA: 0.2 E.U./dL
pH, UA: 6 (ref 5.0–8.0)

## 2019-12-27 LAB — SARS CORONAVIRUS 2 (TAT 6-24 HRS): SARS Coronavirus 2: NEGATIVE

## 2019-12-27 MED ORDER — NITROFURANTOIN MACROCRYSTAL 100 MG PO CAPS: 100.0000 mg | ORAL_CAPSULE | Freq: Two times a day (BID) | ORAL | 0 refills | Status: AC

## 2019-12-27 NOTE — Patient Instructions (Addendum)
Urinary Tract Infection, Adult A urinary tract infection (UTI) is an infection of any part of the urinary tract. The urinary tract includes:  The kidneys.  The ureters.  The bladder.  The urethra. These organs make, store, and get rid of pee (urine) in the body. What are the causes? This is caused by germs (bacteria) in your genital area. These germs grow and cause swelling (inflammation) of your urinary tract. What increases the risk? You are more likely to develop this condition if:  You have a small, thin tube (catheter) to drain pee.  You cannot control when you pee or poop (incontinence).  You are female, and: ? You use these methods to prevent pregnancy:  A medicine that kills sperm (spermicide).  A device that blocks sperm (diaphragm). ? You have low levels of a female hormone (estrogen). ? You are pregnant.  You have genes that add to your risk.  You are sexually active.  You take antibiotic medicines.  You have trouble peeing because of: ? A prostate that is bigger than normal, if you are female. ? A blockage in the part of your body that drains pee from the bladder (urethra). ? A kidney stone. ? A nerve condition that affects your bladder (neurogenic bladder). ? Not getting enough to drink. ? Not peeing often enough.  You have other conditions, such as: ? Diabetes. ? A weak disease-fighting system (immune system). ? Sickle cell disease. ? Gout. ? Injury of the spine. What are the signs or symptoms? Symptoms of this condition include:  Needing to pee right away (urgently).  Peeing often.  Peeing small amounts often.  Pain or burning when peeing.  Blood in the pee.  Pee that smells bad or not like normal.  Trouble peeing.  Pee that is cloudy.  Fluid coming from the vagina, if you are female.  Pain in the belly or lower back. Other symptoms include:  Throwing up (vomiting).  No urge to eat.  Feeling mixed up (confused).  Being  tired and grouchy (irritable).  A fever.  Watery poop (diarrhea). How is this treated? This condition may be treated with:  Antibiotic medicine.  Other medicines.  Drinking enough water. Follow these instructions at home:  Medicines  Take over-the-counter and prescription medicines only as told by your doctor.  If you were prescribed an antibiotic medicine, take it as told by your doctor. Do not stop taking it even if you start to feel better. General instructions  Make sure you: ? Pee until your bladder is empty. ? Do not hold pee for a long time. ? Empty your bladder after sex. ? Wipe from front to back after pooping if you are a female. Use each tissue one time when you wipe.  Drink enough fluid to keep your pee pale yellow.  Keep all follow-up visits as told by your doctor. This is important. Contact a doctor if:  You do not get better after 1-2 days.  Your symptoms go away and then come back. Get help right away if:  You have very bad back pain.  You have very bad pain in your lower belly.  You have a fever.  You are sick to your stomach (nauseous).  You are throwing up. Summary  A urinary tract infection (UTI) is an infection of any part of the urinary tract.  This condition is caused by germs in your genital area.  There are many risk factors for a UTI. These include having a small,  tube to drain pee and not being able to control when you pee or poop.  Treatment includes antibiotic medicines for germs.  Drink enough fluid to keep your pee pale yellow. This information is not intended to replace advice given to you by your health care provider. Make sure you discuss any questions you have with your health care provider. Document Revised: 10/15/2018 Document Reviewed: 05/07/2018 Elsevier Patient Education  2020 Elsevier Inc. Nitrofurantoin tablets or capsules What is this medicine? NITROFURANTOIN (nye troe fyoor AN toyn) is an antibiotic. It is used  to treat urinary tract infections. This medicine may be used for other purposes; ask your health care provider or pharmacist if you have questions. COMMON BRAND NAME(S): Macrobid, Macrodantin, Urotoin What should I tell my health care provider before I take this medicine? They need to know if you have any of these conditions:  anemia  diabetes  glucose-6-phosphate dehydrogenase deficiency  kidney disease  liver disease  lung disease  other chronic illness  an unusual or allergic reaction to nitrofurantoin, other antibiotics, other medicines, foods, dyes or preservatives  pregnant or trying to get pregnant  breast-feeding How should I use this medicine? Take this medicine by mouth with a glass of water. Follow the directions on the prescription label. Take this medicine with food or milk. Take your doses at regular intervals. Do not take your medicine more often than directed. Do not stop taking except on your doctor's advice. Talk to your pediatrician regarding the use of this medicine in children. While this drug may be prescribed for selected conditions, precautions do apply. Overdosage: If you think you have taken too much of this medicine contact a poison control center or emergency room at once. NOTE: This medicine is only for you. Do not share this medicine with others. What if I miss a dose? If you miss a dose, take it as soon as you can. If it is almost time for your next dose, take only that dose. Do not take double or extra doses. What may interact with this medicine?  antacids containing magnesium trisilicate  probenecid  quinolone antibiotics like ciprofloxacin, lomefloxacin, norfloxacin and ofloxacin  sulfinpyrazone This list may not describe all possible interactions. Give your health care provider a list of all the medicines, herbs, non-prescription drugs, or dietary supplements you use. Also tell them if you smoke, drink alcohol, or use illegal drugs. Some  items may interact with your medicine. What should I watch for while using this medicine? Tell your doctor or health care professional if your symptoms do not improve or if you get new symptoms. Drink several glasses of water a day. If you are taking this medicine for a long time, visit your doctor for regular checks on your progress. If you are diabetic, you may get a false positive result for sugar in your urine with certain brands of urine tests. Check with your doctor. What side effects may I notice from receiving this medicine? Side effects that you should report to your doctor or health care professional as soon as possible:  allergic reactions like skin rash or hives, swelling of the face, lips, or tongue  chest pain  cough  difficulty breathing  dizziness, drowsiness  fever or infection  joint aches or pains  pale or blue-tinted skin  redness, blistering, peeling or loosening of the skin, including inside the mouth  tingling, burning, pain, or numbness in hands or feet  unusual bleeding or bruising  unusually weak or tired  yellowing   of eyes or skin Side effects that usually do not require medical attention (report to your doctor or health care professional if they continue or are bothersome):  dark urine  diarrhea  headache  loss of appetite  nausea or vomiting  temporary hair loss This list may not describe all possible side effects. Call your doctor for medical advice about side effects. You may report side effects to FDA at 1-800-FDA-1088. Where should I keep my medicine? Keep out of the reach of children. Store at room temperature between 15 and 30 degrees C (59 and 86 degrees F). Protect from light. Throw away any unused medicine after the expiration date. NOTE: This sheet is a summary. It may not cover all possible information. If you have questions about this medicine, talk to your doctor, pharmacist, or health care provider.  2020 Elsevier/Gold  Standard (2008-05-18 15:56:47)  

## 2019-12-27 NOTE — Progress Notes (Signed)
Established Patient Office Visit  Subjective:  Patient ID: Sherry Burke, female    DOB: 28-Feb-1941  Age: 79 y.o. MRN: [id number]  CC:  Chief Complaint  Patient presents with  . Urinary Tract Infection    HPI Michelina Mexicano presents for dysuria and urinary frequency. Cheyan states her urine has been cloudy with a strong offensive odor for 3-4 days. She said that she has taken one OTC pyridium for symptoms. She denies fever or hematuria. Dorethy also denies a history of chronic recurrent UTI.  Past Medical History:  Diagnosis Date  . Acute renal insufficiency   . Asthma   . Chronic bronchitis   . COPD (chronic obstructive pulmonary disease) ([place])   . Depression   . History of migraine headaches   . Hyperlipemia   . OSA (obstructive sleep apnea)    pt states she doesn't have it  . Osteoarthritis   . Pneumonia   . Rheumatoid arthritis(714.0)   . RLS (restless legs syndrome)     Past Surgical History:  Procedure Laterality Date  . KNEE SURGERY Bilateral    when younger  . TOTAL ABDOMINAL HYSTERECTOMY  06/1988    Family History  Problem Relation Age of Onset  . Lung cancer Father   . Stroke Mother   . Hypertension Mother   . Diabetes Mother   . Rheum arthritis Sister   . Rheum arthritis Sister     Social History   Socioeconomic History  . Marital status: Married    Spouse name: Durene Fruits  . Number of children: 3  . Years of education: Not on file  . Highest education level: Not on file  Occupational History  . Occupation: retired    Comment: homemaker  Tobacco Use  . Smoking status: Passive Smoke Exposure - Never Smoker  . Smokeless tobacco: Never Used  Substance and Sexual Activity  . Alcohol use: No  . Drug use: No  . Sexual activity: Not Currently    Birth control/protection: None    Comment: Married  Other Topics Concern  . Not on file  Social History Narrative   Lives with her husband, who is very helpful and supportive.   Social Determinants of Health    Financial Resource Strain: Low Risk   . Difficulty of Paying Living Expenses: Not hard at all  Food Insecurity: No Food Insecurity  . Worried About Charity fundraiser in the Last Year: Never true  . Ran Out of Food in the Last Year: Never true  Transportation Needs: No Transportation Needs  . Lack of Transportation (Medical): No  . Lack of Transportation (Non-Medical): No  Physical Activity: Sufficiently Active  . Days of Exercise per Week: 7 days  . Minutes of Exercise per Session: 60 min  Stress: No Stress Concern Present  . Feeling of Stress : Only a little  Social Connections: Not Isolated  . Frequency of Communication with Friends and Family: More than three times a week  . Frequency of Social Gatherings with Friends and Family: Once a week  . Attends Religious Services: More than 4 times per year  . Active Member of Clubs or Organizations: Yes  . Attends Archivist Meetings: 1 to 4 times per year  . Marital Status: Married  Human resources officer Violence: Not At Risk  . Fear of Current or Ex-Partner: No  . Emotionally Abused: No  . Physically Abused: No  . Sexually Abused: No    Outpatient Medications Prior to Visit  Medication Sig  Dispense Refill  . atorvastatin (LIPITOR) 40 MG tablet     . cloNIDine (CATAPRES) 0.1 MG tablet     . gabapentin (NEURONTIN) 300 MG capsule     . HYDROcodone-acetaminophen (NORCO/VICODIN) 5-325 MG tablet TAKE 1 TABLET BY MOUTH EVERY 12 HOURS AS NEEDED FOR ARTHRITIC PAIN    . hydroxychloroquine (PLAQUENIL) 200 MG tablet Take 200 mg by mouth 2 (two) times daily.    Marland Kitchen lisinopril (PRINIVIL,ZESTRIL) 10 MG tablet     . omeprazole (PRILOSEC) 40 MG capsule     . PARoxetine (PAXIL-CR) 25 MG 24 hr tablet Take 25 mg by mouth daily.    Marland Kitchen rOPINIRole (REQUIP) 1 MG tablet Take 1 mg by mouth 2 (two) times daily. Take 1 tablet every morning and 2 at bedtime    . spironolactone (ALDACTONE) 25 MG tablet     . Tofacitinib Citrate (XELJANZ) 5 MG TABS Take  5 mg by mouth in the morning and at bedtime.    Marland Kitchen albuterol (VENTOLIN HFA) 108 (90 BASE) MCG/ACT inhaler Inhale 1-2 puffs into the lungs every 4 (four) hours as needed for shortness of breath.     . Cholecalciferol (VITAMIN D3) 2000 UNITS TABS Take 1 tablet by mouth daily.    . diphenhydramine-acetaminophen (TYLENOL PM) 25-500 MG TABS tablet Take 1 tablet by mouth at bedtime as needed.    Marland Kitchen amLODipine (NORVASC) 5 MG tablet Take 5 mg by mouth daily.      . nabumetone (RELAFEN) 750 MG tablet Take 750 mg by mouth 2 (two) times daily.      Marland Kitchen PARoxetine (PAXIL) 20 MG tablet Take 20 mg by mouth at bedtime.     Marland Kitchen rOPINIRole (REQUIP) 1 MG tablet 2 every morning and 3 at bedtime     No facility-administered medications prior to visit.    Allergies  Allergen Reactions  . Sulfa Antibiotics Nausea And Vomiting  . Actemra [Tocilizumab]   . Amlodipine   . Celebrex [Celecoxib]   . Methotrexate Derivatives Other (See Comments)    sweating  . Niaspan [Niacin]     Severe flushing  . Remicade [Infliximab] Nausea And Vomiting and Other (See Comments)    Affects nervous system  . Welchol [Colesevelam] Other (See Comments)    Abdominal pain and headache    ROS Review of Systems  Constitutional: Negative for appetite change, chills and fever.  Respiratory: Negative for shortness of breath.   Cardiovascular: Negative for chest pain.  Gastrointestinal: Negative for abdominal pain, diarrhea, nausea and vomiting.  Genitourinary: Positive for dysuria, frequency and urgency. Negative for flank pain, hematuria, vaginal bleeding, vaginal discharge and vaginal pain.  Musculoskeletal: Positive for back pain and myalgias.       Chronic from RA and osteoarthritis.  Skin: Negative for rash and wound.  Neurological: Negative for dizziness, light-headedness and headaches.  Psychiatric/Behavioral: Negative for sleep disturbance.      Objective:    Physical Exam  Constitutional: She is oriented to person,  place, and time. She appears well-developed and well-nourished.  HENT:  Head: Normocephalic.  Cardiovascular: Normal rate and regular rhythm.  Pulmonary/Chest: Effort normal and breath sounds normal.  Abdominal: Soft. Bowel sounds are normal. There is no abdominal tenderness.  Musculoskeletal:        General: Tenderness present.     Comments: Chronic back pain, ambulates with cane  Neurological: She is alert and oriented to person, place, and time.  Skin: Skin is warm and dry.  Psychiatric: She has a normal mood and  affect. Her behavior is normal. Judgment and thought content normal.    BP 136/90 (BP Location: Left Arm, Patient Position: Sitting, Cuff Size: Normal)   Pulse 100   Temp 97.7 F (36.5 C)   Resp 20   Ht 4\' 11"  (1.499 m)   Wt 193 lb 9.6 oz (87.8 kg)   BMI 39.10 kg/m  Wt Readings from Last 3 Encounters:  12/27/19 193 lb 9.6 oz (87.8 kg)  09/02/19 175 lb 9.6 oz (79.7 kg)  01/29/19 173 lb (78.5 kg)     Health Maintenance Due  Topic Date Due  . TETANUS/TDAP  10/15/1960  . DEXA SCAN  10/15/2006  . PNA vac Low Risk Adult (1 of 2 - PCV13) 10/15/2006    There are no preventive care reminders to display for this patient.  No results found for: TSH Lab Results  Component Value Date   WBC 7.6 08/23/2013   HGB 12.9 08/23/2013   HCT 39.5 08/23/2013   MCV 87.8 08/23/2013   PLT 161 08/23/2013   Lab Results  Component Value Date   NA 144 08/23/2013   K 4.6 08/23/2013   CO2 26 08/23/2013   GLUCOSE 90 08/23/2013   BUN 24 (H) 08/23/2013   CREATININE 0.77 08/23/2013   BILITOT 0.4 08/23/2013   ALKPHOS 68 08/23/2013   AST 26 08/23/2013   ALT 26 08/23/2013   PROT 7.0 08/23/2013   ALBUMIN 4.0 08/23/2013   CALCIUM 9.3 08/23/2013   No results found for: CHOL No results found for: HDL No results found for: LDLCALC No results found for: TRIG No results found for: CHOLHDL No results found for: HGBA1C    Assessment & Plan:   Problem List Items Addressed This  Visit    None    Visit Diagnoses    Acute cystitis without hematuria    -  Primary   Relevant Medications   nitrofurantoin (MACRODANTIN) 100 MG capsule   Other Relevant Orders   POCT urinalysis dipstick   Urine culture   Frequent urination       Relevant Medications   nitrofurantoin (MACRODANTIN) 100 MG capsule   Other Relevant Orders   Urinalysis Dipstick (Completed)   POCT urinalysis dipstick   Dysuria       Relevant Medications   nitrofurantoin (MACRODANTIN) 100 MG capsule   Other Relevant Orders   POCT urinalysis dipstick      Meds ordered this encounter  Medications  . nitrofurantoin (MACRODANTIN) 100 MG capsule    Sig: Take 1 capsule (100 mg total) by mouth 2 (two) times daily for 7 days.    Dispense:  14 capsule    Refill:  0    Follow-up: Return if symptoms worsen or fail to improve.    Rip Harbour, RN

## 2019-12-29 LAB — URINE CULTURE: Organism ID, Bacteria: NO GROWTH

## 2019-12-30 ENCOUNTER — Ambulatory Visit: Payer: Medicare Other | Admitting: Pulmonary Disease

## 2019-12-30 ENCOUNTER — Ambulatory Visit: Payer: Medicare Other | Admitting: Critical Care Medicine

## 2020-01-03 ENCOUNTER — Other Ambulatory Visit: Payer: Self-pay

## 2020-01-03 MED ORDER — HYDROXYCHLOROQUINE SULFATE 200 MG PO TABS: 200.0000 mg | ORAL_TABLET | Freq: Two times a day (BID) | ORAL | 2 refills | Status: AC

## 2020-01-04 ENCOUNTER — Other Ambulatory Visit: Payer: Self-pay | Admitting: Family Medicine

## 2020-01-05 ENCOUNTER — Other Ambulatory Visit: Payer: Self-pay | Admitting: Family Medicine

## 2020-01-05 MED ORDER — HYDROCODONE-ACETAMINOPHEN 5-325 MG PO TABS: 1.0000 | ORAL_TABLET | Freq: Four times a day (QID) | ORAL | 0 refills | Status: DC | PRN

## 2020-01-05 MED ORDER — GABAPENTIN 300 MG PO CAPS: 300.0000 mg | ORAL_CAPSULE | Freq: Two times a day (BID) | ORAL | 1 refills | Status: DC

## 2020-01-27 ENCOUNTER — Other Ambulatory Visit: Payer: Self-pay

## 2020-02-01 ENCOUNTER — Ambulatory Visit: Payer: Medicare Other | Admitting: Family Medicine

## 2020-02-09 ENCOUNTER — Other Ambulatory Visit: Payer: Self-pay | Admitting: Family Medicine

## 2020-02-09 DIAGNOSIS — Z1231 Encounter for screening mammogram for malignant neoplasm of breast: Secondary | ICD-10-CM | POA: Diagnosis not present

## 2020-02-11 DIAGNOSIS — R1084 Generalized abdominal pain: Secondary | ICD-10-CM | POA: Diagnosis not present

## 2020-02-11 DIAGNOSIS — I10 Essential (primary) hypertension: Secondary | ICD-10-CM | POA: Diagnosis not present

## 2020-02-11 DIAGNOSIS — R1013 Epigastric pain: Secondary | ICD-10-CM | POA: Diagnosis not present

## 2020-02-11 DIAGNOSIS — R000 Tachycardia, unspecified: Secondary | ICD-10-CM | POA: Diagnosis not present

## 2020-02-11 DIAGNOSIS — B9689 Other specified bacterial agents as the cause of diseases classified elsewhere: Secondary | ICD-10-CM | POA: Diagnosis not present

## 2020-02-11 DIAGNOSIS — R9431 Abnormal electrocardiogram [ECG] [EKG]: Secondary | ICD-10-CM | POA: Diagnosis not present

## 2020-02-11 DIAGNOSIS — R1110 Vomiting, unspecified: Secondary | ICD-10-CM | POA: Diagnosis not present

## 2020-02-11 DIAGNOSIS — R1111 Vomiting without nausea: Secondary | ICD-10-CM | POA: Diagnosis not present

## 2020-02-11 DIAGNOSIS — N390 Urinary tract infection, site not specified: Secondary | ICD-10-CM | POA: Diagnosis not present

## 2020-02-11 DIAGNOSIS — R52 Pain, unspecified: Secondary | ICD-10-CM | POA: Diagnosis not present

## 2020-02-12 DIAGNOSIS — M069 Rheumatoid arthritis, unspecified: Secondary | ICD-10-CM | POA: Diagnosis not present

## 2020-02-12 DIAGNOSIS — R079 Chest pain, unspecified: Secondary | ICD-10-CM | POA: Diagnosis not present

## 2020-02-12 DIAGNOSIS — R1013 Epigastric pain: Secondary | ICD-10-CM | POA: Diagnosis not present

## 2020-02-12 DIAGNOSIS — E785 Hyperlipidemia, unspecified: Secondary | ICD-10-CM | POA: Diagnosis not present

## 2020-02-12 DIAGNOSIS — R112 Nausea with vomiting, unspecified: Secondary | ICD-10-CM | POA: Diagnosis not present

## 2020-02-12 DIAGNOSIS — I10 Essential (primary) hypertension: Secondary | ICD-10-CM | POA: Diagnosis not present

## 2020-02-12 DIAGNOSIS — N189 Chronic kidney disease, unspecified: Secondary | ICD-10-CM

## 2020-02-12 DIAGNOSIS — J8410 Pulmonary fibrosis, unspecified: Secondary | ICD-10-CM | POA: Diagnosis not present

## 2020-02-13 DIAGNOSIS — Z888 Allergy status to other drugs, medicaments and biological substances status: Secondary | ICD-10-CM | POA: Diagnosis not present

## 2020-02-13 DIAGNOSIS — N189 Chronic kidney disease, unspecified: Secondary | ICD-10-CM | POA: Diagnosis not present

## 2020-02-13 DIAGNOSIS — G2581 Restless legs syndrome: Secondary | ICD-10-CM | POA: Diagnosis present

## 2020-02-13 DIAGNOSIS — K319 Disease of stomach and duodenum, unspecified: Secondary | ICD-10-CM | POA: Diagnosis not present

## 2020-02-13 DIAGNOSIS — I951 Orthostatic hypotension: Secondary | ICD-10-CM | POA: Diagnosis not present

## 2020-02-13 DIAGNOSIS — R109 Unspecified abdominal pain: Secondary | ICD-10-CM | POA: Diagnosis not present

## 2020-02-13 DIAGNOSIS — R112 Nausea with vomiting, unspecified: Secondary | ICD-10-CM | POA: Diagnosis not present

## 2020-02-13 DIAGNOSIS — R519 Headache, unspecified: Secondary | ICD-10-CM | POA: Diagnosis present

## 2020-02-13 DIAGNOSIS — I351 Nonrheumatic aortic (valve) insufficiency: Secondary | ICD-10-CM | POA: Diagnosis not present

## 2020-02-13 DIAGNOSIS — R1013 Epigastric pain: Secondary | ICD-10-CM | POA: Diagnosis not present

## 2020-02-13 DIAGNOSIS — K279 Peptic ulcer, site unspecified, unspecified as acute or chronic, without hemorrhage or perforation: Secondary | ICD-10-CM | POA: Diagnosis not present

## 2020-02-13 DIAGNOSIS — Z79891 Long term (current) use of opiate analgesic: Secondary | ICD-10-CM | POA: Diagnosis not present

## 2020-02-13 DIAGNOSIS — Z8719 Personal history of other diseases of the digestive system: Secondary | ICD-10-CM | POA: Diagnosis not present

## 2020-02-13 DIAGNOSIS — M069 Rheumatoid arthritis, unspecified: Secondary | ICD-10-CM | POA: Diagnosis not present

## 2020-02-13 DIAGNOSIS — Z87440 Personal history of urinary (tract) infections: Secondary | ICD-10-CM | POA: Diagnosis not present

## 2020-02-13 DIAGNOSIS — E860 Dehydration: Secondary | ICD-10-CM | POA: Diagnosis present

## 2020-02-13 DIAGNOSIS — Z79899 Other long term (current) drug therapy: Secondary | ICD-10-CM | POA: Diagnosis not present

## 2020-02-13 DIAGNOSIS — R079 Chest pain, unspecified: Secondary | ICD-10-CM | POA: Diagnosis not present

## 2020-02-13 DIAGNOSIS — Z7952 Long term (current) use of systemic steroids: Secondary | ICD-10-CM | POA: Diagnosis not present

## 2020-02-13 DIAGNOSIS — I129 Hypertensive chronic kidney disease with stage 1 through stage 4 chronic kidney disease, or unspecified chronic kidney disease: Secondary | ICD-10-CM | POA: Diagnosis not present

## 2020-02-13 DIAGNOSIS — J8410 Pulmonary fibrosis, unspecified: Secondary | ICD-10-CM | POA: Diagnosis not present

## 2020-02-14 ENCOUNTER — Encounter: Payer: Self-pay | Admitting: Family Medicine

## 2020-02-14 DIAGNOSIS — I351 Nonrheumatic aortic (valve) insufficiency: Secondary | ICD-10-CM

## 2020-02-14 DIAGNOSIS — R1013 Epigastric pain: Secondary | ICD-10-CM | POA: Diagnosis not present

## 2020-02-14 DIAGNOSIS — R112 Nausea with vomiting, unspecified: Secondary | ICD-10-CM | POA: Diagnosis not present

## 2020-02-14 DIAGNOSIS — R079 Chest pain, unspecified: Secondary | ICD-10-CM | POA: Diagnosis not present

## 2020-02-16 ENCOUNTER — Telehealth: Payer: Self-pay

## 2020-02-16 NOTE — Telephone Encounter (Signed)
Transition Care Management Follow-up Telephone Call  Sherry Burke 1941/10/14  Admit Date: 02/13/2020 Discharge Date: 02/15/2020 Diagnoses: Atypical Chest Pain, Dyspepsia and disorder of function of stomach   2 day post discharge: 02/17/20 7 day post discharge: 02/22/20 14 day post discharge: 02/29/20  Sherry Burke was discharged from [facility] on 02/15/20 with the diagnoses listed above.  She was contacted today via telephone in regards to transition of care.  She states that the pain is better and she is taking her medication as instructed.  Patient admitted with c/o epigastric abdominal pain, retrosternal chest pain with nausea and vomiting  Troponin Negative, 2D Echo normal with some expected diastolic dysfunction Grade 1  Increased PPI to twice daily, will benefit with upper GI endoscopy due to prolonged dyspepsia.   Discharge Instructions: Follow-up with PCP in one week  Items Reviewed:  Medications reviewed: yes  Allergies reviewed: yes  Dietary changes reviewed: yes  Referrals reviewed: yes     Confirmed importance and date/time of follow-up visits scheduled yes  Patient has appointment with Dr Tobie Poet on 02/25/20  Confirmed with patient if condition begins to worsen call PCP or go to the ER.  Patient was given the office number and encouraged to call back with question or concerns.  : yes

## 2020-02-16 NOTE — Telephone Encounter (Signed)
That sounds good. Excellent documentation. kc

## 2020-02-22 NOTE — Progress Notes (Addendum)
Established Patient Office Visit  Subjective:  Patient ID: Sherry Burke, female    DOB: Apr 01, 1941  Age: 79 y.o. MRN: [id number]  CC:  Chief Complaint  Patient presents with  . Hypertension  . Hyperlipidemia    HPI Sherry Burke presents for follow up OSA, COPD, RA, HTN, hyperlipidemia.  OSA - not wearing cpap. It is frustrating because it is difficult to keep it on and clean it daily.   Hypertension/hyperipidemia. Eating healthy. Not exercising because unable to do to pain. Compliant with taking and tolerating medicines - list reviewed.   Rheumatoid arthritis: Taking xeljanz 5 mg one twice a day. Helps arthritis, but has disturbing dreams and persistent headaches. Tylenol nor hydrocodone helps pain enough. Awakes with night sweats. Has an upcoming appt with Rheumatology.  Depression - worsening. Was on paxil for years, but currently on nothing. Would like to try something again. Denies suicidal thoughts.   Diabetes has been well controlled. She tries to eat healthy. Unable to exercise. On no medications. She does not check her sugars daily.  Past Medical History:  Diagnosis Date  . Acute renal insufficiency   . Asthma   . Chronic bronchitis   . COPD (chronic obstructive pulmonary disease) ([place])   . Depression   . History of migraine headaches   . Hyperlipemia   . OSA (obstructive sleep apnea)    pt states she doesn't have it  . Osteoarthritis   . Pneumonia   . Rheumatoid arthritis(714.0)   . RLS (restless legs syndrome)     Past Surgical History:  Procedure Laterality Date  . KNEE SURGERY Bilateral    when younger  . TOTAL ABDOMINAL HYSTERECTOMY  06/1988    Family History  Problem Relation Age of Onset  . Lung cancer Father   . Stroke Mother   . Hypertension Mother   . Diabetes Mother   . Rheum arthritis Sister   . Rheum arthritis Sister     Social History   Socioeconomic History  . Marital status: Married    Spouse name: Durene Fruits  . Number of children:  3  . Years of education: Not on file  . Highest education level: Not on file  Occupational History  . Occupation: retired    Comment: homemaker  Tobacco Use  . Smoking status: Passive Smoke Exposure - Never Smoker  . Smokeless tobacco: Never Used  Substance and Sexual Activity  . Alcohol use: No  . Drug use: No  . Sexual activity: Not Currently    Birth control/protection: None    Comment: Married  Other Topics Concern  . Not on file  Social History Narrative   Lives with her husband, who is very helpful and supportive.   Social Determinants of Health   Financial Resource Strain: Low Risk   . Difficulty of Paying Living Expenses: Not hard at all  Food Insecurity: No Food Insecurity  . Worried About Charity fundraiser in the Last Year: Never true  . Ran Out of Food in the Last Year: Never true  Transportation Needs: No Transportation Needs  . Lack of Transportation (Medical): No  . Lack of Transportation (Non-Medical): No  Physical Activity: Sufficiently Active  . Days of Exercise per Week: 7 days  . Minutes of Exercise per Session: 60 min  Stress: No Stress Concern Present  . Feeling of Stress : Only a little  Social Connections: Not Isolated  . Frequency of Communication with Friends and Family: More than three times a week  .  Frequency of Social Gatherings with Friends and Family: Once a week  . Attends Religious Services: More than 4 times per year  . Active Member of Clubs or Organizations: Yes  . Attends Archivist Meetings: 1 to 4 times per year  . Marital Status: Married  Human resources officer Violence: Not At Risk  . Fear of Current or Ex-Partner: No  . Emotionally Abused: No  . Physically Abused: No  . Sexually Abused: No    Outpatient Medications Prior to Visit  Medication Sig Dispense Refill  . albuterol (VENTOLIN HFA) 108 (90 BASE) MCG/ACT inhaler Inhale 1-2 puffs into the lungs every 4 (four) hours as needed for shortness of breath.     Marland Kitchen  atorvastatin (LIPITOR) 40 MG tablet     . cloNIDine (CATAPRES) 0.1 MG tablet Take 1 tablet by mouth twice daily 180 tablet 0  . diphenhydramine-acetaminophen (TYLENOL PM) 25-500 MG TABS tablet Take 1 tablet by mouth at bedtime as needed.    Marland Kitchen estradiol (ESTRACE VAGINAL) 0.1 MG/GM vaginal cream     . furosemide (LASIX) 40 MG tablet Take 40 mg by mouth daily.    Marland Kitchen gabapentin (NEURONTIN) 300 MG capsule Take 1 capsule (300 mg total) by mouth 2 (two) times daily for 180 doses. 180 capsule 1  . HYDROcodone-acetaminophen (NORCO/VICODIN) 5-325 MG tablet Take 1 tablet by mouth every 6 (six) hours as needed for moderate pain. 120 tablet 0  . hydroxychloroquine (PLAQUENIL) 200 MG tablet Take 1 tablet (200 mg total) by mouth 2 (two) times daily. 60 tablet 2  . lisinopril (ZESTRIL) 10 MG tablet Take 1 tablet by mouth once daily 90 tablet 0  . mometasone-formoterol (DULERA) 200-5 MCG/ACT AERO Inhale into the lungs.    . polyethylene glycol (MIRALAX / GLYCOLAX) 17 g packet Take 17 g by mouth daily. Use daily as needed for constipation    . rOPINIRole (REQUIP) 1 MG tablet Take 1 mg by mouth 2 (two) times daily. Take 1 tablet every morning and 2 at bedtime    . spironolactone (ALDACTONE) 25 MG tablet     . Tofacitinib Citrate (XELJANZ) 5 MG TABS Take 5 mg by mouth in the morning and at bedtime.    . Vitamin D, Ergocalciferol, (DRISDOL) 1.25 MG (50000 UNIT) CAPS capsule Take 1 capsule by mouth once a week 12 capsule 0  . omeprazole (PRILOSEC) 40 MG capsule TAKE 1 CAPSULE DAILY 90 capsule 0  . Cholecalciferol (VITAMIN D3) 2000 UNITS TABS Take 1 tablet by mouth daily.    Marland Kitchen PARoxetine (PAXIL-CR) 25 MG 24 hr tablet Take 25 mg by mouth daily.     No facility-administered medications prior to visit.    Allergies  Allergen Reactions  . Sulfa Antibiotics Nausea And Vomiting  . Actemra [Tocilizumab]   . Amlodipine   . Celebrex [Celecoxib]   . Methotrexate Derivatives Other (See Comments)    sweating  . Niaspan  [Niacin]     Severe flushing  . Remicade [Infliximab] Nausea And Vomiting and Other (See Comments)    Affects nervous system  . Welchol [Colesevelam] Other (See Comments)    Abdominal pain and headache    ROS Review of Systems  Constitutional: Negative for chills, fatigue and fever.  HENT: Negative for congestion, ear pain, rhinorrhea and sore throat.   Respiratory: Positive for cough. Negative for shortness of breath.        Little  Cardiovascular: Negative for chest pain.  Gastrointestinal: Negative for abdominal pain, constipation, diarrhea, nausea and vomiting.  Genitourinary: Negative for dysuria and urgency.  Musculoskeletal: Negative for back pain and myalgias.  Skin: Positive for rash (on legs since hospitalization in August 2020. It does not itch. Has seen dermatology without any improvement. ).  Neurological: Negative for dizziness, weakness, light-headedness and headaches.  Psychiatric/Behavioral: Positive for dysphoric mood. The patient is not nervous/anxious.       Objective:    Physical Exam  Constitutional: She appears well-developed and well-nourished.  Cardiovascular: Normal rate, regular rhythm and normal heart sounds.  Pulmonary/Chest: Effort normal and breath sounds normal.  Abdominal: Bowel sounds are normal. There is no abdominal tenderness.  Musculoskeletal:     Comments: Using walker. Moving slowly.  Neurological: She is alert.  Skin: Rash (blanching rash - sporadic over legs. macular nonpapular. ) noted.  Psychiatric: Her behavior is normal.  Flat affect.    BP 138/72   Pulse 100   Temp (!) 97.3 F (36.3 C)   Resp 18   Ht 5' (1.524 m)   Wt 190 lb 3.2 oz (86.3 kg)   BMI 37.15 kg/m  Wt Readings from Last 3 Encounters:  02/25/20 190 lb 3.2 oz (86.3 kg)  12/27/19 193 lb 9.6 oz (87.8 kg)  09/02/19 175 lb 9.6 oz (79.7 kg)     Health Maintenance Due  Topic Date Due  . FOOT EXAM  Never done  . OPHTHALMOLOGY EXAM  Never done  . TETANUS/TDAP   Never done  . DEXA SCAN  Never done  . PNA vac Low Risk Adult (1 of 2 - PCV13) Never done    There are no preventive care reminders to display for this patient.  No results found for: TSH Lab Results  Component Value Date   WBC 7.6 08/23/2013   HGB 12.9 08/23/2013   HCT 39.5 08/23/2013   MCV 87.8 08/23/2013   PLT 161 08/23/2013   Lab Results  Component Value Date   NA 144 08/23/2013   K 4.6 08/23/2013   CO2 26 08/23/2013   GLUCOSE 90 08/23/2013   BUN 24 (H) 08/23/2013   CREATININE 0.77 08/23/2013   BILITOT 0.4 08/23/2013   ALKPHOS 68 08/23/2013   AST 26 08/23/2013   ALT 26 08/23/2013   PROT 7.0 08/23/2013   ALBUMIN 4.0 08/23/2013   CALCIUM 9.3 08/23/2013   Lab Results  Component Value Date   CHOL 162 02/25/2020   Lab Results  Component Value Date   HDL 52 02/25/2020   Lab Results  Component Value Date   LDLCALC 79 02/25/2020   Lab Results  Component Value Date   TRIG 186 (H) 02/25/2020   Lab Results  Component Value Date   CHOLHDL 3.1 02/25/2020   Lab Results  Component Value Date   HGBA1C 5.8 (H) 02/25/2020      Assessment & Plan:  1. Essential hypertension The current medical regimen is effective;  continue present plan and medications.  2. Mixed hyperlipidemia Fairly well controlled.  No changes to medicines.  Continue to work on eating a healthy diet and exercise.  Labs drawn today.  - Lipid panel  3. Dyslipidemia associated with type 2 diabetes mellitus ([place]) Control: Great! Does not need to check sugars. Recommend check feet daily. Recommend annual eye exams. Medicines: none Continue to work on eating a healthy diet and exercise.  Labs drawn today.   - Hemoglobin A1c  4. Rheumatoid arthritis involving both shoulders with positive rheumatoid factor (HCC) Poorly controlled. Continue current medicines. Discuss with rheumatology.  5. Simple chronic bronchitis (  [place]) The current medical regimen is effective;  continue present plan and  medications.  6. GERD without esophagitis The current medical regimen is effective;  continue present plan and medications. - omeprazole (PRILOSEC) 40 MG capsule; Take 1 capsule (40 mg total) by mouth 2 (two) times daily.  Dispense: 180 capsule; Refill: 3  7. Moderate recurrent major depression (HCC) Start paxil cr.  - PARoxetine (PAXIL CR) 25 MG 24 hr tablet; Take 1 tablet (25 mg total) by mouth daily.  Dispense: 30 tablet; Refill: 2  8. Acute cystitis without hematuria Macrobid rx given. - POCT urinalysis dipstick - Urine Culture  9. Class 2 severe obesity due to excess calories with serious comorbidity and body mass index (BMI) of 37.0 to 37.9 in adult [facility]) Recommend continue to work on diet. Doing chair exercises. Walking in house to help with weakness.  10. Dermatitis - monitor. Creams have not helped. Asymptomatic (does not itch or hurt.)  Orders Placed This Encounter  Procedures  . Urine Culture  . Lipid panel  . Hemoglobin A1c  . Cardiovascular Risk Assessment  . POCT urinalysis dipstick   Follow-up: Return in about 3 months (around 05/26/2020) for fasting.    [name], MD

## 2020-02-23 ENCOUNTER — Telehealth: Payer: Self-pay | Admitting: Family Medicine

## 2020-02-23 NOTE — Progress Notes (Signed)
  Chronic Care Management   Outreach Note  02/23/2020 Name: Sherry Burke MRN: [id number] DOB: June 22, 1941  Referred by: [name], MD Reason for referral : No chief complaint on file.   An unsuccessful telephone outreach was attempted today. The patient was referred to the pharmacist for assistance with care management and care coordination.   Follow Up Plan:   Earney Hamburg Upstream Scheduler

## 2020-02-25 ENCOUNTER — Other Ambulatory Visit: Payer: Self-pay

## 2020-02-25 ENCOUNTER — Other Ambulatory Visit: Payer: Self-pay | Admitting: Family Medicine

## 2020-02-25 ENCOUNTER — Ambulatory Visit (INDEPENDENT_AMBULATORY_CARE_PROVIDER_SITE_OTHER): Payer: Medicare Other | Admitting: Family Medicine

## 2020-02-25 ENCOUNTER — Encounter: Payer: Self-pay | Admitting: Family Medicine

## 2020-02-25 VITALS — BP 138/72 | HR 100 | Temp 97.3°F | Resp 18 | Ht 60.0 in | Wt 190.2 lb

## 2020-02-25 DIAGNOSIS — N3000 Acute cystitis without hematuria: Secondary | ICD-10-CM | POA: Diagnosis not present

## 2020-02-25 DIAGNOSIS — E785 Hyperlipidemia, unspecified: Secondary | ICD-10-CM

## 2020-02-25 DIAGNOSIS — J410 Simple chronic bronchitis: Secondary | ICD-10-CM

## 2020-02-25 DIAGNOSIS — K219 Gastro-esophageal reflux disease without esophagitis: Secondary | ICD-10-CM | POA: Diagnosis not present

## 2020-02-25 DIAGNOSIS — F331 Major depressive disorder, recurrent, moderate: Secondary | ICD-10-CM | POA: Diagnosis not present

## 2020-02-25 DIAGNOSIS — E1159 Type 2 diabetes mellitus with other circulatory complications: Secondary | ICD-10-CM | POA: Insufficient documentation

## 2020-02-25 DIAGNOSIS — I10 Essential (primary) hypertension: Secondary | ICD-10-CM | POA: Diagnosis not present

## 2020-02-25 DIAGNOSIS — M05712 Rheumatoid arthritis with rheumatoid factor of left shoulder without organ or systems involvement: Secondary | ICD-10-CM

## 2020-02-25 DIAGNOSIS — M05711 Rheumatoid arthritis with rheumatoid factor of right shoulder without organ or systems involvement: Secondary | ICD-10-CM

## 2020-02-25 DIAGNOSIS — E1169 Type 2 diabetes mellitus with other specified complication: Secondary | ICD-10-CM

## 2020-02-25 DIAGNOSIS — E782 Mixed hyperlipidemia: Secondary | ICD-10-CM

## 2020-02-25 DIAGNOSIS — L309 Dermatitis, unspecified: Secondary | ICD-10-CM

## 2020-02-25 DIAGNOSIS — Z6837 Body mass index (BMI) 37.0-37.9, adult: Secondary | ICD-10-CM

## 2020-02-25 LAB — POCT URINALYSIS DIPSTICK
Bilirubin, UA: NEGATIVE
Blood, UA: NEGATIVE
Glucose, UA: NEGATIVE
Ketones, UA: NEGATIVE
Nitrite, UA: NEGATIVE
Spec Grav, UA: 1.02 (ref 1.010–1.025)
Urobilinogen, UA: 0.2 E.U./dL
pH, UA: 6 (ref 5.0–8.0)

## 2020-02-25 MED ORDER — OMEPRAZOLE 40 MG PO CPDR: 40.0000 mg | DELAYED_RELEASE_CAPSULE | Freq: Two times a day (BID) | ORAL | 3 refills | Status: DC

## 2020-02-25 MED ORDER — NITROFURANTOIN MONOHYD MACRO 100 MG PO CAPS: 100.0000 mg | ORAL_CAPSULE | Freq: Two times a day (BID) | ORAL | 0 refills | Status: DC

## 2020-02-25 MED ORDER — FLUTICASONE PROPIONATE 50 MCG/ACT NA SUSP: 2.0000 | Freq: Every day | NASAL | 6 refills | Status: DC

## 2020-02-25 MED ORDER — PAROXETINE HCL ER 25 MG PO TB24: 25.0000 mg | ORAL_TABLET | Freq: Every day | ORAL | 2 refills | Status: DC

## 2020-02-25 NOTE — Patient Instructions (Signed)
START FLONASE NASAL SPRAY. START PAXIL CR 25 MG ONCE DAILY IN AM.

## 2020-02-26 LAB — HEMOGLOBIN A1C
Est. average glucose Bld gHb Est-mCnc: 120 mg/dL
Hgb A1c MFr Bld: 5.8 % — ABNORMAL HIGH (ref 4.8–5.6)

## 2020-02-26 LAB — LIPID PANEL
Chol/HDL Ratio: 3.1 ratio (ref 0.0–4.4)
Cholesterol, Total: 162 mg/dL (ref 100–199)
HDL: 52 mg/dL (ref >39)
LDL Chol Calc (NIH): 79 mg/dL (ref 0–99)
Triglycerides: 186 mg/dL — ABNORMAL HIGH (ref 0–149)
VLDL Cholesterol Cal: 31 mg/dL (ref 5–40)

## 2020-02-26 LAB — CARDIOVASCULAR RISK ASSESSMENT

## 2020-02-28 DIAGNOSIS — N3000 Acute cystitis without hematuria: Secondary | ICD-10-CM | POA: Diagnosis not present

## 2020-02-29 LAB — URINE CULTURE

## 2020-03-01 DIAGNOSIS — M150 Primary generalized (osteo)arthritis: Secondary | ICD-10-CM | POA: Diagnosis not present

## 2020-03-01 DIAGNOSIS — J849 Interstitial pulmonary disease, unspecified: Secondary | ICD-10-CM | POA: Diagnosis not present

## 2020-03-01 DIAGNOSIS — M0579 Rheumatoid arthritis with rheumatoid factor of multiple sites without organ or systems involvement: Secondary | ICD-10-CM | POA: Diagnosis not present

## 2020-03-01 DIAGNOSIS — Z79899 Other long term (current) drug therapy: Secondary | ICD-10-CM | POA: Diagnosis not present

## 2020-03-01 DIAGNOSIS — Z6834 Body mass index (BMI) 34.0-34.9, adult: Secondary | ICD-10-CM | POA: Diagnosis not present

## 2020-03-01 DIAGNOSIS — R945 Abnormal results of liver function studies: Secondary | ICD-10-CM | POA: Diagnosis not present

## 2020-03-01 DIAGNOSIS — M48061 Spinal stenosis, lumbar region without neurogenic claudication: Secondary | ICD-10-CM | POA: Diagnosis not present

## 2020-03-01 DIAGNOSIS — E669 Obesity, unspecified: Secondary | ICD-10-CM | POA: Diagnosis not present

## 2020-03-03 ENCOUNTER — Telehealth: Payer: Self-pay | Admitting: Family Medicine

## 2020-03-03 NOTE — Progress Notes (Signed)
  Chronic Care Management   Note  03/03/2020 Name: Sherry Burke MRN: [id number] DOB: September 05, 1941  Sherry Burke is a 79 y.o. year old female who is a primary care patient of Cox, Kirsten, MD. I reached out to Edmonia Lynch by phone today in response to a referral sent by Sherry Burke's PCP, Cox, Kirsten, MD.   Sherry Burke was given information about Chronic Care Management services today including:  1. CCM service includes personalized support from designated clinical staff supervised by her physician, including individualized plan of care and coordination with other care providers 2. 24/7 contact phone numbers for assistance for urgent and routine care needs. 3. Service will only be billed when office clinical staff spend 20 minutes or more in a month to coordinate care. 4. Only one practitioner may furnish and bill the service in a calendar month. 5. The patient may stop CCM services at any time (effective at the end of the month) by phone call to the office staff.   Patient agreed to services and verbal consent obtained.  This note is not being shared with the patient for the following reason: To respect privacy (The patient or proxy has requested that the information not be shared). Follow up plan:   Earney Hamburg Upstream Scheduler

## 2020-03-05 ENCOUNTER — Encounter: Payer: Self-pay | Admitting: Family Medicine

## 2020-03-05 DIAGNOSIS — L309 Dermatitis, unspecified: Secondary | ICD-10-CM | POA: Insufficient documentation

## 2020-03-05 DIAGNOSIS — Z6838 Body mass index (BMI) 38.0-38.9, adult: Secondary | ICD-10-CM | POA: Insufficient documentation

## 2020-03-05 DIAGNOSIS — N3000 Acute cystitis without hematuria: Secondary | ICD-10-CM | POA: Insufficient documentation

## 2020-03-07 ENCOUNTER — Other Ambulatory Visit (HOSPITAL_COMMUNITY)
Admission: RE | Admit: 2020-03-07 | Discharge: 2020-03-07 | Disposition: A | Discharge status: Home / Self Care | Payer: Medicare Other | Source: Ambulatory Visit | Attending: Pulmonary Disease | Admitting: Pulmonary Disease

## 2020-03-07 DIAGNOSIS — Z01812 Encounter for preprocedural laboratory examination: Secondary | ICD-10-CM | POA: Insufficient documentation

## 2020-03-07 DIAGNOSIS — Z20822 Contact with and (suspected) exposure to covid-19: Secondary | ICD-10-CM | POA: Diagnosis not present

## 2020-03-07 LAB — SARS CORONAVIRUS 2 (TAT 6-24 HRS): SARS Coronavirus 2: NEGATIVE

## 2020-03-10 ENCOUNTER — Ambulatory Visit (INDEPENDENT_AMBULATORY_CARE_PROVIDER_SITE_OTHER): Payer: Medicare Other | Admitting: Critical Care Medicine

## 2020-03-10 ENCOUNTER — Ambulatory Visit: Payer: Medicare Other | Admitting: Adult Health

## 2020-03-10 ENCOUNTER — Ambulatory Visit (INDEPENDENT_AMBULATORY_CARE_PROVIDER_SITE_OTHER): Payer: Medicare Other | Admitting: Primary Care

## 2020-03-10 ENCOUNTER — Other Ambulatory Visit: Payer: Self-pay

## 2020-03-10 ENCOUNTER — Encounter: Payer: Self-pay | Admitting: Primary Care

## 2020-03-10 ENCOUNTER — Ambulatory Visit: Payer: Medicare Other | Admitting: Pulmonary Disease

## 2020-03-10 VITALS — BP 134/60 | HR 78 | Temp 98.7°F | Ht 60.0 in | Wt 191.0 lb

## 2020-03-10 DIAGNOSIS — M05712 Rheumatoid arthritis with rheumatoid factor of left shoulder without organ or systems involvement: Secondary | ICD-10-CM

## 2020-03-10 DIAGNOSIS — J849 Interstitial pulmonary disease, unspecified: Secondary | ICD-10-CM | POA: Diagnosis not present

## 2020-03-10 DIAGNOSIS — J449 Chronic obstructive pulmonary disease, unspecified: Secondary | ICD-10-CM

## 2020-03-10 DIAGNOSIS — D849 Immunodeficiency, unspecified: Secondary | ICD-10-CM

## 2020-03-10 DIAGNOSIS — M05711 Rheumatoid arthritis with rheumatoid factor of right shoulder without organ or systems involvement: Secondary | ICD-10-CM

## 2020-03-10 DIAGNOSIS — M0579 Rheumatoid arthritis with rheumatoid factor of multiple sites without organ or systems involvement: Secondary | ICD-10-CM

## 2020-03-10 LAB — PULMONARY FUNCTION TEST
DL/VA % pred: 98 %
DL/VA: 4.13 ml/min/mmHg/L
DLCO cor % pred: 71 %
DLCO cor: 11.82 ml/min/mmHg
DLCO unc % pred: 71 %
DLCO unc: 11.82 ml/min/mmHg
FEF 25-75 Post: 0.62 L/sec
FEF 25-75 Pre: 0.56 L/sec
FEF2575-%Change-Post: 10 %
FEF2575-%Pred-Post: 48 %
FEF2575-%Pred-Pre: 43 %
FEV1-%Change-Post: 3 %
FEV1-%Pred-Post: 65 %
FEV1-%Pred-Pre: 62 %
FEV1-Post: 1.07 L
FEV1-Pre: 1.03 L
FEV1FVC-%Change-Post: 1 %
FEV1FVC-%Pred-Pre: 88 %
FEV6-%Change-Post: 1 %
FEV6-%Pred-Post: 75 %
FEV6-%Pred-Pre: 74 %
FEV6-Post: 1.58 L
FEV6-Pre: 1.56 L
FEV6FVC-%Change-Post: 0 %
FEV6FVC-%Pred-Post: 105 %
FEV6FVC-%Pred-Pre: 106 %
FVC-%Change-Post: 2 %
FVC-%Pred-Post: 71 %
FVC-%Pred-Pre: 70 %
FVC-Post: 1.6 L
FVC-Pre: 1.56 L
Post FEV1/FVC ratio: 67 %
Post FEV6/FVC ratio: 99 %
Pre FEV1/FVC ratio: 66 %
Pre FEV6/FVC Ratio: 100 %
RV % pred: 109 %
RV: 2.35 L
TLC % pred: 94 %
TLC: 4.23 L

## 2020-03-10 MED ORDER — SPIRIVA RESPIMAT 2.5 MCG/ACT IN AERS: 2.0000 | INHALATION_SPRAY | Freq: Every day | RESPIRATORY_TRACT | 0 refills | Status: DC

## 2020-03-10 MED ORDER — SPIRIVA RESPIMAT 2.5 MCG/ACT IN AERS: 2.0000 | INHALATION_SPRAY | Freq: Every day | RESPIRATORY_TRACT | 3 refills | Status: DC

## 2020-03-10 NOTE — Progress Notes (Signed)
PFT done today. 

## 2020-03-10 NOTE — Progress Notes (Signed)
@Patient  ID: Sherry Burke, female    DOB: 1941/10/03, 79 y.o.   MRN: [id number]  Chief Complaint  Patient presents with  . Follow-up    PFT done     Referring provider: [name], MD  HPI: 79 year old female, never smoked (passive exposure). PMH significant for COPD, OSA, hypertension, GERD, rheumatoid arthritis, hyperlipidemia. Patient of Dr. Carlis Abbott, last seen on 09/02/19.   Previous [facility] pulmonary encounters: 09/02/19- Consult, Dr. Carlis Abbott  Mrs. Flippen is a 79 year old woman with a history of difficult to control rheumatoid arthritis and COPD who presents at the request of her rheumatologist to evaluate if she is an appropriate candidate for methotrexate.  She is accompanied with her son.  She was on methotrexate for 8 years in the past, which was stopped about 19 years ago.  At that time she presented to the ED with a cough shortness of breath fever, feeling sick, was told in the ED that she had lung scarring from methotrexate.  Recently she has tried multiple other medications which have been ineffective at treating her RA.  She is about to start a new medication, but her Rheumatologist Dr. Lenna Gilford would like to know if MTX is an option to use again. She has been seen in the past for breathing difficulties attributed to asthma or another unknown process that has led to fixed obstructive lung disease.  Her breathing has not caused her difficulty recently.  She denies shortness of breath, cough, and wheezing.  She has not used an inhaler in a long time, but she has albuterol available if needed.  She has chronic GERD which is well controlled with omeprazole.  She has never smoked or vaped.  Her recent past medical history is notable for urinary tract infections, which she gets about 1 year.  In July 2020 she was hospitalized for a week at [facility] for severe UTI.  She was discharged to rehab, and has since returned home.  Her family thinks that she developed a UTI from her immobility from her  rheumatoid arthritis-especially related to bilateral knee pain.  She was prescribed nitrofurantoin a few weeks ago for urinary tract infection, but in the past has taken other antibiotics.   03/10/2020 Patient presents today for 6 month follow-up with PFTs. Accompanied by her son. States that she is not tolerating Xeljanz for her RA d/t nausea. She does not have any joint pain. She was taking 10mg  daily, this was decreased to 5mg  a month ago. Reports that she tolerated methotrexate better and is wanting clearance from pulmonary to resume medication if needed. She has been off of methotrexate for 10-11 years.  She can not stand for very long without becoming short of breath/tired. Reports that she is always clearing her throat and coughing. Not currently taking Dulera. She does not use her rescue inhaler often but her son feels she could could probably use it.    Pulmonary function testing: 03/10/2020 - FVC 1.60 (71%), FEV1 1.07 (65%), ratio 67, TLC 94%, DLCOcor 11.82 (71%). No BD response   04/10/2011: FVC 2.64 (92%)--> 2.63 (92%, 0% change) FEV1 1.55 L (76%) --> 1.57 L (77%, 1% change) Ratio 59 RV 1.8 (94%)  TLC 4.44 (92%) via nitrogen washout DLCO 20.3 (83%)  flow volume loop consistent with obstruction -Mild obstruction, no significant restriction or diffusion impairment   Imaging: HRCT 09/08/19- Pulmonary parenchymal pattern of predominantly peripheral ground-glass, reticular densities and bronchiolectasis is unchanged from 04/29/2015 and may represent nonspecific interstitial pneumonitis. Given micro nodularity  in the right upper lobe and along the fissures, the possibility of sarcoid is not excluded. Findings are indeterminate for UIP   Allergies  Allergen Reactions  . Sulfa Antibiotics Nausea And Vomiting  . Actemra [Tocilizumab]   . Amlodipine   . Celebrex [Celecoxib]   . Methotrexate Derivatives Other (See Comments)    sweating  . Niaspan [Niacin]     Severe flushing  .  Remicade [Infliximab] Nausea And Vomiting and Other (See Comments)    Affects nervous system  . Welchol [Colesevelam] Other (See Comments)    Abdominal pain and headache    Immunization History  Administered Date(s) Administered  . Influenza Whole 08/12/2011  . Influenza, High Dose Seasonal PF 08/12/2019    Past Medical History:  Diagnosis Date  . Acute renal insufficiency   . Asthma   . Chronic bronchitis   . COPD (chronic obstructive pulmonary disease) ([place])   . Depression   . History of migraine headaches   . Hyperlipemia   . OSA (obstructive sleep apnea)    pt states she doesn't have it  . Osteoarthritis   . Pneumonia   . Rheumatoid arthritis(714.0)   . RLS (restless legs syndrome)     Tobacco History: Social History   Tobacco Use  Smoking Status Passive Smoke Exposure - Never Smoker  Smokeless Tobacco Never Used   Counseling given: Not Answered   Outpatient Medications Prior to Visit  Medication Sig Dispense Refill  . albuterol (VENTOLIN HFA) 108 (90 BASE) MCG/ACT inhaler Inhale 1-2 puffs into the lungs every 4 (four) hours as needed for shortness of breath.     Marland Kitchen atorvastatin (LIPITOR) 40 MG tablet     . cloNIDine (CATAPRES) 0.1 MG tablet Take 1 tablet by mouth twice daily 180 tablet 0  . diphenhydramine-acetaminophen (TYLENOL PM) 25-500 MG TABS tablet Take 1 tablet by mouth at bedtime as needed.    Marland Kitchen estradiol (ESTRACE VAGINAL) 0.1 MG/GM vaginal cream     . fluticasone (FLONASE) 50 MCG/ACT nasal spray Place 2 sprays into both nostrils daily. 16 g 6  . furosemide (LASIX) 40 MG tablet Take 40 mg by mouth daily.    Marland Kitchen gabapentin (NEURONTIN) 300 MG capsule Take 1 capsule (300 mg total) by mouth 2 (two) times daily for 180 doses. 180 capsule 1  . HYDROcodone-acetaminophen (NORCO/VICODIN) 5-325 MG tablet Take 1 tablet by mouth every 6 (six) hours as needed for moderate pain. 120 tablet 0  . hydroxychloroquine (PLAQUENIL) 200 MG tablet Take 1 tablet (200 mg total) by  mouth 2 (two) times daily. 60 tablet 2  . lisinopril (ZESTRIL) 10 MG tablet Take 1 tablet by mouth once daily 90 tablet 0  . nitrofurantoin, macrocrystal-monohydrate, (MACROBID) 100 MG capsule Take 1 capsule (100 mg total) by mouth 2 (two) times daily. 14 capsule 0  . omeprazole (PRILOSEC) 40 MG capsule Take 1 capsule (40 mg total) by mouth 2 (two) times daily. 180 capsule 3  . PARoxetine (PAXIL CR) 25 MG 24 hr tablet Take 1 tablet (25 mg total) by mouth daily. 30 tablet 2  . polyethylene glycol (MIRALAX / GLYCOLAX) 17 g packet Take 17 g by mouth daily. Use daily as needed for constipation    . rOPINIRole (REQUIP) 1 MG tablet Take 1 mg by mouth 2 (two) times daily. Take 1 tablet every morning and 2 at bedtime    . spironolactone (ALDACTONE) 25 MG tablet     . Tofacitinib Citrate (XELJANZ) 5 MG TABS Take 5 mg by mouth  in the morning and at bedtime.    . Vitamin D, Ergocalciferol, (DRISDOL) 1.25 MG (50000 UNIT) CAPS capsule Take 1 capsule by mouth once a week 12 capsule 0  . mometasone-formoterol (DULERA) 200-5 MCG/ACT AERO Inhale into the lungs.     No facility-administered medications prior to visit.      Review of Systems  Review of Systems  Respiratory: Positive for cough and shortness of breath.    Physical Exam  BP 134/60 (BP Location: Left Arm, Cuff Size: Normal)   Pulse 78   Temp 98.7 F (37.1 C) (Temporal)   Ht 5' (1.524 m)   Wt 191 lb (86.6 kg)   SpO2 96%   BMI 37.30 kg/m  Physical Exam Constitutional:      Appearance: Normal appearance.  Cardiovascular:     Rate and Rhythm: Normal rate.  Pulmonary:     Effort: Pulmonary effort is normal.     Breath sounds: Rales present.  Skin:    General: Skin is warm and dry.  Neurological:     General: No focal deficit present.     Mental Status: She is alert and oriented to person, place, and time. Mental status is at baseline.  Psychiatric:        Mood and Affect: Mood normal.        Behavior: Behavior normal.         Thought Content: Thought content normal.        Judgment: Judgment normal.      Lab Results:  CBC    Component Value Date/Time   WBC 7.6 08/23/2013 1829   RBC 4.50 08/23/2013 1829   HGB 12.9 08/23/2013 1829   HCT 39.5 08/23/2013 1829   PLT 161 08/23/2013 1829   MCV 87.8 08/23/2013 1829   MCH 28.7 08/23/2013 1829   MCHC 32.7 08/23/2013 1829   RDW 14.2 08/23/2013 1829   LYMPHSABS 1.5 08/23/2013 1829   MONOABS 0.6 08/23/2013 1829   EOSABS 0.3 08/23/2013 1829   BASOSABS 0.1 08/23/2013 1829    BMET    Component Value Date/Time   NA 144 08/23/2013 1829   K 4.6 08/23/2013 1829   CL 106 08/23/2013 1829   CO2 26 08/23/2013 1829   GLUCOSE 90 08/23/2013 1829   BUN 24 (H) 08/23/2013 1829   CREATININE 0.77 08/23/2013 1829   CALCIUM 9.3 08/23/2013 1829   GFRNONAA 83 (L) 08/23/2013 1829   GFRAA >90 08/23/2013 1829    BNP No results found for: BNP  ProBNP No results found for: PROBNP  Imaging: No results found.   Assessment & Plan:   Rheumatoid arthritis involving both shoulders with positive rheumatoid factor ([place]) - Referred By Dr. Lenna Gilford to see if MXT would be an option to use again. Currently on Xeljanz but not tolerating d/t nausea. No current joint pain.   COPD (chronic obstructive pulmonary disease) - Reports becoming easily fatigue and short of breath with standing - No benefit from [facility] - Start Spiriva respimat 2.87mcg - take two puffs once daily in the morning - FU in 4 weeks  03/10/2020 - FVC 1.60 (71%), FEV1 1.07 (65%), ratio 67, TLC 94%, DLCOcor 11.82 (71%).    ILD (interstitial lung disease) (HCC) - HRCT felt to be more indicative of progressive RA-ILD and less likely due to MTX-lung toxicity. MXT would be ok to resume and helpful to preventing progression of ILD/arthritis.   Martyn Ehrich, NP 03/11/2020

## 2020-03-10 NOTE — Patient Instructions (Addendum)
Pulmonary function testing consistent with COPD. No evidence of asthma. Will start an inhaler called Spiriva which is used of obstructive lung disease. Will discuss with Dr. Carlis Abbott about repeating CT scan and possibility of restarting methotrexate. Review chart- ok to restart.   Recommendations: Start Spiriva respimat- take two puffs once daily in the morning  Follow-up: 4 weeks televisit with Dr. Carlis Abbott or Beth     Chronic Obstructive Pulmonary Disease Chronic obstructive pulmonary disease (COPD) is a long-term (chronic) lung problem. When you have COPD, it is hard for air to get in and out of your lungs. Usually the condition gets worse over time, and your lungs will never return to normal. There are things you can do to keep yourself as healthy as possible.  Your doctor may treat your condition with: ? Medicines. ? Oxygen. ? Lung surgery.  Your doctor may also recommend: ? Rehabilitation. This includes steps to make your body work better. It may involve a team of specialists. ? Quitting smoking, if you smoke. ? Exercise and changes to your diet. ? Comfort measures (palliative care). Follow these instructions at home: Medicines  Take over-the-counter and prescription medicines only as told by your doctor.  Talk to your doctor before taking any cough or allergy medicines. You may need to avoid medicines that cause your lungs to be dry. Lifestyle  If you smoke, stop. Smoking makes the problem worse. If you need help quitting, ask your doctor.  Avoid being around things that make your breathing worse. This may include smoke, chemicals, and fumes.  Stay active, but remember to rest as well.  Learn and use tips on how to relax.  Make sure you get enough sleep. Most adults need at least 7 hours of sleep every night.  Eat healthy foods. Eat smaller meals more often. Rest before meals. Controlled breathing Learn and use tips on how to control your breathing as told by your doctor.  Try:  Breathing in (inhaling) through your nose for 1 second. Then, pucker your lips and breath out (exhale) through your lips for 2 seconds.  Putting one hand on your belly (abdomen). Breathe in slowly through your nose for 1 second. Your hand on your belly should move out. Pucker your lips and breathe out slowly through your lips. Your hand on your belly should move in as you breathe out.  Controlled coughing Learn and use controlled coughing to clear mucus from your lungs. Follow these steps: 1. Lean your head a little forward. 2. Breathe in deeply. 3. Try to hold your breath for 3 seconds. 4. Keep your mouth slightly open while coughing 2 times. 5. Spit any mucus out into a tissue. 6. Rest and do the steps again 1 or 2 times as needed. General instructions  Make sure you get all the shots (vaccines) that your doctor recommends. Ask your doctor about a flu shot and a pneumonia shot.  Use oxygen therapy and pulmonary rehabilitation if told by your doctor. If you need home oxygen therapy, ask your doctor if you should buy a tool to measure your oxygen level (oximeter).  Make a COPD action plan with your doctor. This helps you to know what to do if you feel worse than usual.  Manage any other conditions you have as told by your doctor.  Avoid going outside when it is very hot, cold, or humid.  Avoid people who have a sickness you can catch (contagious).  Keep all follow-up visits as told by your doctor. This  is important. Contact a doctor if:  You cough up more mucus than usual.  There is a change in the color or thickness of the mucus.  It is harder to breathe than usual.  Your breathing is faster than usual.  You have trouble sleeping.  You need to use your medicines more often than usual.  You have trouble doing your normal activities such as getting dressed or walking around the house. Get help right away if:  You have shortness of breath while resting.  You have  shortness of breath that stops you from: ? Being able to talk. ? Doing normal activities.  Your chest hurts for longer than 5 minutes.  Your skin color is more blue than usual.  Your pulse oximeter shows that you have low oxygen for longer than 5 minutes.  You have a fever.  You feel too tired to breathe normally. Summary  Chronic obstructive pulmonary disease (COPD) is a long-term lung problem.  The way your lungs work will never return to normal. Usually the condition gets worse over time. There are things you can do to keep yourself as healthy as possible.  Take over-the-counter and prescription medicines only as told by your doctor.  If you smoke, stop. Smoking makes the problem worse. This information is not intended to replace advice given to you by your health care provider. Make sure you discuss any questions you have with your health care provider. Document Revised: 10/10/2017 Document Reviewed: 12/02/2016 Elsevier Patient Education  2020 [employer].

## 2020-03-11 ENCOUNTER — Telehealth: Payer: Self-pay | Admitting: Primary Care

## 2020-03-11 DIAGNOSIS — J849 Interstitial pulmonary disease, unspecified: Secondary | ICD-10-CM | POA: Insufficient documentation

## 2020-03-11 NOTE — Assessment & Plan Note (Signed)
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 MXT would be ok to resume and helpful to preventing progression of ILD/arthritis.

## 2020-03-11 NOTE — Assessment & Plan Note (Addendum)
-   Referred By Dr. Lenna Gilford to see if MXT would be an option to use again. Currently on Xeljanz but not tolerating d/t nausea. No current joint pain.

## 2020-03-11 NOTE — Telephone Encounter (Signed)
error 

## 2020-03-11 NOTE — Assessment & Plan Note (Signed)
-   Reports becoming easily fatigue and short of breath with standing - No benefit from [facility] - Start Spiriva respimat 2.34mcg - take two puffs once daily in the morning - FU in 4 weeks  03/10/2020 - FVC 1.60 (71%), FEV1 1.07 (65%), ratio 67, TLC 94%, DLCOcor 11.82 (71%).

## 2020-03-20 DIAGNOSIS — M1712 Unilateral primary osteoarthritis, left knee: Secondary | ICD-10-CM | POA: Diagnosis not present

## 2020-03-22 ENCOUNTER — Other Ambulatory Visit: Payer: Self-pay

## 2020-03-23 MED ORDER — HYDROCODONE-ACETAMINOPHEN 5-325 MG PO TABS: 1.0000 | ORAL_TABLET | Freq: Four times a day (QID) | ORAL | 0 refills | Status: DC | PRN

## 2020-03-29 NOTE — Chronic Care Management (AMB) (Signed)
Chronic Care Management Pharmacy  Name: Renna Kilmer  MRN: [id number] DOB: [birth year]  Chief Complaint/ HPI  Sherry Burke,  79 y.o. , female presents for their Initial CCM visit with the clinical pharmacist via telephone due to COVID-19 Pandemic.  PCP : [name], MD  Their chronic conditions include: HTN, COPD, GERD, HLD, Rheumatoid arthritis, RLS, Depression. Office Visits: 02/25/2020 - Start paxil cr for worsening depression. Macrobid rx given for acute cystitis.  12/27/2019 - macrodantin for acute cystitis without hematuria. Consult Visit: 03/10/2020 - referred by Dr. Lenna Gilford to get clearance to resume methotrexate. Patient experiencing nausea with [name]. No benefit from [facility] so changing to Spiriva respimat 2.5 mcg. 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 Methotrexate would be ok to resume and helpful to preventing progression of ILD/arthritis. 02/13/2020 - Hospitalization for atypical chest pain/dyspepsia.Increased PPI to twice daily.      Medications: Outpatient Encounter Medications as of 03/30/2020  Medication Sig  . albuterol (VENTOLIN HFA) 108 (90 BASE) MCG/ACT inhaler Inhale 1-2 puffs into the lungs every 4 (four) hours as needed for shortness of breath.   Marland Kitchen atorvastatin (LIPITOR) 40 MG tablet Take 40 mg by mouth daily.   . cloNIDine (CATAPRES) 0.1 MG tablet Take 1 tablet by mouth twice daily  . diphenhydramine-acetaminophen (TYLENOL PM) 25-500 MG TABS tablet Take 1 tablet by mouth at bedtime as needed.  . fluticasone (FLONASE) 50 MCG/ACT nasal spray Place 2 sprays into both nostrils daily. (Patient taking differently: Place 2 sprays into both nostrils daily as needed for allergies. )  . furosemide (LASIX) 40 MG tablet Take 40 mg by mouth daily as needed.   . gabapentin (NEURONTIN) 300 MG capsule Take 1 capsule (300 mg total) by mouth 2 (two) times daily for 180 doses.  Marland Kitchen HYDROcodone-acetaminophen  (NORCO/VICODIN) 5-325 MG tablet Take 1 tablet by mouth every 6 (six) hours as needed for moderate pain.  . hydroxychloroquine (PLAQUENIL) 200 MG tablet Take 1 tablet (200 mg total) by mouth 2 (two) times daily.  Marland Kitchen lisinopril (ZESTRIL) 10 MG tablet Take 1 tablet by mouth once daily  . omeprazole (PRILOSEC) 40 MG capsule Take 1 capsule (40 mg total) by mouth 2 (two) times daily.  Marland Kitchen PARoxetine (PAXIL CR) 25 MG 24 hr tablet Take 1 tablet (25 mg total) by mouth daily.  . polyethylene glycol (MIRALAX / GLYCOLAX) 17 g packet Take 17 g by mouth daily. Use daily as needed for constipation  . rOPINIRole (REQUIP) 1 MG tablet Take 1 mg by mouth 2 (two) times daily. Take 1 tablet every morning and 2 at bedtime  . Tiotropium Bromide Monohydrate (SPIRIVA RESPIMAT) 2.5 MCG/ACT AERS Inhale 2 puffs into the lungs daily.  . Tofacitinib Citrate (XELJANZ) 5 MG TABS Take 5 mg by mouth daily.   . Vitamin D, Ergocalciferol, (DRISDOL) 1.25 MG (50000 UNIT) CAPS capsule Take 1 capsule by mouth once a week (Patient taking differently: Take 50,000 Units by mouth every Wednesday. )  . estradiol (ESTRACE VAGINAL) 0.1 MG/GM vaginal cream   . nitrofurantoin, macrocrystal-monohydrate, (MACROBID) 100 MG capsule Take 1 capsule (100 mg total) by mouth 2 (two) times daily. (Patient not taking: Reported on 03/30/2020)  . spironolactone (ALDACTONE) 25 MG tablet    No facility-administered encounter medications on file as of 03/30/2020.   Allergies  Allergen Reactions  . Sulfa Antibiotics Nausea And Vomiting  . Actemra [Tocilizumab]   . Amlodipine   . Celebrex [Celecoxib]   . Methotrexate  Derivatives Other (See Comments)    sweating  . Niaspan [Niacin]     Severe flushing  . Remicade [Infliximab] Nausea And Vomiting and Other (See Comments)    Affects nervous system  . Welchol [Colesevelam] Other (See Comments)    Abdominal pain and headache   SDOH Screenings   Alcohol Screen: Low Risk   . Last Alcohol Screening Score  (AUDIT): 0  Depression (PHQ2-9): Low Risk   . PHQ-2 Score: 0  Financial Resource Strain: Low Risk   . Difficulty of Paying Living Expenses: Not hard at all  Food Insecurity: No Food Insecurity  . Worried About Charity fundraiser in the Last Year: Never true  . Ran Out of Food in the Last Year: Never true  Housing: Low Risk   . Last Housing Risk Score: 0  Physical Activity: Sufficiently Active  . Days of Exercise per Week: 7 days  . Minutes of Exercise per Session: 60 min  Social Connections: Not Isolated  . Frequency of Communication with Friends and Family: More than three times a week  . Frequency of Social Gatherings with Friends and Family: Once a week  . Attends Religious Services: More than 4 times per year  . Active Member of Clubs or Organizations: Yes  . Attends Archivist Meetings: 1 to 4 times per year  . Marital Status: Married  Stress: No Stress Concern Present  . Feeling of Stress : Only a little  Tobacco Use: Medium Risk  . Smoking Tobacco Use: Passive Smoke Exposure - Never Smoker  . Smokeless Tobacco Use: Never Used  Transportation Needs: No Transportation Needs  . Lack of Transportation (Medical): No  . Lack of Transportation (Non-Medical): No     Current Diagnosis/Assessment:  Goals Addressed            This Visit's Progress   . Pharmacy Care Plan       CARE PLAN ENTRY  Current Barriers:  . Chronic Disease Management support, education, and care coordination needs related to Hypertension and Hyperlipidemia   Hypertension . Pharmacist Clinical Goal(s): o Over the next 90 days, patient will work with PharmD and providers to maintain BP goal <140/90 . Current regimen:  o Lisinopril 10 mg daily, Furosemide 40 mg daily prn, Spironolactone 25 mg, Clonidine 0.1 mg bid . Interventions: o Recommend patient begin checking bp weekly to monitor.  . Patient self care activities - Over the next 90 days, patient will: o Check BP weekly, document,  and provide at future appointments o Ensure daily salt intake < 2300 mg/day  Hyperlipidemia . Pharmacist Clinical Goal(s): o Over the next 90 days, patient will work with PharmD and providers to maintain LDL goal < 100 . Current regimen:  o Atorvastatin 40 mg daily.  . Interventions: o Recommend patient consume a low-fat, heart healthy diet.  . Patient self care activities - Over the next 90 days, patient will: o Continue to take medications daily as prescribed. Be mindful of fat in diet each ay.  GERD . Pharmacist Clinical Goal(s) o Over the next 90 days, patient will work with PharmD and providers to trial reducing dose of omeprazole back to once daily.  . Current regimen:  o Omeprazole 40 mg bid . Interventions: o Recommend patient begin taking once daily every other day and monitor for symptoms. If tolerated, continue to further reduce dose to daily.  . Patient self care activities - Over the next 90 days, patient will: o Work to reduce dose  of omeprazole to once daily.   Medication management . Pharmacist Clinical Goal(s): o Over the next 90 days, patient will work with PharmD and providers to maintain optimal medication adherence . Current pharmacy: [facility] . Interventions o Comprehensive medication review performed. o Continue current medication management strategy . Patient self care activities - Over the next 90 days, patient will: o Focus on medication adherence by continuing to use pill box.  o Take medications as prescribed o Report any questions or concerns to PharmD and/or provider(s)  Initial goal documentation        COPD / Asthma / Tobacco   Last spirometry score: FVC 1.60 (71%), FEV1 1.07 (65%), ratio 67, TLC 94%, DLCOcor 11.82 (71%).  Gold Grade: Gold 2 (FEV1 50-79%)  Eosinophil count:   Lab Results  Component Value Date/Time   EOSPCT 4 08/23/2013 06:29 PM  %                               Eos (Absolute):  Lab Results  Component Value  Date/Time   EOSABS 0.3 08/23/2013 06:29 PM    Tobacco Status:  Social History   Tobacco Use  Smoking Status Passive Smoke Exposure - Never Smoker  Smokeless Tobacco Never Used    Patient has failed these meds in past: advair, dulera,  Patient is currently controlled on the following medications: flonase 2 sprays each nostril daily prn, Spiriva 2.5 mcg/act 2 puffs daily, albuterol prn Using maintenance inhaler regularly? Yes Frequency of rescue inhaler use:  infrequently  We discussed: Patient becomes SOB and tight in chest at times. Had not been using rescue inhaler because misunderstood directions to use both inhalers. Has had lung problems since she was a child. She stays indoors to avoid outdoor allergen triggers.   Plan  Continue current medications   ,  Hypertension   BP today is:  <140/90  Office blood pressures are  BP Readings from Last 3 Encounters:  03/10/20 134/60  02/25/20 138/72  12/27/19 136/90    Patient has failed these meds in the past: amlodipine 5 mg daily Patient is currently controlled on the following medications: lisinopril 10 mg daily, furosemide 40 mg daily prn, spironolactone 25 mg, clonidine 0.1 mg bid.  Patient checks BP at home several times per month  Patient home BP readings are ranging: 130/68 mmHg  We discussed diet and exercise extensively. Enjoys eggs over easy and toast for breakfast. Eats cereals or toast with peanut butter. Eats peanut butter sandwiches or tuna for lunch. Occasionally will go out for meals. Loves fruit - oranges, bananas, pears and peaches. Enjoys cottage cheese. Has stopped taking her furosemide daily and uses it prn for ankle swelling. Patient wears support hose some to control ankle swelling. She said sitting so much due to knee and back pain causes the swelling to worsen.    Plan  Continue current medications   Hyperlipidemia   Lipid Panel     Component Value Date/Time   CHOL 162 02/25/2020 0943   TRIG 186  (H) 02/25/2020 0943   HDL 52 02/25/2020 0943   CHOLHDL 3.1 02/25/2020 0943   LDLCALC 79 02/25/2020 0943   LABVLDL 31 02/25/2020 0943     The ASCVD Risk score (Goff DC Jr., et al., 2013) failed to calculate for the following reasons:   Unable to determine if patient is Non-Hispanic African American   Patient has failed these meds in past: ezetemibe 10 mg  daily Patient is currently uncontrolled on the following medications: atorvastatin 40 mg daily   We discussed:  diet and exercise extensively. Recently ate fresh collard greens but her digestive tract didn't tolerate it well.   Plan  Continue current medications  Osteopenia / Osteoporosis   Last DEXA Scan: 11/23/2017  T-Score femoral neck: -1.1  10-year probability of major osteoporotic fracture: 9.6% 10-year probability of hip fracture: 1.5%  No results found for: VD25OH   Patient is not a candidate for pharmacologic treatment  Patient has failed these meds in past: n/a Patient is currently controlled on the following medications: Vitamin D 50,000 unit weekly on Wednesdays  We discussed:  Recommend [phone] units of vitamin D daily. Recommend 1200 mg of calcium daily from dietary and supplemental sources. Patent didn't realize she needed to take Calcium with D on top of Vitamin D 50,000 unit. Patient denies getting a lot of dairy or calcium in her diet regularly. She states she will begin taking Calcium and D twice daily.   Plan  Continue current medications and add Calcium with D twice daily with food.      and  Other Diagnosis:GERD   Patient has failed these meds in past: pantoprazole 40 mg  Patient is currently controlled on the following medications: omeprazole 40 mg bid  We discussed:  Patient was increased to twice daily omeprazole. She was having lots of nausea and symptoms while taking Xeljanz bid. Pharmacist discussed doing a trial of slowly tapering omeprazole dose to once daily.   Plan  Trial tapering  omeprazole dose to once daily and monitor for any GI side effects.     Rheumatoid Arthritis/Pain   Patient has failed these meds in past: methotrexate, lefluonamide, morphine, nabumetone Patient is currently controlled on the following medications: hydroxychloroquine 200 mg bid, xeljanz 5 mg daily, hydrocodone-apap 5-325 mg q6h prn pain.  We discussed:  Patient had to stop methotrexate due to potential of scar tissue in lungs. Now they think it is RA related not MTX related. Patient recently went to pulmonologist to be cleared if needs to resumed methotrexate therapy. Takes hydrocodone 1-2 times per week for severe pain. Xeljanz bid dosing was making patient very sick. She now takes it once daily and GI intolerance is improved and joint pain/stiffness is controlled.   Plan  Continue current medications  Depression   Patient has failed these meds in past: paxil Patient is currently controlled on the following medications: Paroxetine CR 25 mg daily  We discussed:  Patient reports good control of symptoms.   Plan  Continue current medications   Restless Leg Syndrome   Patient has failed these meds in past: n/a Patient is currently controlled on the following medications: ropinirole 1 mg qam and 2 mg qhs, gabapentin 300 mg bid  We discussed:  Patient had terrible restless leg before beginning gabapentin. Ropinirole alone helped some but gabapentin made the biggest difference when added to ropinirole.    Plan  Continue current medications    Health Maintenance   Patient is currently controlled on the following medications:  Tylenol pm qhs prn - sleep Miralax prn - constipation Vitamin D 50,000 weekly - low vitamin D  We discussed:  Miralax leaves her bowels cleared out well but leaves her with a residual queasy feeling if takes more than 1 dose. Recommended trying 1/2 a dose to see if it works to .   Plan  Continue current medications  Vaccines   Reviewed and discussed  patient's vaccination  history.    Immunization History  Administered Date(s) Administered  . Influenza Whole 08/12/2011  . Influenza, High Dose Seasonal PF 08/12/2019    Plan  Recommended patient receive annual flu vaccine in office.  Medication Management   Pt uses Mail order pharmacy for all medications Uses pill box? Yes Pt endorses 100% compliance  We discussed: Patient uses pill box to organize medications.   Plan  Continue current medication management strategy    Follow up: 1 month phone visit

## 2020-03-30 ENCOUNTER — Ambulatory Visit: Payer: Medicare Other

## 2020-03-30 DIAGNOSIS — I10 Essential (primary) hypertension: Secondary | ICD-10-CM

## 2020-03-30 DIAGNOSIS — E782 Mixed hyperlipidemia: Secondary | ICD-10-CM

## 2020-04-02 NOTE — Patient Instructions (Addendum)
Visit Information  Thank you for your time discussing your medications. I look forward to working with you to achieve your health care goals. Below is a summary of what we talked about during our visit.   Goals Addressed            This Visit's Progress   . Pharmacy Care Plan       CARE PLAN ENTRY  Current Barriers:  . Chronic Disease Management support, education, and care coordination needs related to Hypertension and Hyperlipidemia   Hypertension . Pharmacist Clinical Goal(s): o Over the next 90 days, patient will work with PharmD and providers to maintain BP goal <140/90 . Current regimen:  o Lisinopril 10 mg daily, Furosemide 40 mg daily prn, Spironolactone 25 mg, Clonidine 0.1 mg bid . Interventions: o Recommend patient begin checking bp weekly to monitor.  . Patient self care activities - Over the next 90 days, patient will: o Check BP weekly, document, and provide at future appointments o Ensure daily salt intake < 2300 mg/day  Hyperlipidemia . Pharmacist Clinical Goal(s): o Over the next 90 days, patient will work with PharmD and providers to maintain LDL goal < 100 . Current regimen:  o Atorvastatin 40 mg daily.  . Interventions: o Recommend patient consume a low-fat, heart healthy diet.  . Patient self care activities - Over the next 90 days, patient will: o Continue to take medications daily as prescribed. Be mindful of fat in diet each ay.  GERD . Pharmacist Clinical Goal(s) o Over the next 90 days, patient will work with PharmD and providers to trial reducing dose of omeprazole back to once daily.  . Current regimen:  o Omeprazole 40 mg bid . Interventions: o Recommend patient begin taking once daily every other day and monitor for symptoms. If tolerated, continue to further reduce dose to daily.  . Patient self care activities - Over the next 90 days, patient will: o Work to reduce dose of omeprazole to once daily.   Medication management . Pharmacist  Clinical Goal(s): o Over the next 90 days, patient will work with PharmD and providers to maintain optimal medication adherence . Current pharmacy: [facility] . Interventions o Comprehensive medication review performed. o Continue current medication management strategy . Patient self care activities - Over the next 90 days, patient will: o Focus on medication adherence by continuing to use pill box.  o Take medications as prescribed o Report any questions or concerns to PharmD and/or provider(s)  Initial goal documentation        Sherry Burke was given information about Chronic Care Management services today including:  1. CCM service includes personalized support from designated clinical staff supervised by her physician, including individualized plan of care and coordination with other care providers 2. 24/7 contact phone numbers for assistance for urgent and routine care needs. 3. Standard insurance, coinsurance, copays and deductibles apply for chronic care management only during months in which we provide at least 20 minutes of these services. Most insurances cover these services at 100%, however patients may be responsible for any copay, coinsurance and/or deductible if applicable. This service may help you avoid the need for more expensive face-to-face services. 4. Only one practitioner may furnish and bill the service in a calendar month. 5. The patient may stop CCM services at any time (effective at the end of the month) by phone call to the office staff.  Patient agreed to services and verbal consent obtained.   The patient verbalized understanding  of instructions provided today and agreed to receive a mailed copy of patient instruction and/or educational materials. Telephone follow up appointment with pharmacy team member scheduled FOY:DXAJ 2021  Sherre Poot, PharmD Clinical Pharmacist [place] [phone] (office) [phone] (mobile)   DASH  Eating Plan DASH stands for "Dietary Approaches to Stop Hypertension." The DASH eating plan is a healthy eating plan that has been shown to reduce high blood pressure (hypertension). It may also reduce your risk for type 2 diabetes, heart disease, and stroke. The DASH eating plan may also help with weight loss. What are tips for following this plan?  General guidelines  Avoid eating more than 2,300 mg (milligrams) of salt (sodium) a day. If you have hypertension, you may need to reduce your sodium intake to 1,500 mg a day.  Limit alcohol intake to no more than 1 drink a day for nonpregnant women and 2 drinks a day for men. One drink equals 12 oz of beer, 5 oz of wine, or 1 oz of hard liquor.  Work with your health care provider to maintain a healthy body weight or to lose weight. Ask what an ideal weight is for you.  Get at least 30 minutes of exercise that causes your heart to beat faster (aerobic exercise) most days of the week. Activities may include walking, swimming, or biking.  Work with your health care provider or diet and nutrition specialist (dietitian) to adjust your eating plan to your individual calorie needs. Reading food labels   Check food labels for the amount of sodium per serving. Choose foods with less than 5 percent of the Daily Value of sodium. Generally, foods with less than 300 mg of sodium per serving fit into this eating plan.  To find whole grains, look for the word "whole" as the first word in the ingredient list. Shopping  Buy products labeled as "low-sodium" or "no salt added."  Buy fresh foods. Avoid canned foods and premade or frozen meals. Cooking  Avoid adding salt when cooking. Use salt-free seasonings or herbs instead of table salt or sea salt. Check with your health care provider or pharmacist before using salt substitutes.  Do not fry foods. Cook foods using healthy methods such as baking, boiling, grilling, and broiling instead.  Cook with  heart-healthy oils, such as olive, canola, soybean, or sunflower oil. Meal planning  Eat a balanced diet that includes: ? 5 or more servings of fruits and vegetables each day. At each meal, try to fill half of your plate with fruits and vegetables. ? Up to 6-8 servings of whole grains each day. ? Less than 6 oz of lean meat, poultry, or fish each day. A 3-oz serving of meat is about the same size as a deck of cards. One egg equals 1 oz. ? 2 servings of low-fat dairy each day. ? A serving of nuts, seeds, or beans 5 times each week. ? Heart-healthy fats. Healthy fats called Omega-3 fatty acids are found in foods such as flaxseeds and coldwater fish, like sardines, salmon, and mackerel.  Limit how much you eat of the following: ? Canned or prepackaged foods. ? Food that is high in trans fat, such as fried foods. ? Food that is high in saturated fat, such as fatty meat. ? Sweets, desserts, sugary drinks, and other foods with added sugar. ? Full-fat dairy products.  Do not salt foods before eating.  Try to eat at least 2 vegetarian meals each week.  Eat more home-cooked food  and less restaurant, buffet, and fast food.  When eating at a restaurant, ask that your food be prepared with less salt or no salt, if possible. What foods are recommended? The items listed may not be a complete list. Talk with your dietitian about what dietary choices are best for you. Grains Whole-grain or whole-wheat bread. Whole-grain or whole-wheat pasta. Joyce Leckey rice. [place] Morrow. Bulgur. Whole-grain and low-sodium cereals. Pita bread. Low-fat, low-sodium crackers. Whole-wheat flour tortillas. Vegetables Fresh or frozen vegetables (raw, steamed, roasted, or grilled). Low-sodium or reduced-sodium tomato and vegetable juice. Low-sodium or reduced-sodium tomato sauce and tomato paste. Low-sodium or reduced-sodium canned vegetables. Fruits All fresh, dried, or frozen fruit. Canned fruit in natural juice (without  added sugar). Meat and other protein foods Skinless chicken or [place]. Ground chicken or [place]. Pork with fat trimmed off. Fish and seafood. Egg whites. Dried beans, peas, or lentils. Unsalted nuts, nut butters, and seeds. Unsalted canned beans. Lean cuts of beef with fat trimmed off. Low-sodium, lean deli meat. Dairy Low-fat (1%) or fat-free (skim) milk. Fat-free, low-fat, or reduced-fat cheeses. Nonfat, low-sodium ricotta or cottage cheese. Low-fat or nonfat yogurt. Low-fat, low-sodium cheese. Fats and oils Soft margarine without trans fats. Vegetable oil. Low-fat, reduced-fat, or light mayonnaise and salad dressings (reduced-sodium). Canola, safflower, olive, soybean, and sunflower oils. Avocado. Seasoning and other foods Herbs. Spices. Seasoning mixes without salt. Unsalted popcorn and pretzels. Fat-free sweets. What foods are not recommended? The items listed may not be a complete list. Talk with your dietitian about what dietary choices are best for you. Grains Baked goods made with fat, such as croissants, muffins, or some breads. Dry pasta or rice meal packs. Vegetables Creamed or fried vegetables. Vegetables in a cheese sauce. Regular canned vegetables (not low-sodium or reduced-sodium). Regular canned tomato sauce and paste (not low-sodium or reduced-sodium). Regular tomato and vegetable juice (not low-sodium or reduced-sodium). Angie Fava. Olives. Fruits Canned fruit in a light or heavy syrup. Fried fruit. Fruit in cream or butter sauce. Meat and other protein foods Fatty cuts of meat. Ribs. Fried meat. Berniece Salines. Sausage. Bologna and other processed lunch meats. Salami. Fatback. Hotdogs. Bratwurst. Salted nuts and seeds. Canned beans with added salt. Canned or smoked fish. Whole eggs or egg yolks. Chicken or [place] with skin. Dairy Whole or 2% milk, cream, and half-and-half. Whole or full-fat cream cheese. Whole-fat or sweetened yogurt. Full-fat cheese. Nondairy creamers. Whipped toppings.  Processed cheese and cheese spreads. Fats and oils Butter. Stick margarine. Lard. Shortening. Ghee. Bacon fat. Tropical oils, such as coconut, palm kernel, or palm oil. Seasoning and other foods Salted popcorn and pretzels. Onion salt, garlic salt, seasoned salt, table salt, and sea salt. Worcestershire sauce. Tartar sauce. Barbecue sauce. Teriyaki sauce. Soy sauce, including reduced-sodium. Steak sauce. Canned and packaged gravies. Fish sauce. Oyster sauce. Cocktail sauce. Horseradish that you find on the shelf. Ketchup. Mustard. Meat flavorings and tenderizers. Bouillon cubes. Hot sauce and Tabasco sauce. Premade or packaged marinades. Premade or packaged taco seasonings. Relishes. Regular salad dressings. Where to find more information:  National Heart, Lung, and [place]: https://wilson-eaton.com/  American Heart Association: www.heart.org Summary  The DASH eating plan is a healthy eating plan that has been shown to reduce high blood pressure (hypertension). It may also reduce your risk for type 2 diabetes, heart disease, and stroke.  With the DASH eating plan, you should limit salt (sodium) intake to 2,300 mg a day. If you have hypertension, you may need to reduce your sodium intake to 1,500 mg a  day.  When on the DASH eating plan, aim to eat more fresh fruits and vegetables, whole grains, lean proteins, low-fat dairy, and heart-healthy fats.  Work with your health care provider or diet and nutrition specialist (dietitian) to adjust your eating plan to your individual calorie needs. This information is not intended to replace advice given to you by your health care provider. Make sure you discuss any questions you have with your health care provider. Document Revised: 10/10/2017 Document Reviewed: 10/21/2016 Elsevier Patient Education  2020 [employer].

## 2020-04-05 DIAGNOSIS — M1711 Unilateral primary osteoarthritis, right knee: Secondary | ICD-10-CM | POA: Diagnosis not present

## 2020-04-07 ENCOUNTER — Other Ambulatory Visit: Payer: Self-pay

## 2020-04-07 DIAGNOSIS — I10 Essential (primary) hypertension: Secondary | ICD-10-CM

## 2020-04-07 DIAGNOSIS — E782 Mixed hyperlipidemia: Secondary | ICD-10-CM

## 2020-04-07 NOTE — Progress Notes (Signed)
Appt was on 03/30/2020 with Sherre Poot, PharmD.

## 2020-04-12 ENCOUNTER — Encounter: Payer: Self-pay | Admitting: Critical Care Medicine

## 2020-04-12 ENCOUNTER — Ambulatory Visit (INDEPENDENT_AMBULATORY_CARE_PROVIDER_SITE_OTHER): Payer: Medicare Other | Admitting: Critical Care Medicine

## 2020-04-12 ENCOUNTER — Other Ambulatory Visit: Payer: Self-pay

## 2020-04-12 DIAGNOSIS — J449 Chronic obstructive pulmonary disease, unspecified: Secondary | ICD-10-CM | POA: Diagnosis not present

## 2020-04-12 DIAGNOSIS — J849 Interstitial pulmonary disease, unspecified: Secondary | ICD-10-CM

## 2020-04-12 DIAGNOSIS — R918 Other nonspecific abnormal finding of lung field: Secondary | ICD-10-CM | POA: Diagnosis not present

## 2020-04-12 DIAGNOSIS — M069 Rheumatoid arthritis, unspecified: Secondary | ICD-10-CM

## 2020-04-12 NOTE — Patient Instructions (Addendum)
Thank you for visiting Dr. Carlis Abbott at [facility]. We recommend the following: Orders Placed This Encounter  Procedures  . CT Chest Wo Contrast   Orders Placed This Encounter  Procedures  . CT Chest Wo Contrast    First available    Standing Status:   Future    Standing Expiration Date:   04/12/2021    Order Specific Question:   ** REASON FOR EXAM (FREE TEXT)    Answer:   follow up pulmonary nodules    Order Specific Question:   Preferred imaging location?    Answer:   [facility] Specific Question:   Radiology Contrast Protocol - do NOT remove file path    Answer:   [email or web address]    No orders of the defined types were placed in this encounter.   Return in about 4 weeks (around 05/10/2020).    Please do your part to reduce the spread of COVID-19.

## 2020-04-12 NOTE — Progress Notes (Signed)
Synopsis: Referred in October 2020 for abnormal imaging by [name], MD.  Previously patient of Dr. Gwenette Greet for OSA, COPD.  Subjective:   PATIENT ID: Sherry Burke GENDER: female DOB: 02-28-1941, MRN: [id number]  Chief Complaint  Patient presents with  . Televisit    Increased SOB   Virtual Visit via Video Note  I connected with Sherry Burke on 04/12/20 at 10:00 AM EDT by a video enabled telemedicine application and verified that I am speaking with the correct person using two identifiers.  Location: Patient: home Provider: [facility]- [phone] [place], [place]   I discussed the limitations of evaluation and management by telemedicine and the availability of in person appointments. The patient expressed understanding and agreed to proceed.   I discussed the assessment and treatment plan with the patient. The patient was provided an opportunity to ask questions and all were answered. The patient agreed with the plan and demonstrated an understanding of the instructions.   The patient was advised to call back or seek an in-person evaluation if the symptoms worsen or if the condition fails to improve as anticipated.  I provided 15 minutes of non-face-to-face time during this encounter.   Julian Hy, DO    Ms. Sherry Burke is a 79 year old woman who presents for follow-up.  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  This was stopped several years ago due to concern for methotrexate induced lung toxicity.  Unfortunately her joint symptoms have been difficult to control with other medication regimens due to side effects or lack of efficacy.  She is currently on reduced dose Xeljanz as she had intolerable nausea on full dose.  Her symptoms are improved with taking it once daily.  At her last visit with Derl Barrow, NP on 03/10/2020 she had not noticed a significant benefit from [facility].  She was started on Spiriva once daily.  Since then  she has had issues with thrush, which have improved since stopping and taking a break.  She does not have a sore throat.  She is sure to rinse her mouth after every use and is currently back to taking it once daily.  Her shortness of breath is stable; she has not noticed a significant benefit from inhalers.    OV 09/02/19: Mrs. Sherry Burke is a 79 year old woman with a history of difficult to control rheumatoid arthritis and COPD who presents at the request of her rheumatologist to evaluate if she is an appropriate candidate for methotrexate.  She is accompanied with her son.  She was on methotrexate for 8 years in the past, which was stopped about 19 years ago.  At that time she presented to the ED with a cough shortness of breath fever, feeling sick, was told in the ED that she had lung scarring from methotrexate.  Recently she has tried multiple other medications which have been ineffective at treating her RA.  She is about to start a new medication, but her Rheumatologist Dr. Lenna Gilford would like to know if MTX is an option to use again. She has been seen in the past for breathing difficulties attributed to asthma or another unknown process that has led to fixed obstructive lung disease.  Her breathing has not caused her difficulty recently.  She denies shortness of breath, cough, and wheezing.  She has not used an inhaler in a long time, but she has albuterol available if needed.  She has chronic GERD which is well controlled with omeprazole.  She has never smoked or vaped.  Her recent past medical history is notable for urinary tract infections, which she gets about 1 year.  In July 2020 she was hospitalized for a week at [facility] for severe UTI.  She was discharged to rehab, and has since returned home.  Her family thinks that she developed a UTI from her immobility from her rheumatoid arthritis-especially related to bilateral knee pain.  She was prescribed nitrofurantoin a few weeks ago for urinary tract  infection, but in the past has taken other antibiotics.   Past Medical History:  Diagnosis Date  . Acute renal insufficiency   . Asthma   . Chronic bronchitis   . COPD (chronic obstructive pulmonary disease) ([place])   . Depression   . History of migraine headaches   . Hyperlipemia   . OSA (obstructive sleep apnea)    pt states she doesn't have it  . Osteoarthritis   . Pneumonia   . Rheumatoid arthritis(714.0)   . RLS (restless legs syndrome)   . Vitamin D deficiency disease      Family History  Problem Relation Age of Onset  . Lung cancer Father   . Stroke Mother   . Hypertension Mother   . Diabetes Mother   . Rheum arthritis Sister   . Rheum arthritis Sister      Past Surgical History:  Procedure Laterality Date  . KNEE SURGERY Bilateral    when younger  . TOTAL ABDOMINAL HYSTERECTOMY  06/1988    Social History   Socioeconomic History  . Marital status: Married    Spouse name: Durene Fruits  . Number of children: 3  . Years of education: Not on file  . Highest education level: Not on file  Occupational History  . Occupation: retired    Comment: homemaker  Tobacco Use  . Smoking status: Passive Smoke Exposure - Never Smoker  . Smokeless tobacco: Never Used  Substance and Sexual Activity  . Alcohol use: No  . Drug use: No  . Sexual activity: Not Currently    Birth control/protection: None    Comment: Married  Other Topics Concern  . Not on file  Social History Narrative   Lives with her husband, who is very helpful and supportive.   Social Determinants of Health   Financial Resource Strain: Low Risk   . Difficulty of Paying Living Expenses: Not hard at all  Food Insecurity: No Food Insecurity  . Worried About Charity fundraiser in the Last Year: Never true  . Ran Out of Food in the Last Year: Never true  Transportation Needs: No Transportation Needs  . Lack of Transportation (Medical): No  . Lack of Transportation (Non-Medical): No  Physical Activity:  Sufficiently Active  . Days of Exercise per Week: 7 days  . Minutes of Exercise per Session: 60 min  Stress: No Stress Concern Present  . Feeling of Stress : Only a little  Social Connections: Not Isolated  . Frequency of Communication with Friends and Family: More than three times a week  . Frequency of Social Gatherings with Friends and Family: Once a week  . Attends Religious Services: More than 4 times per year  . Active Member of Clubs or Organizations: Yes  . Attends Archivist Meetings: 1 to 4 times per year  . Marital Status: Married  Human resources officer Violence: Not At Risk  . Fear of Current or Ex-Partner: No  . Emotionally Abused: No  . Physically Abused: No  . Sexually  Abused: No     Allergies  Allergen Reactions  . Sulfa Antibiotics Nausea And Vomiting  . Actemra [Tocilizumab]   . Amlodipine   . Celebrex [Celecoxib]   . Methotrexate Derivatives Other (See Comments)    sweating  . Niaspan [Niacin]     Severe flushing  . Remicade [Infliximab] Nausea And Vomiting and Other (See Comments)    Affects nervous system  . Welchol [Colesevelam] Other (See Comments)    Abdominal pain and headache     Immunization History  Administered Date(s) Administered  . Influenza Whole 08/12/2011  . Influenza, High Dose Seasonal PF 08/12/2019  . Moderna SARS-COVID-2 Vaccination 11/26/2019, 12/24/2019    Outpatient Medications Prior to Visit  Medication Sig Dispense Refill  . albuterol (VENTOLIN HFA) 108 (90 BASE) MCG/ACT inhaler Inhale 1-2 puffs into the lungs every 4 (four) hours as needed for shortness of breath.     Marland Kitchen atorvastatin (LIPITOR) 40 MG tablet Take 40 mg by mouth daily.     . cloNIDine (CATAPRES) 0.1 MG tablet Take 1 tablet by mouth twice daily 180 tablet 0  . diphenhydramine-acetaminophen (TYLENOL PM) 25-500 MG TABS tablet Take 1 tablet by mouth at bedtime as needed.    Marland Kitchen estradiol (ESTRACE VAGINAL) 0.1 MG/GM vaginal cream     . fluticasone (FLONASE) 50  MCG/ACT nasal spray Place 2 sprays into both nostrils daily. (Patient taking differently: Place 2 sprays into both nostrils daily as needed for allergies. ) 16 g 6  . furosemide (LASIX) 40 MG tablet Take 40 mg by mouth daily as needed.     Marland Kitchen HYDROcodone-acetaminophen (NORCO/VICODIN) 5-325 MG tablet Take 1 tablet by mouth every 6 (six) hours as needed for moderate pain. 120 tablet 0  . hydroxychloroquine (PLAQUENIL) 200 MG tablet Take 1 tablet (200 mg total) by mouth 2 (two) times daily. 60 tablet 2  . lisinopril (ZESTRIL) 10 MG tablet Take 1 tablet by mouth once daily 90 tablet 0  . nitrofurantoin, macrocrystal-monohydrate, (MACROBID) 100 MG capsule Take 1 capsule (100 mg total) by mouth 2 (two) times daily. 14 capsule 0  . omeprazole (PRILOSEC) 40 MG capsule Take 1 capsule (40 mg total) by mouth 2 (two) times daily. 180 capsule 3  . PARoxetine (PAXIL CR) 25 MG 24 hr tablet Take 1 tablet (25 mg total) by mouth daily. 30 tablet 2  . polyethylene glycol (MIRALAX / GLYCOLAX) 17 g packet Take 17 g by mouth daily. Use daily as needed for constipation    . rOPINIRole (REQUIP) 1 MG tablet Take 1 mg by mouth 2 (two) times daily. Take 1 tablet every morning and 2 at bedtime    . spironolactone (ALDACTONE) 25 MG tablet     . Tiotropium Bromide Monohydrate (SPIRIVA RESPIMAT) 2.5 MCG/ACT AERS Inhale 2 puffs into the lungs daily. 8 g 0  . Tofacitinib Citrate (XELJANZ) 5 MG TABS Take 5 mg by mouth daily.     . Vitamin D, Ergocalciferol, (DRISDOL) 1.25 MG (50000 UNIT) CAPS capsule Take 1 capsule by mouth once a week (Patient taking differently: Take 50,000 Units by mouth every Wednesday. ) 12 capsule 0  . gabapentin (NEURONTIN) 300 MG capsule Take 1 capsule (300 mg total) by mouth 2 (two) times daily for 180 doses. 180 capsule 1   No facility-administered medications prior to visit.    Review of Systems  Constitutional: Negative for chills, diaphoresis, fever, malaise/fatigue and weight loss.       Just  recovering from UTI  HENT: Negative for congestion,  ear pain and sore throat.   Respiratory: Negative for cough, hemoptysis, sputum production, shortness of breath and wheezing.   Cardiovascular: Positive for leg swelling. Negative for chest pain and palpitations.  Gastrointestinal: Negative for abdominal pain, blood in stool, heartburn and nausea.  Genitourinary: Positive for frequency. Negative for hematuria.  Musculoskeletal: Positive for joint pain and myalgias.       Due to the RA  Skin: Negative for itching and rash.  Neurological: Positive for weakness. Negative for dizziness and headaches.       Global weakness  Endo/Heme/Allergies: Negative for environmental allergies. Does not bruise/bleed easily.  Psychiatric/Behavioral: Negative for depression. The patient is nervous/anxious.      Objective:   There were no vitals filed for this visit.   on  RA BMI Readings from Last 3 Encounters:  03/10/20 37.30 kg/m  02/25/20 37.15 kg/m  12/27/19 39.10 kg/m   Wt Readings from Last 3 Encounters:  03/10/20 191 lb (86.6 kg)  02/25/20 190 lb 3.2 oz (86.3 kg)  12/27/19 193 lb 9.6 oz (87.8 kg)    Physical Exam-limited due to televisit.  Answering questions appropriately, no conversational dyspnea.   CBC    Component Value Date/Time   WBC 7.6 08/23/2013 1829   RBC 4.50 08/23/2013 1829   HGB 12.9 08/23/2013 1829   HCT 39.5 08/23/2013 1829   PLT 161 08/23/2013 1829   MCV 87.8 08/23/2013 1829   MCH 28.7 08/23/2013 1829   MCHC 32.7 08/23/2013 1829   RDW 14.2 08/23/2013 1829   LYMPHSABS 1.5 08/23/2013 1829   MONOABS 0.6 08/23/2013 1829   EOSABS 0.3 08/23/2013 1829   BASOSABS 0.1 08/23/2013 1829     Chest Imaging- films reviewed: CXR, 2- view 03/18/2011: Low lung volumes, R>L small granulomas. No masses or opacities.  X-ray C-spine, flexion and extension-report reviewed: Degenerative changes, most prominent in the lower cervical spine.  Slight anterolisthesis of C4 on C5.   Normal C1-C2 joint.  HRCT chest 09/08/2019 -subpleural reticular changes and mild honeycombing in upper (mostly right upper) and lower lobes.  Calcified granuloma lateral right middle lobe.  Mild airway thickening.  Few subcentimeter subpleural nodules, new since 2016.  Pulmonary Functions Testing Results: PFT Results Latest Ref Rng & Units 03/10/2020  FVC-Pre L 1.56  FVC-Predicted Pre % 70  FVC-Post L 1.60  FVC-Predicted Post % 71  Pre FEV1/FVC % % 66  Post FEV1/FCV % % 67  FEV1-Pre L 1.03  FEV1-Predicted Pre % 62  FEV1-Post L 1.07  DLCO UNC% % 71  DLCO COR %Predicted % 98  TLC L 4.23  TLC % Predicted % 94  RV % Predicted % 109   03/10/2020: Moderate obstruction without bronchodilator reversibility.  No significant restriction.  Mild diffusion impairment.  Flow volume loop supports mixed obstruction and restriction.  Significant decline in FVC, FEV1, diffusion capacity compared to 2012 PFTs.  04/10/2011: FVC 2.64 (92%)--> 2.63 (92%, 0% change) FEV1 1.55 L (76%) --> 1.57 L (77%, 1% change) Ratio 59 RV 1.8 (94%)  TLC 4.44 (92%) via nitrogen washout DLCO 20.3 (83%)  flow volume loop consistent with obstruction -Mild obstruction, no significant restriction or diffusion impairment.      Assessment & Plan:     ICD-10-CM   1. ILD (interstitial lung disease) ([place])  J84.9   2. Chronic obstructive pulmonary disease, unspecified COPD type ([place])  J44.9   3. Rheumatoid arthritis involving multiple sites, unspecified whether rheumatoid factor present ([place])  M06.9   4. Pulmonary nodules  R91.8  5. Abnormal findings on diagnostic imaging of lung  R91.8 CT Chest Wo Contrast    Mildly immunosuppressed due to RA treatment; uncontrolled synovitis.  Concern for RA-ILD rather than MTX lung toxicity.  -Ok to restart MTX if needed. Would get PFTs 3 months after starting for surveillance.  -Repeat HRCT chest in 1 year; may require antifibrotic's if she has progressive fibrosis.  Unfortunately  she has significant side effects of medications that limit her tolerability, and she may not do well with antifibrotic's. -Recommend against any future nitrofurantoin use. -Up-to-date on seasonal flu, covid, and pneumococcal vaccinations.  COPD- asthma vs chronic bronchiolitis related to RA.  No previous tobacco use. -Con't to use Spiriva once daily, but may have to stop if continues to cause symptoms.  Pulmonary nodules, possibly RA associated nodules -Follow up CT scan ordered.  RTC in 1 month.   Current Outpatient Medications:  .  albuterol (VENTOLIN HFA) 108 (90 BASE) MCG/ACT inhaler, Inhale 1-2 puffs into the lungs every 4 (four) hours as needed for shortness of breath. , Disp: , Rfl:  .  atorvastatin (LIPITOR) 40 MG tablet, Take 40 mg by mouth daily. , Disp: , Rfl:  .  cloNIDine (CATAPRES) 0.1 MG tablet, Take 1 tablet by mouth twice daily, Disp: 180 tablet, Rfl: 0 .  diphenhydramine-acetaminophen (TYLENOL PM) 25-500 MG TABS tablet, Take 1 tablet by mouth at bedtime as needed., Disp: , Rfl:  .  estradiol (ESTRACE VAGINAL) 0.1 MG/GM vaginal cream, , Disp: , Rfl:  .  fluticasone (FLONASE) 50 MCG/ACT nasal spray, Place 2 sprays into both nostrils daily. (Patient taking differently: Place 2 sprays into both nostrils daily as needed for allergies. ), Disp: 16 g, Rfl: 6 .  furosemide (LASIX) 40 MG tablet, Take 40 mg by mouth daily as needed. , Disp: , Rfl:  .  HYDROcodone-acetaminophen (NORCO/VICODIN) 5-325 MG tablet, Take 1 tablet by mouth every 6 (six) hours as needed for moderate pain., Disp: 120 tablet, Rfl: 0 .  hydroxychloroquine (PLAQUENIL) 200 MG tablet, Take 1 tablet (200 mg total) by mouth 2 (two) times daily., Disp: 60 tablet, Rfl: 2 .  lisinopril (ZESTRIL) 10 MG tablet, Take 1 tablet by mouth once daily, Disp: 90 tablet, Rfl: 0 .  nitrofurantoin, macrocrystal-monohydrate, (MACROBID) 100 MG capsule, Take 1 capsule (100 mg total) by mouth 2 (two) times daily., Disp: 14 capsule, Rfl:  0 .  omeprazole (PRILOSEC) 40 MG capsule, Take 1 capsule (40 mg total) by mouth 2 (two) times daily., Disp: 180 capsule, Rfl: 3 .  PARoxetine (PAXIL CR) 25 MG 24 hr tablet, Take 1 tablet (25 mg total) by mouth daily., Disp: 30 tablet, Rfl: 2 .  polyethylene glycol (MIRALAX / GLYCOLAX) 17 g packet, Take 17 g by mouth daily. Use daily as needed for constipation, Disp: , Rfl:  .  rOPINIRole (REQUIP) 1 MG tablet, Take 1 mg by mouth 2 (two) times daily. Take 1 tablet every morning and 2 at bedtime, Disp: , Rfl:  .  spironolactone (ALDACTONE) 25 MG tablet, , Disp: , Rfl:  .  Tiotropium Bromide Monohydrate (SPIRIVA RESPIMAT) 2.5 MCG/ACT AERS, Inhale 2 puffs into the lungs daily., Disp: 8 g, Rfl: 0 .  Tofacitinib Citrate (XELJANZ) 5 MG TABS, Take 5 mg by mouth daily. , Disp: , Rfl:  .  Vitamin D, Ergocalciferol, (DRISDOL) 1.25 MG (50000 UNIT) CAPS capsule, Take 1 capsule by mouth once a week (Patient taking differently: Take 50,000 Units by mouth every Wednesday. ), Disp: 12 capsule, Rfl: 0 .  gabapentin (NEURONTIN) 300 MG capsule, Take 1 capsule (300 mg total) by mouth 2 (two) times daily for 180 doses., Disp: 180 capsule, Rfl: 1   Julian Hy, DO [facility] 04/12/2020 10:49 AM

## 2020-04-18 ENCOUNTER — Encounter: Payer: Self-pay | Admitting: Critical Care Medicine

## 2020-04-18 DIAGNOSIS — R918 Other nonspecific abnormal finding of lung field: Secondary | ICD-10-CM | POA: Diagnosis not present

## 2020-04-24 ENCOUNTER — Ambulatory Visit: Payer: Medicare Other

## 2020-04-24 DIAGNOSIS — I10 Essential (primary) hypertension: Secondary | ICD-10-CM

## 2020-04-24 DIAGNOSIS — E782 Mixed hyperlipidemia: Secondary | ICD-10-CM

## 2020-04-24 NOTE — Patient Instructions (Addendum)
Visit Information  Goals Addressed            This Visit's Progress   . Pharmacy Care Plan       CARE PLAN ENTRY  Current Barriers:  . Chronic Disease Management support, education, and care coordination needs related to Hypertension and Hyperlipidemia   Hypertension . Pharmacist Clinical Goal(s): o Over the next 90 days, patient will work with PharmD and providers to maintain BP goal <140/90 . Current regimen:  o Lisinopril 10 mg daily, Furosemide 40 mg daily prn, Spironolactone 25 mg, Clonidine 0.1 mg bid . Interventions: o Recommend patient begin checking bp weekly to monitor.  . Patient self care activities - Over the next 90 days, patient will: o Check BP weekly, document, and provide at future appointments o Ensure daily salt intake < 2300 mg/day  Hyperlipidemia . Pharmacist Clinical Goal(s): o Over the next 90 days, patient will work with PharmD and providers to maintain LDL goal < 100 . Current regimen:  o Atorvastatin 40 mg daily.  . Interventions: o Recommend patient consume a low-fat, heart healthy diet.  . Patient self care activities - Over the next 90 days, patient will: o Continue to take medications daily as prescribed. Be mindful of fat in diet each day.   Medication management . Pharmacist Clinical Goal(s): o Over the next 90 days, patient will work with PharmD and providers to maintain optimal medication adherence . Current pharmacy: [facility] . Interventions o Comprehensive medication review performed. o Continue current medication management strategy . Patient self care activities - Over the next 90 days, patient will: o Focus on medication adherence by continuing to use pill box.  o Take medications as prescribed o Report any questions or concerns to PharmD and/or provider(s)  Please see past updates related to this goal by clicking on the "Past Updates" button in the selected goal         The patient verbalized understanding of  instructions provided today and declined a print copy of patient instruction materials.   Telephone follow up appointment with pharmacy team member scheduled for: 10/2020  Sherry Burke, PharmD, [facility] Clinical Pharmacist [facility] [phone] (office) [phone] (mobile) Tofacitinib extended-release tablets What is this medicine? TOFACITINIB (TOE fa SYE it nib) is a medicine that works on the immune system. This medicine is used to treat rheumatoid arthritis and psoriatic arthritis. It is also used to treat ulcerative colitis. This medicine may be used for other purposes; ask your health care provider or pharmacist if you have questions. COMMON BRAND NAME(S): [name] What should I tell my health care provider before I take this medicine? They need to know if you have any of these conditions:  cancer  diabetes  heart disease  high blood pressure  high cholesterol  history of blood clots  HIV or AIDS  immune system problems  infection (especially a virus infection such as hepatitis B, chickenpox, cold sores, or herpes)  kidney disease  liver disease  low blood counts, like low white cell, platelet, or red cell counts  lung or breathing disease, like asthma  organ transplant  stomach or intestine problems  tuberculosis, a positive skin test for tuberculosis, or have recently been in close contact with someone who has tuberculosis  an unusual or allergic reaction to tofacitinib, other medicines, foods, dyes, or preservatives  pregnant or trying to get pregnant  breast-feeding How should I use this medicine? Take this medicine by mouth with a glass of water.  Follow the directions on the prescription label. Swallow the tablet whole. Do not cut, crush or chew this medicine. You can take it with or without food. If it upsets your stomach, take it with food. Take your medicine at regular intervals. Do not take it more often than directed. Do not stop  taking except on your doctor's advice. A special MedGuide will be given to you by the pharmacist with each prescription and refill. Be sure to read this information carefully each time. Talk to your pediatrician regarding the use of this medicine in children. Special care may be needed. Overdosage: If you think you have taken too much of this medicine contact a poison control center or emergency room at once. NOTE: This medicine is only for you. Do not share this medicine with others. What if I miss a dose? If you miss a dose, take it as soon as you can. If it is almost time for your next dose, take only that dose. Do not take double or extra doses. What may interact with this medicine?  antiviral medicines for hepatitis, HIV or AIDS  azathioprine  biologic medicines such as abatacept, adalimumab, anakinra, certolizumab, etanercept, golimumab, infliximab, ofatumumab, rituximab, sarilumab, secukinumab, tocilizumab, ustekinumab, vedolizumab  certain medicines for fungal infections like fluconazole, itraconazole, ketoconazole, voriconazole  certain medicines for seizures like carbamazepine, phenobarbital, phenytoin  cyclosporine  live vaccines  medicines that lower your chance of fighting infection  rifampin  supplements, such as St. John's wort  tacrolimus This list may not describe all possible interactions. Give your health care provider a list of all the medicines, herbs, non-prescription drugs, or dietary supplements you use. Also tell them if you smoke, drink alcohol, or use illegal drugs. Some items may interact with your medicine. What should I watch for while using this medicine? Visit your healthcare professional for regular checks on your progress. Tell your healthcare professional if your symptoms do not start to get better or if they get worse. You may need blood work done while you are taking this medicine. The tablet shell of this medicine does not dissolve. This is  normal. The tablet shell may appear whole in the stool. This is not a cause for concern. Avoid taking products that contain aspirin, acetaminophen, ibuprofen, naproxen, or ketoprofen unless instructed by your doctor. These medicines may hide a fever. Call your doctor or health care professional for advice if you get a fever, chills or sore throat, or other symptoms of a cold or flu. Do not treat yourself. This drug decreases your body's ability to fight infections. Try to avoid being around people who are sick. Women should inform their doctor if they wish to become pregnant or think they might be pregnant. There is a potential for serious side effects to an unborn child. Talk to your health care professional or pharmacist for more information. Do not breast-feed an infant while taking this medicine or for at least 36 hours after stopping it. What side effects may I notice from receiving this medicine? Side effects that you should report to your doctor or health care professional as soon as possible:  allergic reactions like skin rash, itching or hives, swelling of the face, lips, or tongue  breathing problems  dizziness  signs and symptoms of a blood clot such as chest pain; shortness of breath; pain, swelling, or warmth in the leg  signs of infection like fever; chills; cough; sore throat; pain or trouble passing urine  signs and symptoms of liver injury  like dark yellow or Charnise Lovan urine; general ill feeling or flu-like symptoms; light-colored stools; loss of appetite; nausea; right upper belly pain; unusually weak or tired; yellowing of the eyes or skin  signs and symptoms of low red blood cells or anemia such as unusually weak or tired; feeling faint or lightheaded; falls; breathing problems  stomach pain or a sudden change in bowel habits Side effects that usually do not require medical attention (report to your doctor or health care professional if they continue or are  bothersome):  diarrhea  headache  muscle aches  runny nose  sinus trouble This list may not describe all possible side effects. Call your doctor for medical advice about side effects. You may report side effects to FDA at 1-800-FDA-1088. Where should I keep my medicine? Keep out of the reach of children. Store between 20 and 25 degrees C (68 and 77 degrees F). Throw away any unused medicine after the expiration date. NOTE: This sheet is a summary. It may not cover all possible information. If you have questions about this medicine, talk to your doctor, pharmacist, or health care provider.  2020 Elsevier/Gold Standard (2019-03-09 22:10:57)

## 2020-04-24 NOTE — Chronic Care Management (AMB) (Signed)
Chronic Care Management Pharmacy  Name: Sherry Burke  MRN: [id number] DOB: 30-Jul-1941  Chief Complaint/ HPI  Sherry Burke,  79 y.o. , female presents for their Initial CCM visit with the clinical pharmacist via telephone due to COVID-19 Pandemic.  PCP : [name], MD  Their chronic conditions include: HTN, COPD, GERD, HLD, Rheumatoid arthritis, RLS, Depression. Office Visits: 02/25/2020 - Start paxil cr for worsening depression. Macrobid rx given for acute cystitis.  12/27/2019 - macrodantin for acute cystitis without hematuria. Consult Visit: 04/12/2020 - Ok to restart MTX. Avoid Macrobid use in the future. Spiriva once daily continue unless symptoms.  03/10/2020 - referred by Dr. Lenna Gilford to get clearance to resume methotrexate. Patient experiencing nausea with [name]. No benefit from [facility] so changing to Spiriva respimat 2.5 mcg. 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 Methotrexate would be ok to resume and helpful to preventing progression of ILD/arthritis. 02/13/2020 - Hospitalization for atypical chest pain/dyspepsia.Increased PPI to twice daily.      Medications: Outpatient Encounter Medications as of 04/24/2020  Medication Sig  . albuterol (VENTOLIN HFA) 108 (90 BASE) MCG/ACT inhaler Inhale 1-2 puffs into the lungs every 4 (four) hours as needed for shortness of breath.   Marland Kitchen atorvastatin (LIPITOR) 40 MG tablet Take 40 mg by mouth daily.   . cloNIDine (CATAPRES) 0.1 MG tablet Take 1 tablet by mouth twice daily  . diphenhydramine-acetaminophen (TYLENOL PM) 25-500 MG TABS tablet Take 1 tablet by mouth at bedtime as needed.  Marland Kitchen estradiol (ESTRACE VAGINAL) 0.1 MG/GM vaginal cream   . fluticasone (FLONASE) 50 MCG/ACT nasal spray Place 2 sprays into both nostrils daily. (Patient taking differently: Place 2 sprays into both nostrils daily as needed for allergies. )  . furosemide (LASIX) 40 MG tablet Take 40 mg by mouth  daily as needed.   . gabapentin (NEURONTIN) 300 MG capsule Take 1 capsule (300 mg total) by mouth 2 (two) times daily for 180 doses.  Marland Kitchen HYDROcodone-acetaminophen (NORCO/VICODIN) 5-325 MG tablet Take 1 tablet by mouth every 6 (six) hours as needed for moderate pain.  . hydroxychloroquine (PLAQUENIL) 200 MG tablet Take 1 tablet (200 mg total) by mouth 2 (two) times daily.  Marland Kitchen lisinopril (ZESTRIL) 10 MG tablet Take 1 tablet by mouth once daily  . nitrofurantoin, macrocrystal-monohydrate, (MACROBID) 100 MG capsule Take 1 capsule (100 mg total) by mouth 2 (two) times daily.  Marland Kitchen omeprazole (PRILOSEC) 40 MG capsule Take 1 capsule (40 mg total) by mouth 2 (two) times daily.  Marland Kitchen PARoxetine (PAXIL CR) 25 MG 24 hr tablet Take 1 tablet (25 mg total) by mouth daily.  . polyethylene glycol (MIRALAX / GLYCOLAX) 17 g packet Take 17 g by mouth daily. Use daily as needed for constipation  . rOPINIRole (REQUIP) 1 MG tablet Take 1 mg by mouth 2 (two) times daily. Take 1 tablet every morning and 2 at bedtime  . spironolactone (ALDACTONE) 25 MG tablet   . Tiotropium Bromide Monohydrate (SPIRIVA RESPIMAT) 2.5 MCG/ACT AERS Inhale 2 puffs into the lungs daily.  . Tofacitinib Citrate (XELJANZ) 5 MG TABS Take 5 mg by mouth daily.   . Vitamin D, Ergocalciferol, (DRISDOL) 1.25 MG (50000 UNIT) CAPS capsule Take 1 capsule by mouth once a week (Patient taking differently: Take 50,000 Units by mouth every Wednesday. )   No facility-administered encounter medications on file as of 04/24/2020.   Allergies  Allergen Reactions  . Sulfa Antibiotics Nausea And Vomiting  . Actemra [Tocilizumab]   .  Amlodipine   . Celebrex [Celecoxib]   . Methotrexate Derivatives Other (See Comments)    sweating  . Niaspan [Niacin]     Severe flushing  . Remicade [Infliximab] Nausea And Vomiting and Other (See Comments)    Affects nervous system  . Welchol [Colesevelam] Other (See Comments)    Abdominal pain and headache   SDOH Screenings    Alcohol Screen: Low Risk   . Last Alcohol Screening Score (AUDIT): 0  Depression (PHQ2-9): Low Risk   . PHQ-2 Score: 0  Financial Resource Strain: Low Risk   . Difficulty of Paying Living Expenses: Not hard at all  Food Insecurity: No Food Insecurity  . Worried About Charity fundraiser in the Last Year: Never true  . Ran Out of Food in the Last Year: Never true  Housing: Low Risk   . Last Housing Risk Score: 0  Physical Activity: Sufficiently Active  . Days of Exercise per Week: 7 days  . Minutes of Exercise per Session: 60 min  Social Connections: Socially Integrated  . Frequency of Communication with Friends and Family: More than three times a week  . Frequency of Social Gatherings with Friends and Family: Once a week  . Attends Religious Services: More than 4 times per year  . Active Member of Clubs or Organizations: Yes  . Attends Archivist Meetings: 1 to 4 times per year  . Marital Status: Married  Stress: No Stress Concern Present  . Feeling of Stress : Only a little  Tobacco Use: Medium Risk  . Smoking Tobacco Use: Passive Smoke Exposure - Never Smoker  . Smokeless Tobacco Use: Never Used  Transportation Needs: No Transportation Needs  . Lack of Transportation (Medical): No  . Lack of Transportation (Non-Medical): No     Current Diagnosis/Assessment:  Goals Addressed            This Visit's Progress   . Pharmacy Care Plan       CARE PLAN ENTRY  Current Barriers:  . Chronic Disease Management support, education, and care coordination needs related to Hypertension and Hyperlipidemia   Hypertension . Pharmacist Clinical Goal(s): o Over the next 90 days, patient will work with PharmD and providers to maintain BP goal <140/90 . Current regimen:  o Lisinopril 10 mg daily, Furosemide 40 mg daily prn, Spironolactone 25 mg, Clonidine 0.1 mg bid . Interventions: o Recommend patient begin checking bp weekly to monitor.  . Patient self care activities -  Over the next 90 days, patient will: o Check BP weekly, document, and provide at future appointments o Ensure daily salt intake < 2300 mg/day  Hyperlipidemia . Pharmacist Clinical Goal(s): o Over the next 90 days, patient will work with PharmD and providers to maintain LDL goal < 100 . Current regimen:  o Atorvastatin 40 mg daily.  . Interventions: o Recommend patient consume a low-fat, heart healthy diet.  . Patient self care activities - Over the next 90 days, patient will: o Continue to take medications daily as prescribed. Be mindful of fat in diet each day.   Medication management . Pharmacist Clinical Goal(s): o Over the next 90 days, patient will work with PharmD and providers to maintain optimal medication adherence . Current pharmacy: [facility] . Interventions o Comprehensive medication review performed. o Continue current medication management strategy . Patient self care activities - Over the next 90 days, patient will: o Focus on medication adherence by continuing to use pill box.  o Take medications as  prescribed o Report any questions or concerns to PharmD and/or provider(s)  Please see past updates related to this goal by clicking on the "Past Updates" button in the selected goal         COPD / Asthma / Tobacco   Last spirometry score: FVC 1.60 (71%), FEV1 1.07 (65%), ratio 67, TLC 94%, DLCOcor 11.82 (71%).  Gold Grade: Gold 2 (FEV1 50-79%)  Eosinophil count:   Lab Results  Component Value Date/Time   EOSPCT 4 08/23/2013 06:29 PM  %                               Eos (Absolute):  Lab Results  Component Value Date/Time   EOSABS 0.3 08/23/2013 06:29 PM    Tobacco Status:  Social History   Tobacco Use  Smoking Status Passive Smoke Exposure - Never Smoker  Smokeless Tobacco Never Used    Patient has failed these meds in past: advair, dulera,  Patient is currently controlled on the following medications: flonase 2 sprays each nostril daily  prn, Spiriva 2.5 mcg/act 2 puffs daily, albuterol prn Using maintenance inhaler regularly? Yes Frequency of rescue inhaler use:  infrequently  We discussed: Patient becomes SOB and tight in chest at times. Had not been using rescue inhaler because misunderstood directions to use both inhalers. Has had lung problems since she was a child. She stays indoors to avoid outdoor allergen triggers.   Update 04/24/2020 - Dr. Carlis Abbott is ok with patient resuming MTX if [name] begins not to work. Patient states that she has had a recent CT scan. Dr. Carlis Abbott determined lung damage is not due to MTX but the RA per patient.   Plan  Continue current medications     and  Other Diagnosis:GERD   Patient has failed these meds in past: pantoprazole 40 mg  Patient is currently controlled on the following medications: omeprazole 40 mg bid  We discussed:  Patient was increased to twice daily omeprazole. She was having lots of nausea and symptoms while taking Xeljanz bid. Pharmacist discussed doing a trial of slowly tapering omeprazole dose to once daily.   Update 04/24/2020 - Patient was unable to taper dow to omeprazole 40 mg daily due to reflux symptoms presenting. Patient back to taking Omeprazole 40 mg bid. She returned to twice daily and symptoms resolved. Patient still tolerating [name] daily.   Plan  Continue Omeprazole 40 mg bid.    Rheumatoid Arthritis/Pain   Patient has failed these meds in past: methotrexate, lefluonamide, morphine, nabumetone Patient is currently controlled on the following medications: hydroxychloroquine 200 mg bid, xeljanz 5 mg daily, hydrocodone-apap 5-325 mg q6h prn pain.  We discussed:  Patient had to stop methotrexate due to potential of scar tissue in lungs. Now they think it is RA related not MTX related. Patient recently went to pulmonologist to be cleared if needs to resumed methotrexate therapy. Takes hydrocodone 1-2 times per week for severe pain. Xeljanz bid dosing was  making patient very sick. She now takes it once daily and GI intolerance is improved and joint pain/stiffness is controlled.   Update 04/24/2020 - Scheduled for gel shot in July with Orthopedic MD. She is already feeling discomfort in both knees.   Plan  Continue current medications  Medication Management   Pt uses Mail order pharmacy for all medications Uses pill box? Yes Pt endorses 100% compliance  We discussed: Patient uses pill box to organize medications.  Plan  Continue current medication management strategy    Follow up: 6 month phone visit

## 2020-05-08 ENCOUNTER — Other Ambulatory Visit: Payer: Self-pay | Admitting: Physician Assistant

## 2020-05-12 ENCOUNTER — Other Ambulatory Visit: Payer: Self-pay | Admitting: Family Medicine

## 2020-05-12 DIAGNOSIS — F331 Major depressive disorder, recurrent, moderate: Secondary | ICD-10-CM

## 2020-05-16 ENCOUNTER — Telehealth: Payer: Self-pay | Admitting: Critical Care Medicine

## 2020-05-16 ENCOUNTER — Ambulatory Visit: Payer: Medicare Other | Admitting: Critical Care Medicine

## 2020-05-16 MED ORDER — SPIRIVA RESPIMAT 2.5 MCG/ACT IN AERS: 2.0000 | INHALATION_SPRAY | Freq: Every day | RESPIRATORY_TRACT | 3 refills | Status: DC

## 2020-05-16 NOTE — Telephone Encounter (Signed)
Spoke with pt and she states she is doing good on Spiriva and would like a refill sent to pharmacy. Refill sent to [facility] in [place]. Pt verbalized understanding. Nothing further needed at this time.

## 2020-05-17 ENCOUNTER — Ambulatory Visit (INDEPENDENT_AMBULATORY_CARE_PROVIDER_SITE_OTHER): Payer: Medicare Other | Admitting: Critical Care Medicine

## 2020-05-17 ENCOUNTER — Other Ambulatory Visit: Payer: Self-pay

## 2020-05-17 ENCOUNTER — Encounter: Payer: Self-pay | Admitting: Critical Care Medicine

## 2020-05-17 DIAGNOSIS — J410 Simple chronic bronchitis: Secondary | ICD-10-CM | POA: Diagnosis not present

## 2020-05-17 DIAGNOSIS — J849 Interstitial pulmonary disease, unspecified: Secondary | ICD-10-CM | POA: Diagnosis not present

## 2020-05-17 MED ORDER — ALBUTEROL SULFATE HFA 108 (90 BASE) MCG/ACT IN AERS: 2.0000 | INHALATION_SPRAY | Freq: Four times a day (QID) | RESPIRATORY_TRACT | 11 refills | Status: AC | PRN

## 2020-05-17 NOTE — Progress Notes (Signed)
Synopsis: Referred in October 2020 for abnormal imaging by [name], MD.  Previously patient of Dr. Gwenette Greet for OSA, COPD.  Subjective:   PATIENT ID: Sherry Burke GENDER: female DOB: 1941-08-16, MRN: [id number]  Chief Complaint  Patient presents with   Follow-up    Patient is having shortness of breath with exertion and seems to be a little worse over the last week. Dry cough    Virtual Visit via Video Note  I connected with Sherry Burke on 05/17/20 at  4:30 PM EDT by a video enabled telemedicine application and verified that I am speaking with the correct person using two identifiers.  Location: Patient: home Provider: [facility] [address], [place]   I discussed the limitations of evaluation and management by telemedicine and the availability of in person appointments. The patient expressed understanding and agreed to proceed.  I discussed the assessment and treatment plan with the patient. The patient was provided an opportunity to ask questions and all were answered. The patient agreed with the plan and demonstrated an understanding of the instructions.   The patient was advised to call back or seek an in-person evaluation if the symptoms worsen or if the condition fails to improve as anticipated.  I provided 13 minutes of non-face-to-face time during this encounter.   Julian Hy, DO    [name] is a 79 y/o woman who is following up since starting Spiriva once daily.  She has noticed an improvement in shortness of breath and gets short of breath less frequently.  Overall she still has shortness of breath that she associates with age and chronic lung disease, but feels that it is improved.  She initially had mild thrush, which is improved with rinsing her mouth more aggressively.  She has not been using albuterol frequently, but is requesting an albuterol refill.  She has an appointment with Dr. Lenna Gilford from rheumatology later this month.  She is not  currently on methotrexate, but it may be required as a backup if her current medication is not working.  She has some arthralgias now, but is resistant to taking hydrocodone at this time.    OV 04/12/20: [name] is a 79 year old woman who presents for follow-up.  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  This was stopped several years ago due to concern for methotrexate induced lung toxicity.  Unfortunately her joint symptoms have been difficult to control with other medication regimens due to side effects or lack of efficacy.  She is currently on reduced dose Xeljanz as she had intolerable nausea on full dose.  Her symptoms are improved with taking it once daily.  At her last visit with Derl Barrow, NP on 03/10/2020 she had not noticed a significant benefit from [facility].  She was started on Spiriva once daily.  Since then she has had issues with thrush, which have improved since stopping and taking a break.  She does not have a sore throat.  She is sure to rinse her mouth after every use and is currently back to taking it once daily.  Her shortness of breath is stable; she has not noticed a significant benefit from inhalers.  OV 09/02/19: [name] is a 79 year old woman with a history of difficult to control rheumatoid arthritis and COPD who presents at the request of her rheumatologist to evaluate if she is an appropriate candidate for methotrexate.  She is accompanied with her son.  She was on  methotrexate for 8 years in the past, which was stopped about 19 years ago.  At that time she presented to the ED with a cough shortness of breath fever, feeling sick, was told in the ED that she had lung scarring from methotrexate.  Recently she has tried multiple other medications which have been ineffective at treating her RA.  She is about to start a new medication, but her Rheumatologist Dr. Lenna Gilford would like to know if MTX is an option to use again. She has been seen in  the past for breathing difficulties attributed to asthma or another unknown process that has led to fixed obstructive lung disease.  Her breathing has not caused her difficulty recently.  She denies shortness of breath, cough, and wheezing.  She has not used an inhaler in a long time, but she has albuterol available if needed.  She has chronic GERD which is well controlled with omeprazole.  She has never smoked or vaped.  Her recent past medical history is notable for urinary tract infections, which she gets about 1 year.  In July 2020 she was hospitalized for a week at [facility] for severe UTI.  She was discharged to rehab, and has since returned home.  Her family thinks that she developed a UTI from her immobility from her rheumatoid arthritis-especially related to bilateral knee pain.  She was prescribed nitrofurantoin a few weeks ago for urinary tract infection, but in the past has taken other antibiotics.   Past Medical History:  Diagnosis Date   Acute renal insufficiency    Asthma    Chronic bronchitis    COPD (chronic obstructive pulmonary disease) (HCC)    Depression    History of migraine headaches    Hyperlipemia    OSA (obstructive sleep apnea)    pt states she doesn't have it   Osteoarthritis    Pneumonia    Rheumatoid arthritis(714.0)    RLS (restless legs syndrome)    Vitamin D deficiency disease      Family History  Problem Relation Age of Onset   Lung cancer Father    Stroke Mother    Hypertension Mother    Diabetes Mother    Rheum arthritis Sister    Rheum arthritis Sister      Past Surgical History:  Procedure Laterality Date   KNEE SURGERY Bilateral    when younger   TOTAL ABDOMINAL HYSTERECTOMY  06/1988    Social History   Socioeconomic History   Marital status: Married    Spouse name: Durene Fruits   Number of children: 3   Years of education: Not on file   Highest education level: Not on file  Occupational History   Occupation:  retired    Comment: homemaker  Tobacco Use   Smoking status: Passive Smoke Exposure - Never Smoker   Smokeless tobacco: Never Used  Scientific laboratory technician Use: Never used  Substance and Sexual Activity   Alcohol use: No   Drug use: No   Sexual activity: Not Currently    Birth control/protection: None    Comment: Married  Other Topics Concern   Not on file  Social History Narrative   Lives with her husband, who is very helpful and supportive.   Social Determinants of Health   Financial Resource Strain: Low Risk    Difficulty of Paying Living Expenses: Not hard at all  Food Insecurity: No Food Insecurity   Worried About Charity fundraiser in the Last Year: Never true  Ran Out of Food in the Last Year: Never true  Transportation Needs: No Transportation Needs   Lack of Transportation (Medical): No   Lack of Transportation (Non-Medical): No  Physical Activity: Sufficiently Active   Days of Exercise per Week: 7 days   Minutes of Exercise per Session: 60 min  Stress: No Stress Concern Present   Feeling of Stress : Only a little  Social Connections: Engineer, building services of Communication with Friends and Family: More than three times a week   Frequency of Social Gatherings with Friends and Family: Once a week   Attends Religious Services: More than 4 times per year   Active Member of [employer] or Organizations: Yes   Attends Archivist Meetings: 1 to 4 times per year   Marital Status: Married  Human resources officer Violence: Not At Risk   Fear of Current or Ex-Partner: No   Emotionally Abused: No   Physically Abused: No   Sexually Abused: No     Allergies  Allergen Reactions   Sulfa Antibiotics Nausea And Vomiting   Actemra [Tocilizumab]    Amlodipine    Celebrex [Celecoxib]    Methotrexate Derivatives Other (See Comments)    sweating   Niaspan [Niacin]     Severe flushing   Remicade [Infliximab] Nausea And Vomiting and Other  (See Comments)    Affects nervous system   Welchol [Colesevelam] Other (See Comments)    Abdominal pain and headache     Immunization History  Administered Date(s) Administered   Influenza Whole 08/12/2011   Influenza, High Dose Seasonal PF 08/12/2019   Moderna SARS-COVID-2 Vaccination 11/26/2019, 12/24/2019    Outpatient Medications Prior to Visit  Medication Sig Dispense Refill   albuterol (VENTOLIN HFA) 108 (90 BASE) MCG/ACT inhaler Inhale 1-2 puffs into the lungs every 4 (four) hours as needed for shortness of breath.      atorvastatin (LIPITOR) 40 MG tablet Take 40 mg by mouth daily.      cloNIDine (CATAPRES) 0.1 MG tablet Take 1 tablet by mouth twice daily 180 tablet 0   diphenhydramine-acetaminophen (TYLENOL PM) 25-500 MG TABS tablet Take 1 tablet by mouth at bedtime as needed.     estradiol (ESTRACE VAGINAL) 0.1 MG/GM vaginal cream      fluticasone (FLONASE) 50 MCG/ACT nasal spray Place 2 sprays into both nostrils daily. (Patient taking differently: Place 2 sprays into both nostrils daily as needed for allergies. ) 16 g 6   furosemide (LASIX) 40 MG tablet Take 40 mg by mouth daily as needed.      gabapentin (NEURONTIN) 300 MG capsule Take 1 capsule (300 mg total) by mouth 2 (two) times daily for 180 doses. 180 capsule 1   HYDROcodone-acetaminophen (NORCO/VICODIN) 5-325 MG tablet Take 1 tablet by mouth every 6 (six) hours as needed for moderate pain. 120 tablet 0   hydroxychloroquine (PLAQUENIL) 200 MG tablet Take 1 tablet (200 mg total) by mouth 2 (two) times daily. 60 tablet 2   lisinopril (ZESTRIL) 10 MG tablet Take 1 tablet by mouth once daily 90 tablet 0   nitrofurantoin, macrocrystal-monohydrate, (MACROBID) 100 MG capsule Take 1 capsule (100 mg total) by mouth 2 (two) times daily. 14 capsule 0   omeprazole (PRILOSEC) 40 MG capsule Take 1 capsule (40 mg total) by mouth 2 (two) times daily. 180 capsule 3   PARoxetine (PAXIL-CR) 25 MG 24 hr tablet Take 1 tablet  by mouth once daily 90 tablet 1   polyethylene glycol (MIRALAX / GLYCOLAX) 17  g packet Take 17 g by mouth daily. Use daily as needed for constipation     rOPINIRole (REQUIP) 1 MG tablet Take 1 mg by mouth 2 (two) times daily. Take 1 tablet every morning and 2 at bedtime     spironolactone (ALDACTONE) 25 MG tablet      Tiotropium Bromide Monohydrate (SPIRIVA RESPIMAT) 2.5 MCG/ACT AERS Inhale 2 puffs into the lungs daily. 4 g 3   Tofacitinib Citrate (XELJANZ) 5 MG TABS Take 5 mg by mouth daily.      Vitamin D, Ergocalciferol, (DRISDOL) 1.25 MG (50000 UNIT) CAPS capsule Take 1 capsule by mouth once a week (Patient taking differently: Take 50,000 Units by mouth every Wednesday. ) 12 capsule 0   No facility-administered medications prior to visit.    Review of Systems  Constitutional: Negative for chills, diaphoresis, fever, malaise/fatigue and weight loss.       Just recovering from UTI  HENT: Negative for congestion, ear pain and sore throat.   Respiratory: Negative for cough, hemoptysis, sputum production, shortness of breath and wheezing.   Cardiovascular: Positive for leg swelling. Negative for chest pain and palpitations.  Gastrointestinal: Negative for abdominal pain, blood in stool, heartburn and nausea.  Genitourinary: Positive for frequency. Negative for hematuria.  Musculoskeletal: Positive for joint pain and myalgias.       Due to the RA  Skin: Negative for itching and rash.  Neurological: Positive for weakness. Negative for dizziness and headaches.       Global weakness  Endo/Heme/Allergies: Negative for environmental allergies. Does not bruise/bleed easily.  Psychiatric/Behavioral: Negative for depression. The patient is nervous/anxious.      Objective:   There were no vitals filed for this visit.   on  RA BMI Readings from Last 3 Encounters:  03/10/20 37.30 kg/m  02/25/20 37.15 kg/m  12/27/19 39.10 kg/m   Wt Readings from Last 3 Encounters:  03/10/20 191 lb  (86.6 kg)  02/25/20 190 lb 3.2 oz (86.3 kg)  12/27/19 193 lb 9.6 oz (87.8 kg)    Physical Exam-limited due to televisit. No conversational dyspnea.  Speaking in full sentences. Answering questions appropriately.  CBC    Component Value Date/Time   WBC 7.6 08/23/2013 1829   RBC 4.50 08/23/2013 1829   HGB 12.9 08/23/2013 1829   HCT 39.5 08/23/2013 1829   PLT 161 08/23/2013 1829   MCV 87.8 08/23/2013 1829   MCH 28.7 08/23/2013 1829   MCHC 32.7 08/23/2013 1829   RDW 14.2 08/23/2013 1829   LYMPHSABS 1.5 08/23/2013 1829   MONOABS 0.6 08/23/2013 1829   EOSABS 0.3 08/23/2013 1829   BASOSABS 0.1 08/23/2013 1829     Chest Imaging- films reviewed: CXR, 2- view 03/18/2011: Low lung volumes, R>L small granulomas. No masses or opacities.  X-ray C-spine, flexion and extension-report reviewed: Degenerative changes, most prominent in the lower cervical spine.  Slight anterolisthesis of C4 on C5.  Normal C1-C2 joint.  HRCT chest 09/08/2019 -subpleural reticular changes and mild honeycombing in upper (mostly right upper) and lower lobes.  Calcified granuloma lateral right middle lobe.  Mild airway thickening.  Few subcentimeter subpleural nodules, new since 2016.  Pulmonary Functions Testing Results: PFT Results Latest Ref Rng & Units 03/10/2020  FVC-Pre L 1.56  FVC-Predicted Pre % 70  FVC-Post L 1.60  FVC-Predicted Post % 71  Pre FEV1/FVC % % 66  Post FEV1/FCV % % 67  FEV1-Pre L 1.03  FEV1-Predicted Pre % 62  FEV1-Post L 1.07  DLCO UNC% % 71  DLCO COR %Predicted % 98  TLC L 4.23  TLC % Predicted % 94  RV % Predicted % 109   03/10/2020: Moderate obstruction without bronchodilator reversibility.  No significant restriction.  Mild diffusion impairment.  Flow volume loop supports mixed obstruction and restriction.  Significant decline in FVC, FEV1, diffusion capacity compared to 2012 PFTs.  04/10/2011: FVC 2.64 (92%)--> 2.63 (92%, 0% change) FEV1 1.55 L (76%) --> 1.57 L (77%, 1%  change) Ratio 59 RV 1.8 (94%)  TLC 4.44 (92%) via nitrogen washout DLCO 20.3 (83%)  flow volume loop consistent with obstruction -Mild obstruction, no significant restriction or diffusion impairment.      Assessment & Plan:   No diagnosis found.  COPD; never smoker.  Question of this is chronic bronchiolitis from RA versus asthma.  Improved on Spiriva. -Continue Spiriva once daily.  Can escalate to Stiolto once daily if symptoms are worsening or she has significant wheezing. -Needs annual flu vaccination.  Up-to-date on Covid vaccines. -Need to assess pneumonia vaccines-she may need both.  Mildly immunosuppressed due to RA treatment; uncontrolled synovitis.  Concern for RA-ILD rather than MTX lung toxicity.  -Ok to restart MTX if needed. Would get PFTs 3 months after starting for surveillance.  Most recent PFTs June 2021 as baseline. -Repeat HRCT chest in 1 year; may require antifibrotics if she has progressive fibrosis.  Unfortunately she has significant side effects of medications that limit her tolerability, and she may not do well with antifibrotic's. -Recommend against any future nitrofurantoin use. -Up-to-date on seasonal flu, covid, and pneumococcal vaccinations.  Pulmonary nodules, possibly RA associated nodules -Follow up CT scan ordered.  RTC in 3 months.   Current Outpatient Medications:    albuterol (VENTOLIN HFA) 108 (90 BASE) MCG/ACT inhaler, Inhale 1-2 puffs into the lungs every 4 (four) hours as needed for shortness of breath. , Disp: , Rfl:    atorvastatin (LIPITOR) 40 MG tablet, Take 40 mg by mouth daily. , Disp: , Rfl:    cloNIDine (CATAPRES) 0.1 MG tablet, Take 1 tablet by mouth twice daily, Disp: 180 tablet, Rfl: 0   diphenhydramine-acetaminophen (TYLENOL PM) 25-500 MG TABS tablet, Take 1 tablet by mouth at bedtime as needed., Disp: , Rfl:    estradiol (ESTRACE VAGINAL) 0.1 MG/GM vaginal cream, , Disp: , Rfl:    fluticasone (FLONASE) 50 MCG/ACT nasal  spray, Place 2 sprays into both nostrils daily. (Patient taking differently: Place 2 sprays into both nostrils daily as needed for allergies. ), Disp: 16 g, Rfl: 6   furosemide (LASIX) 40 MG tablet, Take 40 mg by mouth daily as needed. , Disp: , Rfl:    gabapentin (NEURONTIN) 300 MG capsule, Take 1 capsule (300 mg total) by mouth 2 (two) times daily for 180 doses., Disp: 180 capsule, Rfl: 1   HYDROcodone-acetaminophen (NORCO/VICODIN) 5-325 MG tablet, Take 1 tablet by mouth every 6 (six) hours as needed for moderate pain., Disp: 120 tablet, Rfl: 0   hydroxychloroquine (PLAQUENIL) 200 MG tablet, Take 1 tablet (200 mg total) by mouth 2 (two) times daily., Disp: 60 tablet, Rfl: 2   lisinopril (ZESTRIL) 10 MG tablet, Take 1 tablet by mouth once daily, Disp: 90 tablet, Rfl: 0   nitrofurantoin, macrocrystal-monohydrate, (MACROBID) 100 MG capsule, Take 1 capsule (100 mg total) by mouth 2 (two) times daily., Disp: 14 capsule, Rfl: 0   omeprazole (PRILOSEC) 40 MG capsule, Take 1 capsule (40 mg total) by mouth 2 (two) times daily., Disp: 180 capsule, Rfl: 3   PARoxetine (PAXIL-CR)  25 MG 24 hr tablet, Take 1 tablet by mouth once daily, Disp: 90 tablet, Rfl: 1   polyethylene glycol (MIRALAX / GLYCOLAX) 17 g packet, Take 17 g by mouth daily. Use daily as needed for constipation, Disp: , Rfl:    rOPINIRole (REQUIP) 1 MG tablet, Take 1 mg by mouth 2 (two) times daily. Take 1 tablet every morning and 2 at bedtime, Disp: , Rfl:    spironolactone (ALDACTONE) 25 MG tablet, , Disp: , Rfl:    Tiotropium Bromide Monohydrate (SPIRIVA RESPIMAT) 2.5 MCG/ACT AERS, Inhale 2 puffs into the lungs daily., Disp: 4 g, Rfl: 3   Tofacitinib Citrate (XELJANZ) 5 MG TABS, Take 5 mg by mouth daily. , Disp: , Rfl:    Vitamin D, Ergocalciferol, (DRISDOL) 1.25 MG (50000 UNIT) CAPS capsule, Take 1 capsule by mouth once a week (Patient taking differently: Take 50,000 Units by mouth every Wednesday. ), Disp: 12 capsule, Rfl:  0   Julian Hy, DO [facility] 05/17/2020 4:12 PM

## 2020-05-17 NOTE — Patient Instructions (Addendum)
Thank you for visiting Dr. Carlis Abbott at [facility]. We recommend the following:  Keep taking Spiriva once daily.  Meds ordered this encounter  Medications   albuterol (VENTOLIN HFA) 108 (90 Base) MCG/ACT inhaler    Sig: Inhale 2 puffs into the lungs every 6 (six) hours as needed for wheezing or shortness of breath.    Dispense:  18 g    Refill:  11    Return in about 3 months (around 08/17/2020).    Please do your part to reduce the spread of COVID-19.

## 2020-05-30 ENCOUNTER — Ambulatory Visit: Payer: Medicare Other | Admitting: Family Medicine

## 2020-05-31 DIAGNOSIS — H527 Unspecified disorder of refraction: Secondary | ICD-10-CM | POA: Diagnosis not present

## 2020-05-31 DIAGNOSIS — Z961 Presence of intraocular lens: Secondary | ICD-10-CM | POA: Diagnosis not present

## 2020-05-31 DIAGNOSIS — H04129 Dry eye syndrome of unspecified lacrimal gland: Secondary | ICD-10-CM | POA: Diagnosis not present

## 2020-05-31 DIAGNOSIS — M1711 Unilateral primary osteoarthritis, right knee: Secondary | ICD-10-CM | POA: Diagnosis not present

## 2020-05-31 DIAGNOSIS — H532 Diplopia: Secondary | ICD-10-CM | POA: Diagnosis not present

## 2020-06-05 DIAGNOSIS — M150 Primary generalized (osteo)arthritis: Secondary | ICD-10-CM | POA: Diagnosis not present

## 2020-06-05 DIAGNOSIS — M48061 Spinal stenosis, lumbar region without neurogenic claudication: Secondary | ICD-10-CM | POA: Diagnosis not present

## 2020-06-05 DIAGNOSIS — J849 Interstitial pulmonary disease, unspecified: Secondary | ICD-10-CM | POA: Diagnosis not present

## 2020-06-05 DIAGNOSIS — M0579 Rheumatoid arthritis with rheumatoid factor of multiple sites without organ or systems involvement: Secondary | ICD-10-CM | POA: Diagnosis not present

## 2020-06-05 DIAGNOSIS — M2550 Pain in unspecified joint: Secondary | ICD-10-CM | POA: Diagnosis not present

## 2020-06-05 DIAGNOSIS — Z79899 Other long term (current) drug therapy: Secondary | ICD-10-CM | POA: Diagnosis not present

## 2020-06-05 DIAGNOSIS — Z13220 Encounter for screening for lipoid disorders: Secondary | ICD-10-CM | POA: Diagnosis not present

## 2020-06-22 ENCOUNTER — Other Ambulatory Visit: Payer: Self-pay | Admitting: Physician Assistant

## 2020-06-22 ENCOUNTER — Other Ambulatory Visit: Payer: Self-pay | Admitting: Family Medicine

## 2020-06-26 DIAGNOSIS — M1712 Unilateral primary osteoarthritis, left knee: Secondary | ICD-10-CM | POA: Diagnosis not present

## 2020-06-27 ENCOUNTER — Other Ambulatory Visit: Payer: Self-pay

## 2020-06-27 MED ORDER — HYDROCODONE-ACETAMINOPHEN 5-325 MG PO TABS: 1.0000 | ORAL_TABLET | Freq: Four times a day (QID) | ORAL | 0 refills | Status: DC | PRN

## 2020-07-03 ENCOUNTER — Telehealth: Payer: Self-pay | Admitting: Critical Care Medicine

## 2020-07-03 MED ORDER — DOXYCYCLINE HYCLATE 100 MG PO TABS
100.0000 mg | ORAL_TABLET | Freq: Two times a day (BID) | ORAL | 0 refills | Status: DC
Start: 2020-07-03 — End: 2020-12-14

## 2020-07-03 NOTE — Telephone Encounter (Signed)
Primary Pulmonologist: Carlis Abbott Last office visit and with whom: 05/17/20, Dr. Carlis Abbott What do we see them for (pulmonary problems): ILD Last OV assessment/plan:  No diagnosis found.  COPD; never smoker.  Question of this is chronic bronchiolitis from RA versus asthma.  Improved on Spiriva. -Continue Spiriva once daily.  Can escalate to Stiolto once daily if symptoms are worsening or she has significant wheezing. -Needs annual flu vaccination.  Up-to-date on Covid vaccines. -Need to assess pneumonia vaccines-she may need both.  Mildly immunosuppressed due to RA treatment; uncontrolled synovitis.  Concern for RA-ILD rather than MTX lung toxicity.  -Ok to restart MTX if needed. Would get PFTs 3 months after starting for surveillance.  Most recent PFTs June 2021 as baseline. -Repeat HRCT chest in 1 year; may require antifibrotics if she has progressive fibrosis.  Unfortunately she has significant side effects of medications that limit her tolerability, and she may not do well with antifibrotic's. -Recommend against any future nitrofurantoin use. -Up-to-date on seasonal flu, covid, and pneumococcal vaccinations.  Pulmonary nodules, possibly RA associated nodules -Follow up CT scan ordered.  RTC in 3 months.   Current Outpatient Medications:  .  albuterol (VENTOLIN HFA) 108 (90 BASE) MCG/ACT inhaler, Inhale 1-2 puffs into the lungs every 4 (four) hours as needed for shortness of breath. , Disp: , Rfl:  .  atorvastatin (LIPITOR) 40 MG tablet, Take 40 mg by mouth daily. , Disp: , Rfl:  .  cloNIDine (CATAPRES) 0.1 MG tablet, Take 1 tablet by mouth twice daily, Disp: 180 tablet, Rfl: 0 .  diphenhydramine-acetaminophen (TYLENOL PM) 25-500 MG TABS tablet, Take 1 tablet by mouth at bedtime as needed., Disp: , Rfl:  .  estradiol (ESTRACE VAGINAL) 0.1 MG/GM vaginal cream, , Disp: , Rfl:  .  fluticasone (FLONASE) 50 MCG/ACT nasal spray, Place 2 sprays into both nostrils daily. (Patient taking  differently: Place 2 sprays into both nostrils daily as needed for allergies. ), Disp: 16 g, Rfl: 6 .  furosemide (LASIX) 40 MG tablet, Take 40 mg by mouth daily as needed. , Disp: , Rfl:  .  gabapentin (NEURONTIN) 300 MG capsule, Take 1 capsule (300 mg total) by mouth 2 (two) times daily for 180 doses., Disp: 180 capsule, Rfl: 1 .  HYDROcodone-acetaminophen (NORCO/VICODIN) 5-325 MG tablet, Take 1 tablet by mouth every 6 (six) hours as needed for moderate pain., Disp: 120 tablet, Rfl: 0 .  hydroxychloroquine (PLAQUENIL) 200 MG tablet, Take 1 tablet (200 mg total) by mouth 2 (two) times daily., Disp: 60 tablet, Rfl: 2 .  lisinopril (ZESTRIL) 10 MG tablet, Take 1 tablet by mouth once daily, Disp: 90 tablet, Rfl: 0 .  nitrofurantoin, macrocrystal-monohydrate, (MACROBID) 100 MG capsule, Take 1 capsule (100 mg total) by mouth 2 (two) times daily., Disp: 14 capsule, Rfl: 0 .  omeprazole (PRILOSEC) 40 MG capsule, Take 1 capsule (40 mg total) by mouth 2 (two) times daily., Disp: 180 capsule, Rfl: 3 .  PARoxetine (PAXIL-CR) 25 MG 24 hr tablet, Take 1 tablet by mouth once daily, Disp: 90 tablet, Rfl: 1 .  polyethylene glycol (MIRALAX / GLYCOLAX) 17 g packet, Take 17 g by mouth daily. Use daily as needed for constipation, Disp: , Rfl:  .  rOPINIRole (REQUIP) 1 MG tablet, Take 1 mg by mouth 2 (two) times daily. Take 1 tablet every morning and 2 at bedtime, Disp: , Rfl:  .  spironolactone (ALDACTONE) 25 MG tablet, , Disp: , Rfl:  .  Tiotropium Bromide Monohydrate (SPIRIVA RESPIMAT) 2.5 MCG/ACT  AERS, Inhale 2 puffs into the lungs daily., Disp: 4 g, Rfl: 3 .  Tofacitinib Citrate (XELJANZ) 5 MG TABS, Take 5 mg by mouth daily. , Disp: , Rfl:  .  Vitamin D, Ergocalciferol, (DRISDOL) 1.25 MG (50000 UNIT) CAPS capsule, Take 1 capsule by mouth once a week (Patient taking differently: Take 50,000 Units by mouth every Wednesday. ), Disp: 12 capsule, Rfl: 0   Julian Hy, DO [facility] 05/17/2020  4:12 PM     Patient Instructions by Julian Hy, DO at 05/17/2020 4:30 PM Author: Julian Hy, DO Author Type: Physician Filed: 05/17/2020 4:21 PM  Note Status: Addendum Cosign: Cosign Not Required Encounter Date: 05/17/2020  Editor: Julian Hy, DO (Physician)      Prior Versions: 1. Julian Hy, DO (Physician) at 05/17/2020 4:20 PM - Signed    Thank you for visiting Dr. Carlis Abbott at [facility]. We recommend the following:  Keep taking Spiriva once daily.      Meds ordered this encounter  Medications  . albuterol (VENTOLIN HFA) 108 (90 Base) MCG/ACT inhaler    Sig: Inhale 2 puffs into the lungs every 6 (six) hours as needed for wheezing or shortness of breath.    Dispense:  18 g    Refill:  11    Return in about 3 months (around 08/17/2020).    Please do your part to reduce the spread of COVID-19.     Instructions    Return in about 3 months (around 08/17/2020). Thank you for visiting Dr. Carlis Abbott at [facility]. We recommend the following:  Keep taking Spiriva once daily.      Meds ordered this encounter  Medications  . albuterol (VENTOLIN HFA) 108 (90 Base) MCG/ACT inhaler    Sig: Inhale 2 puffs into the lungs every 6 (six) hours as needed for wheezing or shortness of breath.    Dispense:  18 g    Refill:  11    Return in about 3 months (around 08/17/2020).       Was appointment offered to patient (explain)?  no   Reason for call: Cough for 1 month.  Coughs with very little exertion and coughs up very little phlem, yellow and sometimes frothy.  Oxygen level is 93% on RA sitting still.  Using nebs, but getting very little relief.  Feels like she has a bronchial infection.  (examples of things to ask: : When did symptoms start? 1 month ago Fever? No, states she has had chills, could not get warm yesterday, but this happens sometimes to her. Cough? Yes Productive? Very little Color to sputum? Yellow, sometimes frothy More sputum than  usual?no  Wheezing? No Have you needed increased oxygen? No, no oxygen.  Are you taking your respiratory medications? Yes, Spiriva, states cough started after she began Spiriva.  What over the counter measures have you tried?  Adult [place]  Allergies  Allergen Reactions  . Sulfa Antibiotics Nausea And Vomiting  . Actemra [Tocilizumab]   . Amlodipine   . Celebrex [Celecoxib]   . Methotrexate Derivatives Other (See Comments)    sweating  . Niaspan [Niacin]     Severe flushing  . Remicade [Infliximab] Nausea And Vomiting and Other (See Comments)    Affects nervous system  . Welchol [Colesevelam] Other (See Comments)    Abdominal pain and headache    Immunization History  Administered Date(s) Administered  . Influenza Whole 08/12/2011  . Influenza, High Dose Seasonal PF 08/12/2019  .  Moderna SARS-COVID-2 Vaccination 11/26/2019, 12/24/2019

## 2020-07-03 NOTE — Telephone Encounter (Signed)
It is hard to evaluate this fully without seeing her. Recommend if she thinks she has an infection that she be covid tested. This could somewhat be due to progressive lung disease. She and I have previously discussed if she would need treatment for her fibrosis at any point. We can try a course of doxycycline for 7 days, but if this does not resolve her symptoms, she needs to be seen and evaluated in person.  Julian Hy, DO 07/03/20 4:58 PM [facility]

## 2020-07-03 NOTE — Telephone Encounter (Signed)
Spoke with patient and provided instructions per Dr. Carlis Abbott.  She verbalized understanding.  Verified pharmacy with patient and Doxycycline sent to pharmacy.  .  She will call and schedule an ov if she is in feeling better after Doxycycline.  Nothing further needed.

## 2020-07-06 DIAGNOSIS — M1711 Unilateral primary osteoarthritis, right knee: Secondary | ICD-10-CM | POA: Diagnosis not present

## 2020-08-01 ENCOUNTER — Other Ambulatory Visit: Payer: Self-pay

## 2020-08-01 ENCOUNTER — Encounter: Payer: Self-pay | Admitting: Pulmonary Disease

## 2020-08-01 ENCOUNTER — Ambulatory Visit (INDEPENDENT_AMBULATORY_CARE_PROVIDER_SITE_OTHER): Payer: Medicare Other | Admitting: Pulmonary Disease

## 2020-08-01 VITALS — BP 130/70 | HR 69 | Temp 97.3°F | Ht 60.0 in | Wt 192.0 lb

## 2020-08-01 DIAGNOSIS — Z0000 Encounter for general adult medical examination without abnormal findings: Secondary | ICD-10-CM | POA: Diagnosis not present

## 2020-08-01 DIAGNOSIS — Z23 Encounter for immunization: Secondary | ICD-10-CM

## 2020-08-01 DIAGNOSIS — M05712 Rheumatoid arthritis with rheumatoid factor of left shoulder without organ or systems involvement: Secondary | ICD-10-CM

## 2020-08-01 DIAGNOSIS — G4733 Obstructive sleep apnea (adult) (pediatric): Secondary | ICD-10-CM | POA: Diagnosis not present

## 2020-08-01 DIAGNOSIS — M05711 Rheumatoid arthritis with rheumatoid factor of right shoulder without organ or systems involvement: Secondary | ICD-10-CM

## 2020-08-01 DIAGNOSIS — J849 Interstitial pulmonary disease, unspecified: Secondary | ICD-10-CM | POA: Diagnosis not present

## 2020-08-01 DIAGNOSIS — J410 Simple chronic bronchitis: Secondary | ICD-10-CM | POA: Diagnosis not present

## 2020-08-01 MED ORDER — STIOLTO RESPIMAT 2.5-2.5 MCG/ACT IN AERS: 2.0000 | INHALATION_SPRAY | Freq: Every day | RESPIRATORY_TRACT | 0 refills | Status: DC

## 2020-08-01 NOTE — Progress Notes (Signed)
@Patient  ID: Sherry Burke, female    DOB: 1940-12-29, 79 y.o.   MRN: [id number]  Chief Complaint  Patient presents with   Follow-up    ILD, breathing is worse with exertion    Referring provider: [name], MD  HPI:  79 year old female former smoker followed in our office for COPD, interstitial lung disease  PMH: Restless leg, GERD, dyslipidemia, hypertension, depression Smoker/ Smoking History: Former smoker Maintenance: Spiriva Respimat 2.5 Pt of: Dr. Carlis Abbott  08/01/2020  - Visit   79 year old female former smoker upon our office for COPD and interstitial lung disease she was last seen by Dr. Carlis Abbott in July/2021 this was a virtual visit plan of care from that visit was as follows to continue Spiriva once daily.  Could consider escalating to Stiolto if symptoms worsen.  Patient was encouraged to restart methotrexate.  Plan will be to obtain pulmonary function testing 3 months after starting for surveillance.  Plan was to repeat a high-resolution CT chest in 1 year.  She was recommended to follow-up in 3 months.  Patient reports that she never restarted methotrexate she is managed by Dr. Trudie Reed with rheumatology for her rheumatoid arthritis.  She is currently managed on [place].  She is only taking 1 tablet daily due to GI upset.  They feel that this is adequately controlling her rheumatoid arthritis at this time.  Patient simply wanted to know whether or not she could restart methotrexate if needed.  She is a follow-up with their office in 1 month.  Patient continues to lead a fairly sedentary lifestyle.  She also struggles with back pain.  This limits her overall physical mobility.  She tries to do chair exercises.  She plans on obtaining the COVID-19 booster when recommended to her.  She plans on obtaining the seasonal flu vaccine when recommended to her.  She does not feel the Spiriva was very helpful in management of her shortness of breath.  She would like to discuss this  today.  Questionaires / Pulmonary Flowsheets:   ACT:  No flowsheet data found.  MMRC: mMRC Dyspnea Scale mMRC Score  08/01/2020 3    Epworth:  No flowsheet data found.  Tests:   Chest Imaging: CXR, 2- view 03/18/2011: Low lung volumes, R>L small granulomas. No masses or opacities.  X-ray C-spine, flexion and extension-report reviewed: Degenerative changes, most prominent in the lower cervical spine.  Slight anterolisthesis of C4 on C5.  Normal C1-C2 joint.  HRCT chest 09/08/2019 -subpleural reticular changes and mild honeycombing in upper (mostly right upper) and lower lobes.  Calcified granuloma lateral right middle lobe.  Mild airway thickening.  Few subcentimeter subpleural nodules, new since 2016.  Pulmonary Functions Testing Results: PFT Results Latest Ref Rng & Units 03/10/2020  FVC-Pre L 1.56  FVC-Predicted Pre % 70  FVC-Post L 1.60  FVC-Predicted Post % 71  Pre FEV1/FVC % % 66  Post FEV1/FCV % % 67  FEV1-Pre L 1.03  FEV1-Predicted Pre % 62  FEV1-Post L 1.07  DLCO UNC% % 71  DLCO COR %Predicted % 98  TLC L 4.23  TLC % Predicted % 94  RV % Predicted % 109   03/10/2020: Moderate obstruction without bronchodilator reversibility.  No significant restriction.  Mild diffusion impairment.  Flow volume loop supports mixed obstruction and restriction.  Significant decline in FVC, FEV1, diffusion capacity compared to 2012 PFTs.  04/10/2011: FVC 2.64 (92%)--> 2.63 (92%, 0% change) FEV1 1.55 L (76%) --> 1.57 L (77%, 1% change) Ratio 59 RV 1.8 (  94%)  TLC 4.44 (92%) via nitrogen washout DLCO 20.3 (83%)  flow volume loop consistent with obstruction -Mild obstruction, no significant restriction or diffusion impairment.    FENO:  No results found for: NITRICOXIDE  PFT: PFT Results Latest Ref Rng & Units 03/10/2020  FVC-Pre L 1.56  FVC-Predicted Pre % 70  FVC-Post L 1.60  FVC-Predicted Post % 71  Pre FEV1/FVC % % 66  Post FEV1/FCV % % 67  FEV1-Pre L 1.03   FEV1-Predicted Pre % 62  FEV1-Post L 1.07  DLCO uncorrected ml/min/mmHg 11.82  DLCO UNC% % 71  DLCO corrected ml/min/mmHg 11.82  DLCO COR %Predicted % 71  DLVA Predicted % 98  TLC L 4.23  TLC % Predicted % 94  RV % Predicted % 109    WALK:  No flowsheet data found.  Imaging: No results found.  Lab Results:  CBC    Component Value Date/Time   WBC 7.6 08/23/2013 1829   RBC 4.50 08/23/2013 1829   HGB 12.9 08/23/2013 1829   HCT 39.5 08/23/2013 1829   PLT 161 08/23/2013 1829   MCV 87.8 08/23/2013 1829   MCH 28.7 08/23/2013 1829   MCHC 32.7 08/23/2013 1829   RDW 14.2 08/23/2013 1829   LYMPHSABS 1.5 08/23/2013 1829   MONOABS 0.6 08/23/2013 1829   EOSABS 0.3 08/23/2013 1829   BASOSABS 0.1 08/23/2013 1829    BMET    Component Value Date/Time   NA 144 08/23/2013 1829   K 4.6 08/23/2013 1829   CL 106 08/23/2013 1829   CO2 26 08/23/2013 1829   GLUCOSE 90 08/23/2013 1829   BUN 24 (H) 08/23/2013 1829   CREATININE 0.77 08/23/2013 1829   CALCIUM 9.3 08/23/2013 1829   GFRNONAA 83 (L) 08/23/2013 1829   GFRAA >90 08/23/2013 1829    BNP No results found for: BNP  ProBNP No results found for: PROBNP  Specialty Problems      Pulmonary Problems   COPD (chronic obstructive pulmonary disease) ([place])    CXR 03/2011:  Small calcified granulomas bilat, calcified left hilar nodes, no ISLD or acute process.  PFT's 03/2011:  FEV1 1.55 (76%), ratio 59, TLC and DLCO normal      OSA (obstructive sleep apnea)    NPSG 2011:  AHI 25/hr Wore cpap 3 mos only.      ILD (interstitial lung disease) (HCC)      Allergies  Allergen Reactions   Sulfa Antibiotics Nausea And Vomiting   Actemra [Tocilizumab]    Amlodipine    Celebrex [Celecoxib]    Methotrexate Derivatives Other (See Comments)    sweating   Niaspan [Niacin]     Severe flushing   Remicade [Infliximab] Nausea And Vomiting and Other (See Comments)    Affects nervous system   Welchol [Colesevelam] Other (See  Comments)    Abdominal pain and headache    Immunization History  Administered Date(s) Administered   Influenza Whole 08/12/2011   Influenza, High Dose Seasonal PF 08/12/2019, 08/01/2020   Moderna SARS-COVID-2 Vaccination 11/26/2019, 12/24/2019    Past Medical History:  Diagnosis Date   Acute renal insufficiency    Asthma    Chronic bronchitis    COPD (chronic obstructive pulmonary disease) (HCC)    Depression    History of migraine headaches    Hyperlipemia    OSA (obstructive sleep apnea)    pt states she doesn't have it   Osteoarthritis    Pneumonia    Rheumatoid arthritis(714.0)    RLS (restless legs syndrome)  Vitamin D deficiency disease     Tobacco History: Social History   Tobacco Use  Smoking Status Passive Smoke Exposure - Never Smoker  Smokeless Tobacco Never Used   Counseling given: Yes   Continue to not smoke  Outpatient Encounter Medications as of 08/01/2020  Medication Sig   albuterol (VENTOLIN HFA) 108 (90 Base) MCG/ACT inhaler Inhale 2 puffs into the lungs every 6 (six) hours as needed for wheezing or shortness of breath.   atorvastatin (LIPITOR) 40 MG tablet Take 1 tablet by mouth once daily   cloNIDine (CATAPRES) 0.1 MG tablet Take 1 tablet by mouth twice daily   diphenhydramine-acetaminophen (TYLENOL PM) 25-500 MG TABS tablet Take 1 tablet by mouth at bedtime as needed.   doxycycline (VIBRA-TABS) 100 MG tablet Take 1 tablet (100 mg total) by mouth 2 (two) times daily.   estradiol (ESTRACE VAGINAL) 0.1 MG/GM vaginal cream    fluticasone (FLONASE) 50 MCG/ACT nasal spray Place 2 sprays into both nostrils daily. (Patient taking differently: Place 2 sprays into both nostrils daily as needed for allergies. )   furosemide (LASIX) 40 MG tablet Take 40 mg by mouth daily as needed.    gabapentin (NEURONTIN) 300 MG capsule Take 1 capsule by mouth twice daily   HYDROcodone-acetaminophen (NORCO/VICODIN) 5-325 MG tablet Take 1  tablet by mouth every 6 (six) hours as needed for moderate pain.   hydroxychloroquine (PLAQUENIL) 200 MG tablet Take 1 tablet (200 mg total) by mouth 2 (two) times daily.   lisinopril (ZESTRIL) 10 MG tablet Take 1 tablet by mouth once daily   nitrofurantoin, macrocrystal-monohydrate, (MACROBID) 100 MG capsule Take 1 capsule (100 mg total) by mouth 2 (two) times daily.   omeprazole (PRILOSEC) 40 MG capsule Take 1 capsule (40 mg total) by mouth 2 (two) times daily.   ONETOUCH ULTRA test strip USE TO CHECK BLOOD SUGAR ONCE DAILY IN THE MORNING   PARoxetine (PAXIL-CR) 25 MG 24 hr tablet Take 1 tablet by mouth once daily   polyethylene glycol (MIRALAX / GLYCOLAX) 17 g packet Take 17 g by mouth daily. Use daily as needed for constipation   rOPINIRole (REQUIP) 1 MG tablet Take 1 mg by mouth 2 (two) times daily. Take 1 tablet every morning and 2 at bedtime   spironolactone (ALDACTONE) 25 MG tablet    Tiotropium Bromide Monohydrate (SPIRIVA RESPIMAT) 2.5 MCG/ACT AERS Inhale 2 puffs into the lungs daily.   Tofacitinib Citrate (XELJANZ) 5 MG TABS Take 5 mg by mouth daily.    Vitamin D, Ergocalciferol, (DRISDOL) 1.25 MG (50000 UNIT) CAPS capsule Take 1 capsule by mouth once a week (Patient taking differently: Take 50,000 Units by mouth every Wednesday. )   No facility-administered encounter medications on file as of 08/01/2020.     Review of Systems  Review of Systems  Constitutional: Positive for fatigue. Negative for activity change and fever.  HENT: Negative for sinus pressure, sinus pain and sore throat.   Respiratory: Positive for cough and shortness of breath. Negative for wheezing.   Cardiovascular: Negative for chest pain and palpitations.  Gastrointestinal: Negative for diarrhea, nausea and vomiting.  Musculoskeletal: Negative for arthralgias.  Neurological: Negative for dizziness.  Psychiatric/Behavioral: Negative for sleep disturbance. The patient is not nervous/anxious.       Physical Exam  BP 130/70 (BP Location: Left Arm, Cuff Size: Normal)    Pulse 69    Temp (!) 97.3 F (36.3 C) (Oral)    Ht 5' (1.524 m)    Wt 192 lb (87.1  kg)    SpO2 95%    BMI 37.50 kg/m   Wt Readings from Last 5 Encounters:  08/01/20 192 lb (87.1 kg)  03/10/20 191 lb (86.6 kg)  02/25/20 190 lb 3.2 oz (86.3 kg)  12/27/19 193 lb 9.6 oz (87.8 kg)  09/02/19 175 lb 9.6 oz (79.7 kg)    BMI Readings from Last 5 Encounters:  08/01/20 37.50 kg/m  03/10/20 37.30 kg/m  02/25/20 37.15 kg/m  12/27/19 39.10 kg/m  09/02/19 34.29 kg/m     Physical Exam Vitals and nursing note reviewed.  Constitutional:      General: She is not in acute distress.    Appearance: Normal appearance. She is obese.  HENT:     Head: Normocephalic and atraumatic.     Right Ear: External ear normal.     Left Ear: External ear normal.     Nose: Nose normal. No congestion.     Mouth/Throat:     Mouth: Mucous membranes are moist.     Pharynx: Oropharynx is clear.  Eyes:     Pupils: Pupils are equal, round, and reactive to light.  Cardiovascular:     Rate and Rhythm: Normal rate and regular rhythm.     Pulses: Normal pulses.     Heart sounds: Normal heart sounds. No murmur heard.   Pulmonary:     Effort: Pulmonary effort is normal. No respiratory distress.     Breath sounds: No decreased air movement. Wheezing present. No decreased breath sounds or rales.     Comments: Upper airway wheeze  Musculoskeletal:     Cervical back: Normal range of motion.  Skin:    General: Skin is warm and dry.     Capillary Refill: Capillary refill takes less than 2 seconds.  Neurological:     General: No focal deficit present.     Mental Status: She is alert and oriented to person, place, and time. Mental status is at baseline.     Gait: Gait abnormal.  Psychiatric:        Mood and Affect: Mood normal.        Behavior: Behavior normal.        Thought Content: Thought content normal.        Judgment: Judgment  normal.       Assessment & Plan:   OSA (obstructive sleep apnea) Known moderate effective sleep apnea based off of 2011 sleep study Patient intolerant of CPAP use She is not interested in resuming  Plan: We will continue to clinically monitor  ILD (interstitial lung disease) ([place]) Felt to be RA ILD Not currently taking methotrexate Previously has had case discussed at multidisciplinary ILD conference  Plan: Spirometry with DLCO in 3 months Establish with ILD specialist Dr. Vaughan Browner or Dr. Chase Caller in 3 months Continue follow-up with rheumatology  COPD (chronic obstructive pulmonary disease) Plan: Stop Spiriva Respimat Start Stiolto Respimat Notify our office how you are doing after 2 weeks of use Referral to pulmonary rehab, patient would prefer [facility] Spirometry with DLCO in 3 months Establish with new pulmonary provider-Dr. Vaughan Browner or Dr. Chase Caller in 3 months  Rheumatoid arthritis involving both shoulders with positive rheumatoid factor ([place]) Plan: Continue [name] Continue follow-up with rheumatology Spirometry with DLCO in 3 months  Healthcare maintenance Plan: High-dose flu vaccine today Patient to discuss whether not she would be a candidate for COVID-19 booster with rheumatology at next office visit    Return in about 3 months (around 10/31/2020), or if symptoms worsen or  fail to improve, for Follow up with Dr. Vaughan Browner, Follow up for PFT - 24min, Spiro with DLCO, 30 MINUTE SLOT.   Lauraine Rinne, NP 08/01/2020   This appointment required 34 minutes of patient care (this includes precharting, chart review, review of results, face-to-face care, etc.).

## 2020-08-01 NOTE — Patient Instructions (Addendum)
You were seen today by Lauraine Rinne, NP  for:   1. ILD (interstitial lung disease) ([place])  - Pulmonary function test; Future  We will refer you to pulmonary rehab to be completed in [place]  We will have you establish with an interstitial lung disease specialist either Dr. Chase Caller or Dr. Vaughan Browner in 3 months with a spirometry with DLCO  2. Rheumatoid arthritis involving both shoulders with positive rheumatoid factor (HCC)  Keep follow-up with rheumatology Dr. Trudie Reed  Discussed with rheumatology whether or not you would be a candidate for the COVID-19 booster vaccine  3. Simple chronic bronchitis (HCC)  Stop Spiriva Respimat 2.5  Trial of Stiolto Respimat inhaler >>>2 puffs daily >>>Take this no matter what >>>This is not a rescue inhaler  Let us know when you finish your first sample how you are doing on this.  If you feel like this is helpful we can send in a prescription.  We will refer you to pulmonary rehab today  Note your daily symptoms > remember "red flags" for COPD:   >>>Increase in cough >>>increase in sputum production >>>increase in shortness of breath or activity  intolerance.   If you notice these symptoms, please call the office to be seen.    4. OSA (obstructive sleep apnea)  As discussed today we may need to reevaluate untreated obstructive sleep apnea if you continue to have persistent shortness of breath despite our recommendations and interventions  5. Healthcare maintenance  Discuss with rheumatology whether or not you be candidate for the COVID-19 booster vaccination, if so please obtain  We recommend the high-dose seasonal flu vaccine in fall/2021  Please discuss with Dr. Tobie Poet whether or not you can be transitioned off of lisinopril as this medication can affect shortness of breath and COPD patients   We recommend today:  Orders Placed This Encounter  Procedures  . Pulmonary function test    Arlyce Harman with dlco    Standing Status:   Future     Standing Expiration Date:   08/01/2021    Scheduling Instructions:     3 mo from now    Order Specific Question:   Where should this test be performed?    Answer:   [facility]   Orders Placed This Encounter  Procedures  . Pulmonary function test   No orders of the defined types were placed in this encounter.   Follow Up:    Return in about 3 months (around 10/31/2020), or if symptoms worsen or fail to improve, for Follow up with Dr. Vaughan Browner, Follow up for PFT - 86min, Spiro with DLCO, 30 MINUTE SLOT. Former Dr. Carlis Abbott patient, needs to establish with either Dr. Vaughan Browner or Dr. Chase Caller and 30-minute time slot     Notification of test results are managed in the following manner: If there are  any recommendations or changes to the  plan of care discussed in office today,  we will contact you and let you know what they are. If you do not hear from [place], then your results are normal and you can view them through your  MyChart account , or a letter will be sent to you. Thank you again for trusting [place] with your care  - Thank you, [facility]    It is flu season:   >>> Best ways to protect herself from the flu: Receive the yearly flu vaccine, practice good hand hygiene washing with soap and also using hand sanitizer when available, eat a nutritious meals, get  adequate rest, hydrate appropriately       Please contact the office if your symptoms worsen or you have concerns that you are not improving.   Thank you for choosing [facility] for your healthcare, and for allowing [place] to partner with you on your healthcare journey. I am thankful to be able to provide care to you today.   Wyn Quaker FNP-C

## 2020-08-01 NOTE — Assessment & Plan Note (Signed)
Plan: Continue Sherry Burke Continue follow-up with rheumatology Spirometry with DLCO in 3 months

## 2020-08-01 NOTE — Assessment & Plan Note (Signed)
Plan: Stop Spiriva Respimat Start Stiolto Respimat Notify our office how you are doing after 2 weeks of use Referral to pulmonary rehab, patient would prefer [facility] Spirometry with DLCO in 3 months Establish with new pulmonary provider-Dr. Vaughan Browner or Dr. Chase Caller in 3 months

## 2020-08-01 NOTE — Assessment & Plan Note (Addendum)
Plan: High-dose flu vaccine today Patient to discuss whether not she would be a candidate for COVID-19 booster with rheumatology at next office visit

## 2020-08-01 NOTE — Assessment & Plan Note (Signed)
Known moderate effective sleep apnea based off of 2011 sleep study Patient intolerant of CPAP use She is not interested in resuming  Plan: We will continue to clinically monitor

## 2020-08-01 NOTE — Addendum Note (Signed)
Addended byCoralie Keens on: 08/01/2020 04:41 PM   Modules accepted: Orders

## 2020-08-01 NOTE — Assessment & Plan Note (Signed)
Felt to be RA ILD Not currently taking methotrexate Previously has had case discussed at multidisciplinary ILD conference  Plan: Spirometry with DLCO in 3 months Establish with ILD specialist Dr. Vaughan Browner or Dr. Chase Caller in 3 months Continue follow-up with rheumatology

## 2020-08-07 ENCOUNTER — Other Ambulatory Visit: Payer: Self-pay | Admitting: Physician Assistant

## 2020-08-07 ENCOUNTER — Other Ambulatory Visit: Payer: Self-pay | Admitting: Family Medicine

## 2020-08-22 DIAGNOSIS — J849 Interstitial pulmonary disease, unspecified: Secondary | ICD-10-CM | POA: Diagnosis not present

## 2020-08-22 DIAGNOSIS — J410 Simple chronic bronchitis: Secondary | ICD-10-CM | POA: Diagnosis not present

## 2020-08-23 DIAGNOSIS — J410 Simple chronic bronchitis: Secondary | ICD-10-CM | POA: Diagnosis not present

## 2020-08-23 DIAGNOSIS — J849 Interstitial pulmonary disease, unspecified: Secondary | ICD-10-CM | POA: Diagnosis not present

## 2020-08-25 DIAGNOSIS — J849 Interstitial pulmonary disease, unspecified: Secondary | ICD-10-CM | POA: Diagnosis not present

## 2020-08-25 DIAGNOSIS — J410 Simple chronic bronchitis: Secondary | ICD-10-CM | POA: Diagnosis not present

## 2020-08-28 DIAGNOSIS — J849 Interstitial pulmonary disease, unspecified: Secondary | ICD-10-CM | POA: Diagnosis not present

## 2020-08-28 DIAGNOSIS — J410 Simple chronic bronchitis: Secondary | ICD-10-CM | POA: Diagnosis not present

## 2020-08-30 DIAGNOSIS — J849 Interstitial pulmonary disease, unspecified: Secondary | ICD-10-CM | POA: Diagnosis not present

## 2020-08-30 DIAGNOSIS — J410 Simple chronic bronchitis: Secondary | ICD-10-CM | POA: Diagnosis not present

## 2020-09-01 DIAGNOSIS — J410 Simple chronic bronchitis: Secondary | ICD-10-CM | POA: Diagnosis not present

## 2020-09-01 DIAGNOSIS — J849 Interstitial pulmonary disease, unspecified: Secondary | ICD-10-CM | POA: Diagnosis not present

## 2020-09-06 DIAGNOSIS — J849 Interstitial pulmonary disease, unspecified: Secondary | ICD-10-CM | POA: Diagnosis not present

## 2020-09-06 DIAGNOSIS — J410 Simple chronic bronchitis: Secondary | ICD-10-CM | POA: Diagnosis not present

## 2020-09-07 ENCOUNTER — Telehealth: Payer: Self-pay | Admitting: Critical Care Medicine

## 2020-09-07 ENCOUNTER — Other Ambulatory Visit: Payer: Self-pay

## 2020-09-07 NOTE — Telephone Encounter (Signed)
ATC pt, received busy signal x2. Will try back. 

## 2020-09-08 DIAGNOSIS — J849 Interstitial pulmonary disease, unspecified: Secondary | ICD-10-CM | POA: Diagnosis not present

## 2020-09-08 DIAGNOSIS — J410 Simple chronic bronchitis: Secondary | ICD-10-CM | POA: Diagnosis not present

## 2020-09-08 MED ORDER — HYDROCODONE-ACETAMINOPHEN 5-325 MG PO TABS
1.0000 | ORAL_TABLET | Freq: Four times a day (QID) | ORAL | 0 refills | Status: DC | PRN
Start: 2020-09-08 — End: 2020-11-09

## 2020-09-11 DIAGNOSIS — J849 Interstitial pulmonary disease, unspecified: Secondary | ICD-10-CM | POA: Diagnosis not present

## 2020-09-11 DIAGNOSIS — J410 Simple chronic bronchitis: Secondary | ICD-10-CM | POA: Diagnosis not present

## 2020-09-12 ENCOUNTER — Ambulatory Visit (INDEPENDENT_AMBULATORY_CARE_PROVIDER_SITE_OTHER): Payer: Medicare Other

## 2020-09-12 DIAGNOSIS — Z23 Encounter for immunization: Secondary | ICD-10-CM

## 2020-09-12 NOTE — Progress Notes (Signed)
   Covid-19 Vaccination Clinic  Name:  Nakeita Styles    MRN: [id number] DOB: Oct 29, 1941  09/12/2020  Ms. Bamba was observed post Covid-19 immunization for 15 minutes without incident. She was provided with Vaccine Information Sheet and instruction to access the V-Safe system.   Ms. Deeg was instructed to call 911 with any severe reactions post vaccine: Marland Kitchen Difficulty breathing  . Swelling of face and throat  . A fast heartbeat  . A bad rash all over body  . Dizziness and weakness

## 2020-09-13 DIAGNOSIS — J849 Interstitial pulmonary disease, unspecified: Secondary | ICD-10-CM | POA: Diagnosis not present

## 2020-09-13 DIAGNOSIS — J410 Simple chronic bronchitis: Secondary | ICD-10-CM | POA: Diagnosis not present

## 2020-09-15 MED ORDER — STIOLTO RESPIMAT 2.5-2.5 MCG/ACT IN AERS: 2.0000 | INHALATION_SPRAY | Freq: Every day | RESPIRATORY_TRACT | 11 refills | Status: DC

## 2020-09-15 NOTE — Telephone Encounter (Signed)
Rx for stiolto has been sent to preferred pharmacy.  Patient is aware and voiced her understanding.  Nothing further needed.   

## 2020-09-18 DIAGNOSIS — J849 Interstitial pulmonary disease, unspecified: Secondary | ICD-10-CM | POA: Diagnosis not present

## 2020-09-18 DIAGNOSIS — J410 Simple chronic bronchitis: Secondary | ICD-10-CM | POA: Diagnosis not present

## 2020-09-26 ENCOUNTER — Telehealth: Payer: Self-pay

## 2020-09-26 NOTE — Telephone Encounter (Signed)
LM for pt to call back to schedule awv. Last done on 10/21/2018

## 2020-09-27 DIAGNOSIS — M1712 Unilateral primary osteoarthritis, left knee: Secondary | ICD-10-CM | POA: Diagnosis not present

## 2020-10-03 ENCOUNTER — Other Ambulatory Visit: Payer: Self-pay | Admitting: Family Medicine

## 2020-10-03 DIAGNOSIS — M150 Primary generalized (osteo)arthritis: Secondary | ICD-10-CM | POA: Diagnosis not present

## 2020-10-03 DIAGNOSIS — Z6835 Body mass index (BMI) 35.0-35.9, adult: Secondary | ICD-10-CM | POA: Diagnosis not present

## 2020-10-03 DIAGNOSIS — M0579 Rheumatoid arthritis with rheumatoid factor of multiple sites without organ or systems involvement: Secondary | ICD-10-CM | POA: Diagnosis not present

## 2020-10-03 DIAGNOSIS — E669 Obesity, unspecified: Secondary | ICD-10-CM | POA: Diagnosis not present

## 2020-10-03 DIAGNOSIS — M48061 Spinal stenosis, lumbar region without neurogenic claudication: Secondary | ICD-10-CM | POA: Diagnosis not present

## 2020-10-03 DIAGNOSIS — M2550 Pain in unspecified joint: Secondary | ICD-10-CM | POA: Diagnosis not present

## 2020-10-03 DIAGNOSIS — J849 Interstitial pulmonary disease, unspecified: Secondary | ICD-10-CM | POA: Diagnosis not present

## 2020-10-03 DIAGNOSIS — Z79899 Other long term (current) drug therapy: Secondary | ICD-10-CM | POA: Diagnosis not present

## 2020-10-13 ENCOUNTER — Other Ambulatory Visit: Payer: Self-pay | Admitting: Family Medicine

## 2020-10-16 ENCOUNTER — Telehealth: Payer: Medicare Other

## 2020-10-16 DIAGNOSIS — M1711 Unilateral primary osteoarthritis, right knee: Secondary | ICD-10-CM | POA: Diagnosis not present

## 2020-10-16 NOTE — Chronic Care Management (AMB) (Incomplete)
Chronic Care Management Pharmacy  Name: Aqueelah Cotrell  MRN: [id number] DOB: 1941/11/02  Chief Complaint/ HPI  Sherry Burke,  79 y.o. , female presents for their Follow-Up CCM visit with the clinical pharmacist via telephone due to COVID-19 Pandemic.  PCP : [name], MD  Their chronic conditions include: HTN, COPD, GERD, HLD, Rheumatoid arthritis, RLS, Depression. Office Visits: 09/12/2020 - Moderna Covid booster.  Consult Visits:  10/03/2020 - Rheumatology - hold [name] and start Xeljanz XR 11 mg daily.  08/01/2020 - Pulmonology - stop Spiriva and start Stiolgo respiramat. Refer to pulmonary rehab.  05/17/2020 - Pulmonology -  Continue Spiriva daily. Can escalate to Stiolto if symptoms worsen or wheezing. Ok to restart methotrexate if needed with PFTs 3 months after starting. Avoid nitrofurantoin use in the future.      Medications: Outpatient Encounter Medications as of 10/16/2020  Medication Sig  . albuterol (VENTOLIN HFA) 108 (90 Base) MCG/ACT inhaler Inhale 2 puffs into the lungs every 6 (six) hours as needed for wheezing or shortness of breath.  Marland Kitchen atorvastatin (LIPITOR) 40 MG tablet Take 1 tablet by mouth once daily  . cloNIDine (CATAPRES) 0.1 MG tablet Take 1 tablet by mouth twice daily  . diphenhydramine-acetaminophen (TYLENOL PM) 25-500 MG TABS tablet Take 1 tablet by mouth at bedtime as needed.  . doxycycline (VIBRA-TABS) 100 MG tablet Take 1 tablet (100 mg total) by mouth 2 (two) times daily.  Marland Kitchen estradiol (ESTRACE VAGINAL) 0.1 MG/GM vaginal cream   . fluticasone (FLONASE) 50 MCG/ACT nasal spray Place 2 sprays into both nostrils daily. (Patient taking differently: Place 2 sprays into both nostrils daily as needed for allergies. )  . furosemide (LASIX) 40 MG tablet Take 40 mg by mouth daily as needed.   . gabapentin (NEURONTIN) 300 MG capsule Take 1 capsule by mouth twice daily  . HYDROcodone-acetaminophen (NORCO/VICODIN) 5-325 MG tablet Take 1 tablet by mouth every 6  (six) hours as needed for moderate pain.  . hydroxychloroquine (PLAQUENIL) 200 MG tablet Take 1 tablet (200 mg total) by mouth 2 (two) times daily.  Marland Kitchen lisinopril (ZESTRIL) 10 MG tablet Take 1 tablet by mouth once daily  . nitrofurantoin, macrocrystal-monohydrate, (MACROBID) 100 MG capsule Take 1 capsule (100 mg total) by mouth 2 (two) times daily.  Marland Kitchen omeprazole (PRILOSEC) 40 MG capsule Take 1 capsule (40 mg total) by mouth 2 (two) times daily.  Glory Rosebush ULTRA test strip USE TO CHECK BLOOD SUGAR ONCE DAILY IN THE MORNING  . PARoxetine (PAXIL-CR) 25 MG 24 hr tablet Take 1 tablet by mouth once daily  . polyethylene glycol (MIRALAX / GLYCOLAX) 17 g packet Take 17 g by mouth daily. Use daily as needed for constipation  . rOPINIRole (REQUIP) 1 MG tablet Take 1 mg by mouth 2 (two) times daily. Take 1 tablet every morning and 2 at bedtime  . spironolactone (ALDACTONE) 25 MG tablet   . Tiotropium Bromide-Olodaterol (STIOLTO RESPIMAT) 2.5-2.5 MCG/ACT AERS Inhale 2 puffs into the lungs daily.  . Tofacitinib Citrate (XELJANZ) 5 MG TABS Take 5 mg by mouth daily.   . Vitamin D, Ergocalciferol, (DRISDOL) 1.25 MG (50000 UNIT) CAPS capsule Take 1 capsule by mouth once a week   No facility-administered encounter medications on file as of 10/16/2020.   Allergies  Allergen Reactions  . Sulfa Antibiotics Nausea And Vomiting  . Actemra [Tocilizumab]   . Amlodipine   . Celebrex [Celecoxib]   . Methotrexate Derivatives Other (See Comments)    sweating  . Niaspan [Niacin]  Severe flushing  . Remicade [Infliximab] Nausea And Vomiting and Other (See Comments)    Affects nervous system  . Welchol [Colesevelam] Other (See Comments)    Abdominal pain and headache   SDOH Screenings   Alcohol Screen:   . Last Alcohol Screening Score (AUDIT): Not on file  Depression (PHQ2-9):   . PHQ-2 Score: Not on file  Financial Resource Strain:   . Difficulty of Paying Living Expenses: Not on file  Food Insecurity:    . Worried About Charity fundraiser in the Last Year: Not on file  . Ran Out of Food in the Last Year: Not on file  Housing:   . Last Housing Risk Score: Not on file  Physical Activity:   . Days of Exercise per Week: Not on file  . Minutes of Exercise per Session: Not on file  Social Connections:   . Frequency of Communication with Friends and Family: Not on file  . Frequency of Social Gatherings with Friends and Family: Not on file  . Attends Religious Services: Not on file  . Active Member of Clubs or Organizations: Not on file  . Attends Archivist Meetings: Not on file  . Marital Status: Not on file  Stress:   . Feeling of Stress : Not on file  Tobacco Use: Medium Risk  . Smoking Tobacco Use: Passive Smoke Exposure - Never Smoker  . Smokeless Tobacco Use: Never Used  Transportation Needs:   . Film/video editor (Medical): Not on file  . Lack of Transportation (Non-Medical): Not on file     Current Diagnosis/Assessment:  Goals Addressed   None     COPD / Asthma / Tobacco   Last spirometry score: FVC 1.60 (71%), FEV1 1.07 (65%), ratio 67, TLC 94%, DLCOcor 11.82 (71%).  Gold Grade: Gold 2 (FEV1 50-79%)  Eosinophil count:   Lab Results  Component Value Date/Time   EOSPCT 4 08/23/2013 06:29 PM  %                               Eos (Absolute):  Lab Results  Component Value Date/Time   EOSABS 0.3 08/23/2013 06:29 PM    Tobacco Status:  Social History   Tobacco Use  Smoking Status Passive Smoke Exposure - Never Smoker  Smokeless Tobacco Never Used    Patient has failed these meds in past: advair, dulera,  Patient is currently controlled on the following medications: flonase 2 sprays each nostril daily prn, Spiriva 2.5 mcg/act 2 puffs daily, albuterol prn Using maintenance inhaler regularly? Yes Frequency of rescue inhaler use:  infrequently  We discussed: Patient becomes SOB and tight in chest at times. Had not been using rescue inhaler because  misunderstood directions to use both inhalers. Has had lung problems since she was a child. She stays indoors to avoid outdoor allergen triggers.   Plan  Continue current medications   ,  Hypertension   BP today is:  <140/90  Office blood pressures are  BP Readings from Last 3 Encounters:  08/01/20 130/70  03/10/20 134/60  02/25/20 138/72    Patient has failed these meds in the past: amlodipine 5 mg daily Patient is currently controlled on the following medications: lisinopril 10 mg daily, furosemide 40 mg daily prn, spironolactone 25 mg, clonidine 0.1 mg bid.  Patient checks BP at home several times per month  Patient home BP readings are ranging: 130/68 mmHg  We discussed diet  and exercise extensively. Enjoys eggs over easy and toast for breakfast. Eats cereals or toast with peanut butter. Eats peanut butter sandwiches or tuna for lunch. Occasionally will go out for meals. Loves fruit - oranges, bananas, pears and peaches. Enjoys cottage cheese. Has stopped taking her furosemide daily and uses it prn for ankle swelling. Patient wears support hose some to control ankle swelling. She said sitting so much due to knee and back pain causes the swelling to worsen.    Plan  Continue current medications   Hyperlipidemia   Lipid Panel     Component Value Date/Time   CHOL 162 02/25/2020 0943   TRIG 186 (H) 02/25/2020 0943   HDL 52 02/25/2020 0943   CHOLHDL 3.1 02/25/2020 0943   LDLCALC 79 02/25/2020 0943   LABVLDL 31 02/25/2020 0943     The ASCVD Risk score (Goff DC Jr., et al., 2013) failed to calculate for the following reasons:   Unable to determine if patient is Non-Hispanic African American   Patient has failed these meds in past: ezetemibe 10 mg daily Patient is currently uncontrolled on the following medications: atorvastatin 40 mg daily   We discussed:  diet and exercise extensively. Recently ate fresh collard greens but her digestive tract didn't tolerate it well.    Plan  Continue current medications  Osteopenia / Osteoporosis   Last DEXA Scan: 11/23/2017  T-Score femoral neck: -1.1  10-year probability of major osteoporotic fracture: 9.6% 10-year probability of hip fracture: 1.5%  No results found for: VD25OH   Patient is not a candidate for pharmacologic treatment  Patient has failed these meds in past: n/a Patient is currently controlled on the following medications: Vitamin D 50,000 unit weekly on Wednesdays  We discussed:  Recommend [phone] units of vitamin D daily. Recommend 1200 mg of calcium daily from dietary and supplemental sources. Patent didn't realize she needed to take Calcium with D on top of Vitamin D 50,000 unit. Patient denies getting a lot of dairy or calcium in her diet regularly. She states she will begin taking Calcium and D twice daily.   Plan  Continue current medications and add Calcium with D twice daily with food.      and  Other Diagnosis:GERD   Patient has failed these meds in past: pantoprazole 40 mg  Patient is currently controlled on the following medications: omeprazole 40 mg bid  We discussed:  Patient was increased to twice daily omeprazole. She was having lots of nausea and symptoms while taking Xeljanz bid. Pharmacist discussed doing a trial of slowly tapering omeprazole dose to once daily.   Plan  Trial tapering omeprazole dose to once daily and monitor for any GI side effects.     Rheumatoid Arthritis/Pain   Patient has failed these meds in past: methotrexate, lefluonamide, morphine, nabumetone Patient is currently controlled on the following medications: hydroxychloroquine 200 mg bid, xeljanz 5 mg daily, hydrocodone-apap 5-325 mg q6h prn pain.  We discussed:  Patient had to stop methotrexate due to potential of scar tissue in lungs. Now they think it is RA related not MTX related. Patient recently went to pulmonologist to be cleared if needs to resumed methotrexate therapy. Takes hydrocodone 1-2  times per week for severe pain. Xeljanz bid dosing was making patient very sick. She now takes it once daily and GI intolerance is improved and joint pain/stiffness is controlled.   Plan  Continue current medications  Depression   Patient has failed these meds in past: paxil Patient is  currently controlled on the following medications: Paroxetine CR 25 mg daily  We discussed:  Patient reports good control of symptoms.   Plan  Continue current medications   Restless Leg Syndrome   Patient has failed these meds in past: n/a Patient is currently controlled on the following medications: ropinirole 1 mg qam and 2 mg qhs, gabapentin 300 mg bid  We discussed:  Patient had terrible restless leg before beginning gabapentin. Ropinirole alone helped some but gabapentin made the biggest difference when added to ropinirole.    Plan  Continue current medications    Health Maintenance   Patient is currently controlled on the following medications:  Tylenol pm qhs prn - sleep Miralax prn - constipation Vitamin D 50,000 weekly - low vitamin D  We discussed:  Miralax leaves her bowels cleared out well but leaves her with a residual queasy feeling if takes more than 1 dose. Recommended trying 1/2 a dose to see if it works to .   Plan  Continue current medications  Vaccines   Reviewed and discussed patient's vaccination history.    Immunization History  Administered Date(s) Administered  . Influenza Whole 08/12/2011  . Influenza, High Dose Seasonal PF 08/12/2019, 08/01/2020  . Moderna SARS-COV2 Booster Vaccination 09/12/2020  . Moderna SARS-COVID-2 Vaccination 11/26/2019, 12/24/2019    Plan  Recommended patient receive annual flu vaccine in office.  Medication Management   Pt uses Mail order pharmacy for all medications Uses pill box? Yes Pt endorses 100% compliance  We discussed: Patient uses pill box to organize medications.   Plan  Continue current medication  management strategy    Follow up: 1 month phone visit

## 2020-10-17 ENCOUNTER — Telehealth: Payer: Medicare Other

## 2020-10-30 ENCOUNTER — Other Ambulatory Visit: Payer: Self-pay | Admitting: Physician Assistant

## 2020-10-30 ENCOUNTER — Other Ambulatory Visit: Payer: Self-pay | Admitting: Family Medicine

## 2020-11-01 ENCOUNTER — Telehealth: Payer: Medicare Other

## 2020-11-09 ENCOUNTER — Telehealth: Payer: Self-pay

## 2020-11-09 ENCOUNTER — Other Ambulatory Visit: Payer: Self-pay

## 2020-11-09 MED ORDER — HYDROCODONE-ACETAMINOPHEN 5-325 MG PO TABS
1.0000 | ORAL_TABLET | Freq: Four times a day (QID) | ORAL | 0 refills | Status: DC | PRN
Start: 2020-11-09 — End: 2021-02-09

## 2020-11-09 NOTE — Chronic Care Management (AMB) (Signed)
Chronic Care Management Pharmacy Assistant   Name: Sherry Burke  MRN: [id number] DOB: 09/23/1941  Reason for Encounter: Medication Review- adherence review.  Patient Questions:  1.  Have you seen any other providers since your last visit? Yes 08/01/20 Wyn Quaker, NP- Pulmonology 07/06/20- Marylyn Ishihara Zehr- Orthopedics- cortisone injection into right knee 06/26/20- Marylyn Ishihara Zehr- Orthopedics- Cortisone into left knee 06/05/20- Gavin Pound, MD- Rheumotology.  05/31/20- Alen Blew- Optometry 05/31/20- Marylyn Ishihara Zehr- Orthopedics- Monovisc injection right knee 05/17/20- Natividad Brood, DO- Pulmonology   2.  Any changes in your medicines or health? Yes 08/01/20 Wyn Quaker started patient on [address]. 07/03/20. Noemi Chapel, DO- Pulmonology- Started course of doxycycline.  05/17/20- Noemi Chapel- pulmonolgy changed ventolin from 2 puffs every 4 hours to 2 puffs every 6 hours.     PCP : [name], MD  Allergies:   Allergies  Allergen Reactions   Sulfa Antibiotics Nausea And Vomiting   Actemra [Tocilizumab]    Amlodipine    Celebrex [Celecoxib]    Methotrexate Derivatives Other (See Comments)    sweating   Niaspan [Niacin]     Severe flushing   Remicade [Infliximab] Nausea And Vomiting and Other (See Comments)    Affects nervous system   Welchol [Colesevelam] Other (See Comments)    Abdominal pain and headache    Medications: Outpatient Encounter Medications as of 11/09/2020  Medication Sig   albuterol (VENTOLIN HFA) 108 (90 Base) MCG/ACT inhaler Inhale 2 puffs into the lungs every 6 (six) hours as needed for wheezing or shortness of breath.   atorvastatin (LIPITOR) 40 MG tablet Take 1 tablet by mouth once daily   cloNIDine (CATAPRES) 0.1 MG tablet Take 1 tablet by mouth twice daily   diphenhydramine-acetaminophen (TYLENOL PM) 25-500 MG TABS tablet Take 1 tablet by mouth at bedtime as needed.   doxycycline (VIBRA-TABS) 100 MG tablet Take 1 tablet (100 mg total) by mouth 2 (two)  times daily.   estradiol (ESTRACE VAGINAL) 0.1 MG/GM vaginal cream    fluticasone (FLONASE) 50 MCG/ACT nasal spray Place 2 sprays into both nostrils daily. (Patient taking differently: Place 2 sprays into both nostrils daily as needed for allergies. )   furosemide (LASIX) 40 MG tablet Take 40 mg by mouth daily as needed.    gabapentin (NEURONTIN) 300 MG capsule Take 1 capsule by mouth twice daily   HYDROcodone-acetaminophen (NORCO/VICODIN) 5-325 MG tablet Take 1 tablet by mouth every 6 (six) hours as needed for moderate pain.   hydroxychloroquine (PLAQUENIL) 200 MG tablet Take 1 tablet (200 mg total) by mouth 2 (two) times daily.   lisinopril (ZESTRIL) 10 MG tablet Take 1 tablet by mouth once daily   nitrofurantoin, macrocrystal-monohydrate, (MACROBID) 100 MG capsule Take 1 capsule (100 mg total) by mouth 2 (two) times daily.   omeprazole (PRILOSEC) 40 MG capsule Take 1 capsule (40 mg total) by mouth 2 (two) times daily.   ONETOUCH ULTRA test strip USE TO CHECK BLOOD SUGAR ONCE DAILY IN THE MORNING   PARoxetine (PAXIL-CR) 25 MG 24 hr tablet Take 1 tablet by mouth once daily   polyethylene glycol (MIRALAX / GLYCOLAX) 17 g packet Take 17 g by mouth daily. Use daily as needed for constipation   rOPINIRole (REQUIP) 1 MG tablet Take 1 mg by mouth 2 (two) times daily. Take 1 tablet every morning and 2 at bedtime   spironolactone (ALDACTONE) 25 MG tablet    Tiotropium Bromide-Olodaterol (STIOLTO RESPIMAT) 2.5-2.5 MCG/ACT AERS Inhale 2 puffs into the lungs daily.   Tofacitinib  Citrate (XELJANZ) 5 MG TABS Take 5 mg by mouth daily.    Vitamin D, Ergocalciferol, (DRISDOL) 1.25 MG (50000 UNIT) CAPS capsule Take 1 capsule by mouth once a week   No facility-administered encounter medications on file as of 11/09/2020.    Current Diagnosis: Patient Active Problem List   Diagnosis Date Noted   Healthcare maintenance 08/01/2020   ILD (interstitial lung disease) ([place]) 03/11/2020   Acute  cystitis without hematuria 03/05/2020   Class 2 severe obesity due to excess calories with serious comorbidity and body mass index (BMI) of 37.0 to 37.9 in adult [facility]) 03/05/2020   Dermatitis 03/05/2020   GERD without esophagitis 02/25/2020   Dyslipidemia associated with type 2 diabetes mellitus ([place]) 02/25/2020   Mixed hyperlipidemia 02/25/2020   Essential hypertension 02/25/2020   Moderate recurrent major depression ([place]) 02/25/2020   Bilateral carpal tunnel syndrome 06/10/2019   Bilateral hand pain 06/10/2019   Neck pain 08/19/2016   Other spondylosis with radiculopathy, cervical region 08/19/2016   Rheumatoid arthritis involving both shoulders with positive rheumatoid factor ([place]) 08/19/2016   OSA (obstructive sleep apnea) 04/22/2012   RLS (restless legs syndrome) 04/22/2012   COPD (chronic obstructive pulmonary disease) ([place]) 03/18/2011   Reviewed chart and attempted review of adherence measures. No insurance data available for cholesterol, hypertension or diabetic medication adherence.  Follow-Up:  Pharmacist Review   Elly Modena. Owens Shark, [place] notified  Margaretmary Dys, [place] [facility] Assistant [phone]

## 2020-11-14 ENCOUNTER — Other Ambulatory Visit: Payer: Self-pay | Admitting: Family Medicine

## 2020-11-15 ENCOUNTER — Other Ambulatory Visit: Payer: Self-pay | Admitting: Family Medicine

## 2020-11-28 ENCOUNTER — Other Ambulatory Visit: Payer: Self-pay | Admitting: Family Medicine

## 2020-11-28 DIAGNOSIS — F331 Major depressive disorder, recurrent, moderate: Secondary | ICD-10-CM

## 2020-12-04 ENCOUNTER — Telehealth: Payer: Medicare Other

## 2020-12-04 NOTE — Chronic Care Management (AMB) (Unsigned)
Chronic Care Management Pharmacy  Name: Sherry Burke  MRN: [id number] DOB: 1941/04/03  Chief Complaint/ HPI  Sherry Burke,  80 y.o. , female presents for their Initial CCM visit with the clinical pharmacist via telephone due to COVID-19 Pandemic.  PCP : Sherry Brome, MD  Their chronic conditions include: HTN, COPD, GERD, HLD, Rheumatoid arthritis, RLS, Depression. Office Visits: 02/25/2020 - Start paxil cr for worsening depression. Macrobid rx given for acute cystitis.  12/27/2019 - macrodantin for acute cystitis without hematuria. Consult Visit: 08/01/2020 - pulmnology - trial of Stiolto 2 puffs daily  if symptoms worsen. Pulmonary rehab recommended.     Medications: Outpatient Encounter Medications as of 12/04/2020  Medication Sig  . albuterol (VENTOLIN HFA) 108 (90 Base) MCG/ACT inhaler Inhale 2 puffs into the lungs every 6 (six) hours as needed for wheezing or shortness of breath.  Marland Kitchen atorvastatin (LIPITOR) 40 MG tablet Take 1 tablet by mouth once daily  . cloNIDine (CATAPRES) 0.1 MG tablet Take 1 tablet by mouth twice daily  . diphenhydramine-acetaminophen (TYLENOL PM) 25-500 MG TABS tablet Take 1 tablet by mouth at bedtime as needed.  . doxycycline (VIBRA-TABS) 100 MG tablet Take 1 tablet (100 mg total) by mouth 2 (two) times daily.  Marland Kitchen estradiol (ESTRACE VAGINAL) 0.1 MG/GM vaginal cream   . fluticasone (FLONASE) 50 MCG/ACT nasal spray Place 2 sprays into both nostrils daily. (Patient taking differently: Place 2 sprays into both nostrils daily as needed for allergies. )  . furosemide (LASIX) 40 MG tablet Take 40 mg by mouth daily as needed.   . gabapentin (NEURONTIN) 300 MG capsule Take 1 capsule by mouth twice daily  . HYDROcodone-acetaminophen (NORCO/VICODIN) 5-325 MG tablet Take 1 tablet by mouth every 6 (six) hours as needed for moderate pain.  . hydroxychloroquine (PLAQUENIL) 200 MG tablet Take 1 tablet (200 mg total) by mouth 2 (two) times daily.  Marland Kitchen lisinopril (ZESTRIL) 10  MG tablet Take 1 tablet by mouth once daily  . nitrofurantoin, macrocrystal-monohydrate, (MACROBID) 100 MG capsule Take 1 capsule (100 mg total) by mouth 2 (two) times daily.  Marland Kitchen omeprazole (PRILOSEC) 40 MG capsule Take 1 capsule (40 mg total) by mouth 2 (two) times daily.  Sherry Burke ULTRA test strip USE TO CHECK BLOOD SUGAR ONCE DAILY IN THE MORNING  . PARoxetine (PAXIL-CR) 25 MG 24 hr tablet Take 1 tablet by mouth once daily  . polyethylene glycol (MIRALAX / GLYCOLAX) 17 g packet Take 17 g by mouth daily. Use daily as needed for constipation  . rOPINIRole (REQUIP) 1 MG tablet Take 1 mg by mouth 2 (two) times daily. Take 1 tablet every morning and 2 at bedtime  . spironolactone (ALDACTONE) 25 MG tablet   . Tiotropium Bromide-Olodaterol (STIOLTO RESPIMAT) 2.5-2.5 MCG/ACT AERS Inhale 2 puffs into the lungs daily.  . Tofacitinib Citrate (XELJANZ) 5 MG TABS Take 5 mg by mouth daily.   . Vitamin D, Ergocalciferol, (DRISDOL) 1.25 MG (50000 UNIT) CAPS capsule Take 1 capsule by mouth once a week   No facility-administered encounter medications on file as of 12/04/2020.   Allergies  Allergen Reactions  . Sulfa Antibiotics Nausea And Vomiting  . Actemra [Tocilizumab]   . Amlodipine   . Celebrex [Celecoxib]   . Methotrexate Derivatives Other (See Comments)    sweating  . Niaspan [Niacin]     Severe flushing  . Remicade [Infliximab] Nausea And Vomiting and Other (See Comments)    Affects nervous system  . Welchol [Colesevelam] Other (See Comments)  Abdominal pain and headache   SDOH Screenings   Alcohol Screen: Not on file  Depression (JXB1-4): Not on file  Financial Resource Strain: Not on file  Food Insecurity: Not on file  Housing: Not on file  Physical Activity: Not on file  Social Connections: Not on file  Stress: Not on file  Tobacco Use: Medium Risk  . Smoking Tobacco Use: Passive Smoke Exposure - Never Smoker  . Smokeless Tobacco Use: Never Used  Transportation Needs: Not on  file     Current Diagnosis/Assessment:  Goals Addressed   None     COPD / Asthma / Tobacco   Last spirometry score: FVC 1.60 (71%), FEV1 1.07 (65%), ratio 67, TLC 94%, DLCOcor 11.82 (71%).  Gold Grade: Gold 2 (FEV1 50-79%)  Eosinophil count:   Lab Results  Component Value Date/Time   EOSPCT 4 08/23/2013 06:29 PM  %                               Eos (Absolute):  Lab Results  Component Value Date/Time   EOSABS 0.3 08/23/2013 06:29 PM    Tobacco Status:  Social History   Tobacco Use  Smoking Status Passive Smoke Exposure - Never Smoker  Smokeless Tobacco Never Used    Patient has failed these meds in past: advair, dulera,  Patient is currently controlled on the following medications: flonase 2 sprays each nostril daily prn, Spiriva 2.5 mcg/act 2 puffs daily, albuterol prn Using maintenance inhaler regularly? Yes Frequency of rescue inhaler use:  infrequently  We discussed: Patient becomes SOB and tight in chest at times. Had not been using rescue inhaler because misunderstood directions to use both inhalers. Has had lung problems since she was a child. She stays indoors to avoid outdoor allergen triggers.   Update 04/24/2020 - Sherry Burke is ok with patient resuming MTX if Sherry Burke begins not to work. Patient states that she has had a recent CT scan. Sherry Burke determined lung damage is not due to MTX but the RA per patient.   Plan  Continue current medications     and  Other Diagnosis:GERD   Patient has failed these meds in past: pantoprazole 40 mg  Patient is currently controlled on the following medications: omeprazole 40 mg bid  We discussed:  Patient was increased to twice daily omeprazole. She was having lots of nausea and symptoms while taking Xeljanz bid. Pharmacist discussed doing a trial of slowly tapering omeprazole dose to once daily.   Update 04/24/2020 - Patient was unable to taper dow to omeprazole 40 mg daily due to reflux symptoms presenting. Patient  back to taking Omeprazole 40 mg bid. She returned to twice daily and symptoms resolved. Patient still tolerating Sherry Burke daily.   Plan  Continue Omeprazole 40 mg bid.    Rheumatoid Arthritis/Pain   Patient has failed these meds in past: methotrexate, lefluonamide, morphine, nabumetone Patient is currently controlled on the following medications: hydroxychloroquine 200 mg bid, xeljanz 5 mg daily, hydrocodone-apap 5-325 mg q6h prn pain.  We discussed:  Patient had to stop methotrexate due to potential of scar tissue in lungs. Now they think it is RA related not MTX related. Patient recently went to pulmonologist to be cleared if needs to resumed methotrexate therapy. Takes hydrocodone 1-2 times per week for severe pain. Xeljanz bid dosing was making patient very sick. She now takes it once daily and GI intolerance is improved and joint pain/stiffness is controlled.  Update 04/24/2020 - Scheduled for gel shot in July with Orthopedic MD. She is already feeling discomfort in both knees.   Plan  Continue current medications  Medication Management   Pt uses Mail order pharmacy for all medications Uses pill box? Yes Pt endorses 100% compliance  We discussed: Patient uses pill box to organize medications.   Plan  Continue current medication management strategy    Follow up: 6 month phone visit

## 2020-12-14 NOTE — Progress Notes (Signed)
Subjective:  Patient ID: Sherry Burke, female    DOB: March 02, 1941  Age: 80 y.o. MRN: [id number]  Chief Complaint  Patient presents with  . Hyperlipidemia  . Hypertension    HPI HTN-Clonidine 0.1 mg one twice a day, Lisinopril 10 mg once daily, and spironactone 25 mg once daily. Constipation: on miralax once daily.  Hyperlipidemia -Lipitor 40 mg daily GERD-Prilosec 40 mg once daily RLS-Requip 1 mg once daily at night. Depression/Anxiety-paxil cr 25 mg once daily. Depression is well controlled.  Rheumatoid arthritis: on plaquenil and xeljanz 5 mg once daily. Currently,  on hydrocodone 5/325 mg once every 6 hours prn severe pain. Uses usually once daily.   COPD: On stiolto 2 puffs daily. Ventolin inhaler. Sees pulmonology. Osteoarthritis knees: Sees orthopedic. Gets kenalog shots every 3 months.  Nails changing.  Diet: not eating greens.  UTI: positive for 3 + wbcs. Urine strong odor and concentrated. Very cloudy.  Current Outpatient Medications on File Prior to Visit  Medication Sig Dispense Refill  . albuterol (VENTOLIN HFA) 108 (90 Base) MCG/ACT inhaler Inhale 2 puffs into the lungs every 6 (six) hours as needed for wheezing or shortness of breath. 18 g 11  . atorvastatin (LIPITOR) 40 MG tablet Take 1 tablet by mouth once daily 90 tablet 0  . cloNIDine (CATAPRES) 0.1 MG tablet Take 1 tablet by mouth twice daily 180 tablet 0  . diphenhydramine-acetaminophen (TYLENOL PM) 25-500 MG TABS tablet Take 1 tablet by mouth at bedtime as needed.    Marland Kitchen estradiol (ESTRACE) 0.1 MG/GM vaginal cream     . fluticasone (FLONASE) 50 MCG/ACT nasal spray Place 2 sprays into both nostrils daily. (Patient taking differently: Place 2 sprays into both nostrils daily as needed for allergies.) 16 g 6  . furosemide (LASIX) 40 MG tablet Take 40 mg by mouth daily as needed.     . gabapentin (NEURONTIN) 300 MG capsule Take 1 capsule by mouth twice daily 180 capsule 0  . HYDROcodone-acetaminophen (NORCO/VICODIN)  5-325 MG tablet Take 1 tablet by mouth every 6 (six) hours as needed for moderate pain. 120 tablet 0  . hydroxychloroquine (PLAQUENIL) 200 MG tablet Take 1 tablet (200 mg total) by mouth 2 (two) times daily. 60 tablet 2  . lisinopril (ZESTRIL) 10 MG tablet Take 1 tablet by mouth once daily 90 tablet 0  . omeprazole (PRILOSEC) 40 MG capsule Take 1 capsule (40 mg total) by mouth 2 (two) times daily. 180 capsule 3  . ONETOUCH ULTRA test strip USE TO CHECK BLOOD SUGAR ONCE DAILY IN THE MORNING    . PARoxetine (PAXIL-CR) 25 MG 24 hr tablet Take 1 tablet by mouth once daily 90 tablet 1  . polyethylene glycol (MIRALAX / GLYCOLAX) 17 g packet Take 17 g by mouth daily. Use daily as needed for constipation    . rOPINIRole (REQUIP) 1 MG tablet Take 1 mg by mouth 2 (two) times daily. Take 1 tablet every morning and 2 at bedtime    . spironolactone (ALDACTONE) 25 MG tablet     . Tiotropium Bromide-Olodaterol (STIOLTO RESPIMAT) 2.5-2.5 MCG/ACT AERS Inhale 2 puffs into the lungs daily. 4 g 11  . Tofacitinib Citrate (XELJANZ) 5 MG TABS Take 5 mg by mouth daily.     . Vitamin D, Ergocalciferol, (DRISDOL) 1.25 MG (50000 UNIT) CAPS capsule Take 1 capsule by mouth once a week 12 capsule 3   No current facility-administered medications on file prior to visit.   Past Medical History:  Diagnosis Date  .  Acute renal insufficiency   . Asthma   . Chronic bronchitis   . COPD (chronic obstructive pulmonary disease) ([place])   . Depression   . History of migraine headaches   . Hyperlipemia   . OSA (obstructive sleep apnea)    pt states she doesn't have it  . Osteoarthritis   . Pneumonia   . Rheumatoid arthritis(714.0)   . RLS (restless legs syndrome)   . Vitamin D deficiency disease    Past Surgical History:  Procedure Laterality Date  . KNEE SURGERY Bilateral    when younger  . TOTAL ABDOMINAL HYSTERECTOMY  06/1988    Family History  Problem Relation Age of Onset  . Lung cancer Father   . Stroke Mother   .  Hypertension Mother   . Diabetes Mother   . Rheum arthritis Sister   . Rheum arthritis Sister    Social History   Socioeconomic History  . Marital status: Married    Spouse name: Durene Fruits  . Number of children: 3  . Years of education: Not on file  . Highest education level: Not on file  Occupational History  . Occupation: retired    Comment: homemaker  Tobacco Use  . Smoking status: Passive Smoke Exposure - Never Smoker  . Smokeless tobacco: Never Used  Vaping Use  . Vaping Use: Never used  Substance and Sexual Activity  . Alcohol use: No  . Drug use: No  . Sexual activity: Not Currently    Birth control/protection: None    Comment: Married  Other Topics Concern  . Not on file  Social History Narrative   Lives with her husband, who is very helpful and supportive.   Social Determinants of Health   Financial Resource Strain: Not on file  Food Insecurity: Not on file  Transportation Needs: Not on file  Physical Activity: Not on file  Stress: Not on file  Social Connections: Not on file    Review of Systems  Constitutional: Positive for chills. Negative for fatigue and fever.  HENT: Negative for congestion, ear pain, rhinorrhea and sore throat.   Respiratory: Positive for cough and shortness of breath.   Cardiovascular: Negative for chest pain and palpitations.  Gastrointestinal: Positive for constipation (relieved with miralax). Negative for abdominal pain, diarrhea, nausea and vomiting.  Genitourinary: Positive for frequency. Negative for dysuria and urgency.       Foul smelling urine   Musculoskeletal: Positive for back pain and myalgias.  Skin: Positive for rash (BL lower legs and left elbow. Do not itchy or painful. Told by dermatology that they are not psoriasis. Uses temovate 1% which helps but not resolved. ).  Neurological: Positive for dizziness and weakness. Negative for light-headedness and headaches.  Psychiatric/Behavioral: Negative for dysphoric mood.  The patient is not nervous/anxious.      Objective:  BP 128/68   Pulse (!) 104   Temp (!) 97 F (36.1 C)   Resp 20   Ht 5\' 1"  (1.549 m)   Wt 187 lb (84.8 kg)   BMI 35.33 kg/m   BP/Weight 12/15/2020 08/01/2020 1/91/4782  Systolic BP 956 213 086  Diastolic BP 68 70 60  Wt. (Lbs) 187 192 191  BMI 35.33 37.5 37.3    Physical Exam Vitals reviewed.  Constitutional:      Appearance: Normal appearance.  Neck:     Vascular: No carotid bruit.  Cardiovascular:     Rate and Rhythm: Normal rate and regular rhythm.     Heart sounds: Normal  heart sounds.  Pulmonary:     Effort: Pulmonary effort is normal.     Breath sounds: Normal breath sounds.  Abdominal:     General: Bowel sounds are normal.     Tenderness: There is no abdominal tenderness.  Musculoskeletal:        General: Normal range of motion.  Skin:    Findings: Rash (red plaques on lower legs BL.) present.  Neurological:     Mental Status: She is alert and oriented to person, place, and time.  Psychiatric:        Mood and Affect: Mood normal.        Behavior: Behavior normal.     Diabetic Foot Exam - Simple   Simple Foot Form Diabetic Foot exam was performed with the following findings: Yes 12/15/2020 11:14 AM  Visual Inspection See comments: Yes Sensation Testing Intact to touch and monofilament testing bilaterally: Yes Pulse Check Posterior Tibialis and Dorsalis pulse intact bilaterally: Yes Comments Right foot over first MTP. Erythematous nodule.       Lab Results  Component Value Date   WBC 5.8 12/15/2020   HGB 10.4 (L) 12/15/2020   HCT 33.3 (L) 12/15/2020   PLT 238 12/15/2020   GLUCOSE 100 (H) 12/15/2020   CHOL 163 12/15/2020   TRIG 103 12/15/2020   HDL 66 12/15/2020   LDLCALC 78 12/15/2020   ALT 14 12/15/2020   AST 22 12/15/2020   NA 142 12/15/2020   K 4.4 12/15/2020   CL 104 12/15/2020   CREATININE 1.44 (H) 12/15/2020   BUN 36 (H) 12/15/2020   CO2 23 12/15/2020   HGBA1C 6.0 (H) 12/15/2020       Assessment & Plan:   1. Essential hypertension The current medical regimen is effective;  continue present plan and medications. - Comprehensive metabolic panel - Cardiovascular Risk Assessment  2. ILD (interstitial lung disease) ([place]) Continue stiolto.  3. OSA (obstructive sleep apnea) Wear cpap.  4. Simple chronic bronchitis (HCC) Continue stiolto.   5. GERD without esophagitis Continue omeprazole.   6. Dyslipidemia associated with type 2 diabetes mellitus ([place]) Control: good Recommend check feet daily. Recommend annual eye exams. Medicines: none Continue to work on eating a healthy diet and try to  exercise.  Labs drawn today.   - Lipid panel - Hemoglobin A1c - CBC with Differential/Platelet  7. Acute cystitis with hematuria - Urine Culture - POCT urinalysis dipstick - ciprofloxacin (CIPRO) 250 MG tablet; Take 1 tablet (250 mg total) by mouth 2 (two) times daily.  Dispense: 14 tablet; Refill: 0  8. Screen for colon cancer - Ambulatory referral to Gastroenterology: cologuard.  9. Rheumatoid arthritis involving both shoulders with positive rheumatoid factor (HCC) The current medical regimen is effective;  continue present plan and medications. Management per specialist (rheumatology.)   10. RLS (restless legs syndrome) The current medical regimen is effective;  continue present plan and medications.  11. Mild recurrent major depression ([place]) The current medical regimen is effective;  continue present plan and medications.  12. Class 2 severe obesity due to excess calories with serious comorbidity and body mass index (BMI) of 35.0 to 35.9 in adult [facility])  Healthy diet recommended.  Meds ordered this encounter  Medications  . ciprofloxacin (CIPRO) 250 MG tablet    Sig: Take 1 tablet (250 mg total) by mouth 2 (two) times daily.    Dispense:  14 tablet    Refill:  0    Orders Placed This Encounter  Procedures  . Urine  Culture  . HM DEXA SCAN  .  Lipid panel  . Hemoglobin A1c  . Comprehensive metabolic panel  . CBC with Differential/Platelet  . Cardiovascular Risk Assessment  . Ambulatory referral to Gastroenterology  . POCT urinalysis dipstick     Follow-up: Return in about 3 months (around 03/14/2021) for fasting.  An After Visit Summary was printed and given to the patient.  [name], MD [facility] [phone]

## 2020-12-15 ENCOUNTER — Ambulatory Visit (INDEPENDENT_AMBULATORY_CARE_PROVIDER_SITE_OTHER): Payer: Medicare Other | Admitting: Family Medicine

## 2020-12-15 ENCOUNTER — Other Ambulatory Visit: Payer: Self-pay

## 2020-12-15 VITALS — BP 128/68 | HR 104 | Temp 97.0°F | Resp 20 | Ht 61.0 in | Wt 187.0 lb

## 2020-12-15 DIAGNOSIS — F330 Major depressive disorder, recurrent, mild: Secondary | ICD-10-CM

## 2020-12-15 DIAGNOSIS — N3001 Acute cystitis with hematuria: Secondary | ICD-10-CM

## 2020-12-15 DIAGNOSIS — E785 Hyperlipidemia, unspecified: Secondary | ICD-10-CM

## 2020-12-15 DIAGNOSIS — K219 Gastro-esophageal reflux disease without esophagitis: Secondary | ICD-10-CM

## 2020-12-15 DIAGNOSIS — G2581 Restless legs syndrome: Secondary | ICD-10-CM | POA: Diagnosis not present

## 2020-12-15 DIAGNOSIS — J410 Simple chronic bronchitis: Secondary | ICD-10-CM | POA: Diagnosis not present

## 2020-12-15 DIAGNOSIS — Z6835 Body mass index (BMI) 35.0-35.9, adult: Secondary | ICD-10-CM

## 2020-12-15 DIAGNOSIS — J849 Interstitial pulmonary disease, unspecified: Secondary | ICD-10-CM

## 2020-12-15 DIAGNOSIS — E1169 Type 2 diabetes mellitus with other specified complication: Secondary | ICD-10-CM

## 2020-12-15 DIAGNOSIS — I10 Essential (primary) hypertension: Secondary | ICD-10-CM

## 2020-12-15 DIAGNOSIS — M05711 Rheumatoid arthritis with rheumatoid factor of right shoulder without organ or systems involvement: Secondary | ICD-10-CM

## 2020-12-15 DIAGNOSIS — G4733 Obstructive sleep apnea (adult) (pediatric): Secondary | ICD-10-CM | POA: Diagnosis not present

## 2020-12-15 DIAGNOSIS — Z1211 Encounter for screening for malignant neoplasm of colon: Secondary | ICD-10-CM

## 2020-12-15 DIAGNOSIS — M05712 Rheumatoid arthritis with rheumatoid factor of left shoulder without organ or systems involvement: Secondary | ICD-10-CM

## 2020-12-15 LAB — POCT URINALYSIS DIPSTICK
Bilirubin, UA: NEGATIVE
Blood, UA: POSITIVE
Glucose, UA: NEGATIVE
Ketones, UA: NEGATIVE
Nitrite, UA: NEGATIVE
Protein, UA: POSITIVE — AB
Spec Grav, UA: 1.015 (ref 1.010–1.025)
Urobilinogen, UA: 0.2 E.U./dL
pH, UA: 6 (ref 5.0–8.0)

## 2020-12-15 MED ORDER — CIPROFLOXACIN HCL 250 MG PO TABS: 250.0000 mg | ORAL_TABLET | Freq: Two times a day (BID) | ORAL | 0 refills | Status: DC

## 2020-12-16 LAB — HEMOGLOBIN A1C
Est. average glucose Bld gHb Est-mCnc: 126 mg/dL
Hgb A1c MFr Bld: 6 % — ABNORMAL HIGH (ref 4.8–5.6)

## 2020-12-16 LAB — CBC WITH DIFFERENTIAL/PLATELET
Basophils Absolute: 0.1 10*3/uL (ref 0.0–0.2)
Basos: 1 %
EOS (ABSOLUTE): 0.3 10*3/uL (ref 0.0–0.4)
Eos: 5 %
Hematocrit: 33.3 % — ABNORMAL LOW (ref 34.0–46.6)
Hemoglobin: 10.4 g/dL — ABNORMAL LOW (ref 11.1–15.9)
Immature Grans (Abs): 0 10*3/uL (ref 0.0–0.1)
Immature Granulocytes: 0 %
Lymphocytes Absolute: 1 10*3/uL (ref 0.7–3.1)
Lymphs: 18 %
MCH: 26.1 pg — ABNORMAL LOW (ref 26.6–33.0)
MCHC: 31.2 g/dL — ABNORMAL LOW (ref 31.5–35.7)
MCV: 84 fL (ref 79–97)
Monocytes Absolute: 0.5 10*3/uL (ref 0.1–0.9)
Monocytes: 9 %
Neutrophils Absolute: 3.9 10*3/uL (ref 1.4–7.0)
Neutrophils: 67 %
Platelets: 238 10*3/uL (ref 150–450)
RBC: 3.99 x10E6/uL (ref 3.77–5.28)
RDW: 15.3 % (ref 11.7–15.4)
WBC: 5.8 10*3/uL (ref 3.4–10.8)

## 2020-12-16 LAB — COMPREHENSIVE METABOLIC PANEL
ALT: 14 IU/L (ref 0–32)
AST: 22 IU/L (ref 0–40)
Albumin/Globulin Ratio: 1.9 (ref 1.2–2.2)
Albumin: 4.1 g/dL (ref 3.7–4.7)
Alkaline Phosphatase: 74 IU/L (ref 44–121)
BUN/Creatinine Ratio: 25 (ref 12–28)
BUN: 36 mg/dL — ABNORMAL HIGH (ref 8–27)
Bilirubin Total: 0.3 mg/dL (ref 0.0–1.2)
CO2: 23 mmol/L (ref 20–29)
Calcium: 9.3 mg/dL (ref 8.7–10.3)
Chloride: 104 mmol/L (ref 96–106)
Creatinine, Ser: 1.44 mg/dL — ABNORMAL HIGH (ref 0.57–1.00)
GFR calc Af Amer: 40 mL/min/{1.73_m2} — ABNORMAL LOW (ref >59)
GFR calc non Af Amer: 35 mL/min/{1.73_m2} — ABNORMAL LOW (ref >59)
Globulin, Total: 2.2 g/dL (ref 1.5–4.5)
Glucose: 100 mg/dL — ABNORMAL HIGH (ref 65–99)
Potassium: 4.4 mmol/L (ref 3.5–5.2)
Sodium: 142 mmol/L (ref 134–144)
Total Protein: 6.3 g/dL (ref 6.0–8.5)

## 2020-12-16 LAB — LIPID PANEL
Chol/HDL Ratio: 2.5 ratio (ref 0.0–4.4)
Cholesterol, Total: 163 mg/dL (ref 100–199)
HDL: 66 mg/dL (ref >39)
LDL Chol Calc (NIH): 78 mg/dL (ref 0–99)
Triglycerides: 103 mg/dL (ref 0–149)
VLDL Cholesterol Cal: 19 mg/dL (ref 5–40)

## 2020-12-16 LAB — CARDIOVASCULAR RISK ASSESSMENT

## 2020-12-17 ENCOUNTER — Encounter: Payer: Self-pay | Admitting: Family Medicine

## 2020-12-20 LAB — URINE CULTURE

## 2020-12-26 ENCOUNTER — Other Ambulatory Visit (INDEPENDENT_AMBULATORY_CARE_PROVIDER_SITE_OTHER): Payer: Medicare Other

## 2020-12-26 DIAGNOSIS — N3000 Acute cystitis without hematuria: Secondary | ICD-10-CM

## 2020-12-26 LAB — POCT URINALYSIS DIPSTICK
Bilirubin, UA: NEGATIVE
Blood, UA: NEGATIVE
Glucose, UA: NEGATIVE
Ketones, UA: NEGATIVE
Nitrite, UA: NEGATIVE
Protein, UA: NEGATIVE
Spec Grav, UA: 1.015 (ref 1.010–1.025)
Urobilinogen, UA: 0.2 E.U./dL
pH, UA: 6 (ref 5.0–8.0)

## 2020-12-28 LAB — URINE CULTURE: Organism ID, Bacteria: NO GROWTH

## 2020-12-29 DIAGNOSIS — Z1211 Encounter for screening for malignant neoplasm of colon: Secondary | ICD-10-CM | POA: Diagnosis not present

## 2021-01-01 ENCOUNTER — Other Ambulatory Visit: Payer: Self-pay

## 2021-01-01 ENCOUNTER — Other Ambulatory Visit: Payer: Medicare Other

## 2021-01-01 ENCOUNTER — Ambulatory Visit (INDEPENDENT_AMBULATORY_CARE_PROVIDER_SITE_OTHER): Payer: Medicare Other | Admitting: Family Medicine

## 2021-01-01 ENCOUNTER — Encounter: Payer: Self-pay | Admitting: Family Medicine

## 2021-01-01 VITALS — BP 158/78 | HR 88 | Temp 97.3°F | Resp 18 | Wt 190.0 lb

## 2021-01-01 DIAGNOSIS — D508 Other iron deficiency anemias: Secondary | ICD-10-CM | POA: Diagnosis not present

## 2021-01-01 DIAGNOSIS — M1711 Unilateral primary osteoarthritis, right knee: Secondary | ICD-10-CM | POA: Diagnosis not present

## 2021-01-01 DIAGNOSIS — R799 Abnormal finding of blood chemistry, unspecified: Secondary | ICD-10-CM | POA: Diagnosis not present

## 2021-01-01 LAB — COMPREHENSIVE METABOLIC PANEL
ALT: 15 IU/L (ref 0–32)
AST: 27 IU/L (ref 0–40)
Albumin/Globulin Ratio: 1.7 (ref 1.2–2.2)
Albumin: 4.3 g/dL (ref 3.7–4.7)
Alkaline Phosphatase: 78 IU/L (ref 44–121)
BUN/Creatinine Ratio: 20 (ref 12–28)
BUN: 25 mg/dL (ref 8–27)
Bilirubin Total: 0.3 mg/dL (ref 0.0–1.2)
CO2: 19 mmol/L — ABNORMAL LOW (ref 20–29)
Calcium: 9.5 mg/dL (ref 8.7–10.3)
Chloride: 102 mmol/L (ref 96–106)
Creatinine, Ser: 1.28 mg/dL — ABNORMAL HIGH (ref 0.57–1.00)
GFR calc Af Amer: 46 mL/min/{1.73_m2} — ABNORMAL LOW (ref >59)
GFR calc non Af Amer: 40 mL/min/{1.73_m2} — ABNORMAL LOW (ref >59)
Globulin, Total: 2.5 g/dL (ref 1.5–4.5)
Glucose: 97 mg/dL (ref 65–99)
Potassium: 4.9 mmol/L (ref 3.5–5.2)
Sodium: 139 mmol/L (ref 134–144)
Total Protein: 6.8 g/dL (ref 6.0–8.5)

## 2021-01-01 LAB — CBC WITH DIFF/PLATELET
Basophils Absolute: 0.1 10*3/uL (ref 0.0–0.2)
Basos: 1 %
EOS (ABSOLUTE): 0.2 10*3/uL (ref 0.0–0.4)
Eos: 3 %
Hematocrit: 33.4 % — ABNORMAL LOW (ref 34.0–46.6)
Hemoglobin: 10.5 g/dL — ABNORMAL LOW (ref 11.1–15.9)
Immature Grans (Abs): 0 10*3/uL (ref 0.0–0.1)
Immature Granulocytes: 0 %
Lymphocytes Absolute: 1.2 10*3/uL (ref 0.7–3.1)
Lymphs: 16 %
MCH: 26 pg — ABNORMAL LOW (ref 26.6–33.0)
MCHC: 31.4 g/dL — ABNORMAL LOW (ref 31.5–35.7)
MCV: 83 fL (ref 79–97)
Monocytes Absolute: 0.7 10*3/uL (ref 0.1–0.9)
Monocytes: 9 %
Neutrophils Absolute: 5 10*3/uL (ref 1.4–7.0)
Neutrophils: 71 %
Platelets: 244 10*3/uL (ref 150–450)
RBC: 4.04 x10E6/uL (ref 3.77–5.28)
RDW: 15.2 % (ref 11.7–15.4)
WBC: 7.2 10*3/uL (ref 3.4–10.8)

## 2021-01-01 MED ORDER — TRIAMCINOLONE ACETONIDE 40 MG/ML IJ SUSP
80.0000 mg | Freq: Once | INTRAMUSCULAR | Status: AC
  Administered 2021-01-01: 80 mg via INTRA_ARTICULAR

## 2021-01-01 NOTE — Progress Notes (Signed)
Acute Office Visit  Subjective:    Patient ID: Sherry Burke, female    DOB: 15-Jul-1941, 80 y.o.   MRN: [id number]  Chief Complaint  Patient presents with  . Osteoarthritis    HPI Patient is in today for right knee injection for OA of right knee. She also came for repeat labwork.  Past Medical History:  Diagnosis Date  . Acute renal insufficiency   . Asthma   . Chronic bronchitis   . COPD (chronic obstructive pulmonary disease) ([place])   . Depression   . History of migraine headaches   . Hyperlipemia   . OSA (obstructive sleep apnea)    pt states she doesn't have it  . Osteoarthritis   . Pneumonia   . Rheumatoid arthritis(714.0)   . RLS (restless legs syndrome)   . Vitamin D deficiency disease     Past Surgical History:  Procedure Laterality Date  . KNEE SURGERY Bilateral    when younger  . TOTAL ABDOMINAL HYSTERECTOMY  06/1988    Family History  Problem Relation Age of Onset  . Lung cancer Father   . Stroke Mother   . Hypertension Mother   . Diabetes Mother   . Rheum arthritis Sister   . Rheum arthritis Sister     Social History   Socioeconomic History  . Marital status: Married    Spouse name: Durene Fruits  . Number of children: 3  . Years of education: Not on file  . Highest education level: Not on file  Occupational History  . Occupation: retired    Comment: homemaker  Tobacco Use  . Smoking status: Passive Smoke Exposure - Never Smoker  . Smokeless tobacco: Never Used  Vaping Use  . Vaping Use: Never used  Substance and Sexual Activity  . Alcohol use: No  . Drug use: No  . Sexual activity: Not Currently    Birth control/protection: None    Comment: Married  Other Topics Concern  . Not on file  Social History Narrative   Lives with her husband, who is very helpful and supportive.   Social Determinants of Health   Financial Resource Strain: Not on file  Food Insecurity: Not on file  Transportation Needs: Not on file  Physical Activity: Not on  file  Stress: Not on file  Social Connections: Not on file  Intimate Partner Violence: Not on file    Outpatient Medications Prior to Visit  Medication Sig Dispense Refill  . albuterol (VENTOLIN HFA) 108 (90 Base) MCG/ACT inhaler Inhale 2 puffs into the lungs every 6 (six) hours as needed for wheezing or shortness of breath. 18 g 11  . atorvastatin (LIPITOR) 40 MG tablet Take 1 tablet by mouth once daily 90 tablet 0  . ciprofloxacin (CIPRO) 250 MG tablet Take 1 tablet (250 mg total) by mouth 2 (two) times daily. 14 tablet 0  . cloNIDine (CATAPRES) 0.1 MG tablet Take 1 tablet by mouth twice daily 180 tablet 0  . diphenhydramine-acetaminophen (TYLENOL PM) 25-500 MG TABS tablet Take 1 tablet by mouth at bedtime as needed.    Marland Kitchen estradiol (ESTRACE) 0.1 MG/GM vaginal cream     . fluticasone (FLONASE) 50 MCG/ACT nasal spray Place 2 sprays into both nostrils daily. (Patient taking differently: Place 2 sprays into both nostrils daily as needed for allergies.) 16 g 6  . furosemide (LASIX) 40 MG tablet Take 40 mg by mouth daily as needed.     . gabapentin (NEURONTIN) 300 MG capsule Take 1 capsule by mouth  twice daily 180 capsule 0  . HYDROcodone-acetaminophen (NORCO/VICODIN) 5-325 MG tablet Take 1 tablet by mouth every 6 (six) hours as needed for moderate pain. 120 tablet 0  . hydroxychloroquine (PLAQUENIL) 200 MG tablet Take 1 tablet (200 mg total) by mouth 2 (two) times daily. 60 tablet 2  . lisinopril (ZESTRIL) 10 MG tablet Take 1 tablet by mouth once daily 90 tablet 0  . omeprazole (PRILOSEC) 40 MG capsule Take 1 capsule (40 mg total) by mouth 2 (two) times daily. 180 capsule 3  . ONETOUCH ULTRA test strip USE TO CHECK BLOOD SUGAR ONCE DAILY IN THE MORNING    . PARoxetine (PAXIL-CR) 25 MG 24 hr tablet Take 1 tablet by mouth once daily 90 tablet 1  . polyethylene glycol (MIRALAX / GLYCOLAX) 17 g packet Take 17 g by mouth daily. Use daily as needed for constipation    . rOPINIRole (REQUIP) 1 MG tablet  Take 1 mg by mouth 2 (two) times daily. Take 1 tablet every morning and 2 at bedtime    . spironolactone (ALDACTONE) 25 MG tablet     . Tiotropium Bromide-Olodaterol (STIOLTO RESPIMAT) 2.5-2.5 MCG/ACT AERS Inhale 2 puffs into the lungs daily. 4 g 11  . Tofacitinib Citrate (XELJANZ) 5 MG TABS Take 5 mg by mouth daily.     . Vitamin D, Ergocalciferol, (DRISDOL) 1.25 MG (50000 UNIT) CAPS capsule Take 1 capsule by mouth once a week 12 capsule 3   No facility-administered medications prior to visit.    Allergies  Allergen Reactions  . Sulfa Antibiotics Nausea And Vomiting  . Actemra [Tocilizumab]   . Amlodipine   . Celebrex [Celecoxib]   . Methotrexate Derivatives Other (See Comments)    sweating  . Niaspan [Niacin]     Severe flushing  . Remicade [Infliximab] Nausea And Vomiting and Other (See Comments)    Affects nervous system  . Welchol [Colesevelam] Other (See Comments)    Abdominal pain and headache    Review of Systems  Musculoskeletal: Positive for arthralgias (BL knee OA).       Objective:    Physical Exam Vitals reviewed.  Constitutional:      Appearance: Normal appearance. She is obese.  Musculoskeletal:        General: Tenderness (right knee. edema. tender.) present.  Neurological:     Mental Status: She is alert.     BP (!) 158/78   Pulse 88   Temp (!) 97.3 F (36.3 C)   Resp 18   Wt 190 lb (86.2 kg)   BMI 35.90 kg/m  Wt Readings from Last 3 Encounters:  01/01/21 190 lb (86.2 kg)  12/15/20 187 lb (84.8 kg)  08/01/20 192 lb (87.1 kg)    Health Maintenance Due  Topic Date Due  . Hepatitis C Screening  Never done  . OPHTHALMOLOGY EXAM  Never done    There are no preventive care reminders to display for this patient.   No results found for: TSH Lab Results  Component Value Date   WBC 5.8 12/15/2020   HGB 10.4 (L) 12/15/2020   HCT 33.3 (L) 12/15/2020   MCV 84 12/15/2020   PLT 238 12/15/2020   Lab Results  Component Value Date   NA 142  12/15/2020   K 4.4 12/15/2020   CO2 23 12/15/2020   GLUCOSE 100 (H) 12/15/2020   BUN 36 (H) 12/15/2020   CREATININE 1.44 (H) 12/15/2020   BILITOT 0.3 12/15/2020   ALKPHOS 74 12/15/2020   AST 22 12/15/2020  ALT 14 12/15/2020   PROT 6.3 12/15/2020   ALBUMIN 4.1 12/15/2020   CALCIUM 9.3 12/15/2020   Lab Results  Component Value Date   CHOL 163 12/15/2020   Lab Results  Component Value Date   HDL 66 12/15/2020   Lab Results  Component Value Date   LDLCALC 78 12/15/2020   Lab Results  Component Value Date   TRIG 103 12/15/2020   Lab Results  Component Value Date   CHOLHDL 2.5 12/15/2020   Lab Results  Component Value Date   HGBA1C 6.0 (H) 12/15/2020       Assessment & Plan:  1. Primary osteoarthritis of right knee  Risks were discussed including bleeding, infection, increase in sugars if diabetic, atrophy at site of injection, and increased pain.  After consent was obtained, using sterile technique the right knee was prepped with alcohol.  The joint was entered and 0 ml's of fluid was withdrawn.  Kenalog 80 mg and 5 ml plain Lidocaine was then injected and the needle withdrawn.  The procedure was well tolerated.   The patient is asked to continue to rest the joint for a few more days before resuming regular activities.  It may be more painful for the first 1-2 days.  Watch for fever, or increased swelling or persistent pain in the joint. Call or return to clinic prn if such symptoms occur or there is failure to improve as anticipated.   Follow-up: Return for left knee injection.  An After Visit Summary was printed and given to the patient.  [name], MD [facility] [phone]

## 2021-01-03 ENCOUNTER — Other Ambulatory Visit: Payer: Self-pay

## 2021-01-03 DIAGNOSIS — R944 Abnormal results of kidney function studies: Secondary | ICD-10-CM

## 2021-01-04 ENCOUNTER — Other Ambulatory Visit: Payer: Self-pay | Admitting: Family Medicine

## 2021-01-05 LAB — COLOGUARD: Cologuard: NEGATIVE

## 2021-01-07 NOTE — Progress Notes (Signed)
Acute Office Visit  Subjective:    Patient ID: Sherry Burke, female    DOB: 09-20-1941, 80 y.o.   MRN: [id number]  Chief Complaint  Patient presents with  . Knee Pain    HPI Patient is in today for left knee pain. Here for arthrocentesis of kenalog injection.  Past Medical History:  Diagnosis Date  . Acute renal insufficiency   . Asthma   . Chronic bronchitis   . COPD (chronic obstructive pulmonary disease) ([place])   . Depression   . History of migraine headaches   . Hyperlipemia   . OSA (obstructive sleep apnea)    pt states she doesn't have it  . Osteoarthritis   . Pneumonia   . Rheumatoid arthritis(714.0)   . RLS (restless legs syndrome)   . Vitamin D deficiency disease     Past Surgical History:  Procedure Laterality Date  . KNEE SURGERY Bilateral    when younger  . TOTAL ABDOMINAL HYSTERECTOMY  06/1988    Family History  Problem Relation Age of Onset  . Lung cancer Father   . Stroke Mother   . Hypertension Mother   . Diabetes Mother   . Rheum arthritis Sister   . Rheum arthritis Sister     Social History   Socioeconomic History  . Marital status: Married    Spouse name: Durene Fruits  . Number of children: 3  . Years of education: Not on file  . Highest education level: Not on file  Occupational History  . Occupation: retired    Comment: homemaker  Tobacco Use  . Smoking status: Passive Smoke Exposure - Never Smoker  . Smokeless tobacco: Never Used  Vaping Use  . Vaping Use: Never used  Substance and Sexual Activity  . Alcohol use: No  . Drug use: No  . Sexual activity: Not Currently    Birth control/protection: None    Comment: Married  Other Topics Concern  . Not on file  Social History Narrative   Lives with her husband, who is very helpful and supportive.   Social Determinants of Health   Financial Resource Strain: Not on file  Food Insecurity: No Food Insecurity  . Worried About Charity fundraiser in the Last Year: Never true  . Ran  Out of Food in the Last Year: Never true  Transportation Needs: Not on file  Physical Activity: Not on file  Stress: Not on file  Social Connections: Not on file  Intimate Partner Violence: Not on file    Outpatient Medications Prior to Visit  Medication Sig Dispense Refill  . albuterol (VENTOLIN HFA) 108 (90 Base) MCG/ACT inhaler Inhale 2 puffs into the lungs every 6 (six) hours as needed for wheezing or shortness of breath. 18 g 11  . atorvastatin (LIPITOR) 40 MG tablet Take 1 tablet by mouth once daily 90 tablet 0  . ciprofloxacin (CIPRO) 250 MG tablet Take 1 tablet (250 mg total) by mouth 2 (two) times daily. (Patient not taking: Reported on 01/10/2021) 14 tablet 0  . cloNIDine (CATAPRES) 0.1 MG tablet Take 1 tablet by mouth twice daily 180 tablet 0  . diphenhydramine-acetaminophen (TYLENOL PM) 25-500 MG TABS tablet Take 1 tablet by mouth at bedtime as needed.    Marland Kitchen estradiol (ESTRACE) 0.1 MG/GM vaginal cream     . fluticasone (FLONASE) 50 MCG/ACT nasal spray Place 2 sprays into both nostrils daily. (Patient taking differently: Place 2 sprays into both nostrils daily as needed for allergies.) 16 g 6  .  gabapentin (NEURONTIN) 300 MG capsule Take 1 capsule by mouth twice daily 180 capsule 0  . HYDROcodone-acetaminophen (NORCO/VICODIN) 5-325 MG tablet Take 1 tablet by mouth every 6 (six) hours as needed for moderate pain. 120 tablet 0  . hydroxychloroquine (PLAQUENIL) 200 MG tablet Take 1 tablet (200 mg total) by mouth 2 (two) times daily. 60 tablet 2  . lisinopril (ZESTRIL) 10 MG tablet Take 1 tablet by mouth once daily 90 tablet 0  . omeprazole (PRILOSEC) 40 MG capsule Take 1 capsule (40 mg total) by mouth 2 (two) times daily. 180 capsule 3  . ONETOUCH ULTRA test strip USE TO CHECK BLOOD SUGAR ONCE DAILY IN THE MORNING    . PARoxetine (PAXIL-CR) 25 MG 24 hr tablet Take 1 tablet by mouth once daily 90 tablet 1  . polyethylene glycol (MIRALAX / GLYCOLAX) 17 g packet Take 17 g by mouth daily.  Use daily as needed for constipation    . rOPINIRole (REQUIP) 1 MG tablet Take 1 mg by mouth 2 (two) times daily. Take 1 tablet every morning and 2 at bedtime    . Tiotropium Bromide-Olodaterol (STIOLTO RESPIMAT) 2.5-2.5 MCG/ACT AERS Inhale 2 puffs into the lungs daily. 4 g 11  . Tofacitinib Citrate (XELJANZ) 5 MG TABS Take 5 mg by mouth daily.     . Vitamin D, Ergocalciferol, (DRISDOL) 1.25 MG (50000 UNIT) CAPS capsule Take 1 capsule by mouth once a week 12 capsule 3   No facility-administered medications prior to visit.    Allergies  Allergen Reactions  . Sulfa Antibiotics Nausea And Vomiting  . Actemra [Tocilizumab]   . Amlodipine   . Celebrex [Celecoxib]   . Methotrexate Derivatives Other (See Comments)    sweating  . Niaspan [Niacin]     Severe flushing  . Remicade [Infliximab] Nausea And Vomiting and Other (See Comments)    Affects nervous system  . Welchol [Colesevelam] Other (See Comments)    Abdominal pain and headache    Review of Systems  Constitutional: Negative for chills, fatigue and fever.  HENT: Negative for congestion, ear pain and sore throat.   Respiratory: Positive for shortness of breath. Negative for cough.   Cardiovascular: Negative for chest pain and palpitations.  Gastrointestinal: Negative for abdominal pain, constipation, diarrhea, nausea and vomiting.  Endocrine: Negative for polydipsia, polyphagia and polyuria.  Genitourinary: Negative for dysuria.  Musculoskeletal: Positive for arthralgias (knee pain). Negative for back pain and myalgias.  Skin: Negative for rash.  Neurological: Positive for headaches.  Psychiatric/Behavioral: Negative for dysphoric mood. The patient is not nervous/anxious.        Objective:    Physical Exam Vitals reviewed.  Constitutional:      Appearance: Normal appearance.  Neck:     Vascular: No carotid bruit.  Musculoskeletal:        General: Tenderness (rt knee. inferior. ) present.  Neurological:     Mental  Status: She is alert and oriented to person, place, and time.  Psychiatric:        Mood and Affect: Mood normal.        Behavior: Behavior normal.     BP 140/84   Pulse (!) 116   Temp (!) 97.4 F (36.3 C)   Resp 20   Ht 5\' 1"  (1.549 m)   Wt 182 lb (82.6 kg)   BMI 34.39 kg/m  Wt Readings from Last 3 Encounters:  01/08/21 182 lb (82.6 kg)  01/01/21 190 lb (86.2 kg)  12/15/20 187 lb (84.8 kg)  Health Maintenance Due  Topic Date Due  . Hepatitis C Screening  Never done  . OPHTHALMOLOGY EXAM  Never done    There are no preventive care reminders to display for this patient.   No results found for: TSH Lab Results  Component Value Date   WBC 7.2 01/01/2021   HGB 10.5 (L) 01/01/2021   HCT 33.4 (L) 01/01/2021   MCV 83 01/01/2021   PLT 244 01/01/2021   Lab Results  Component Value Date   NA 139 01/01/2021   K 4.9 01/01/2021   CO2 19 (L) 01/01/2021   GLUCOSE 97 01/01/2021   BUN 25 01/01/2021   CREATININE 1.28 (H) 01/01/2021   BILITOT 0.3 01/01/2021   ALKPHOS 78 01/01/2021   AST 27 01/01/2021   ALT 15 01/01/2021   PROT 6.8 01/01/2021   ALBUMIN 4.3 01/01/2021   CALCIUM 9.5 01/01/2021   Lab Results  Component Value Date   CHOL 163 12/15/2020   Lab Results  Component Value Date   HDL 66 12/15/2020   Lab Results  Component Value Date   LDLCALC 78 12/15/2020   Lab Results  Component Value Date   TRIG 103 12/15/2020   Lab Results  Component Value Date   CHOLHDL 2.5 12/15/2020   Lab Results  Component Value Date   HGBA1C 6.0 (H) 12/15/2020       Assessment & Plan:  1. Primary osteoarthritis of left knee Risks were discussed including bleeding, infection, increase in sugars if diabetic, atrophy at site of injection, and increased pain.  After consent was obtained, using sterile technique the left knee was prepped with betadine and alcohol.  The joint was entered and 0 ml's of fluid was withdrawn. Kenalog 80 mg and 5 ml plain Lidocaine was then  injected and the needle withdrawn.  The procedure was well tolerated.   The patient is asked to continue to rest the joint for a few more days before resuming regular activities.  It may be more painful for the first 1-2 days.  Watch for fever, or increased swelling or persistent pain in the joint. Call or return to clinic prn if such symptoms occur or there is failure to improve as anticipated. - triamcinolone acetonide (KENALOG-40) injection 80 mg    Meds ordered this encounter  Medications  . triamcinolone acetonide (KENALOG-40) injection 80 mg   Follow-up: Return in about 12 weeks (around 04/02/2021).  An After Visit Summary was printed and given to the patient.  [name], MD [facility] [phone]

## 2021-01-08 ENCOUNTER — Other Ambulatory Visit: Payer: Self-pay

## 2021-01-08 ENCOUNTER — Ambulatory Visit (INDEPENDENT_AMBULATORY_CARE_PROVIDER_SITE_OTHER): Payer: Medicare Other | Admitting: Family Medicine

## 2021-01-08 VITALS — BP 140/84 | HR 116 | Temp 97.4°F | Resp 20 | Ht 61.0 in | Wt 182.0 lb

## 2021-01-08 DIAGNOSIS — M1712 Unilateral primary osteoarthritis, left knee: Secondary | ICD-10-CM

## 2021-01-08 DIAGNOSIS — M1711 Unilateral primary osteoarthritis, right knee: Secondary | ICD-10-CM

## 2021-01-08 MED ORDER — TRIAMCINOLONE ACETONIDE 40 MG/ML IJ SUSP
80.0000 mg | Freq: Once | INTRAMUSCULAR | Status: AC
  Administered 2021-01-08: 80 mg via INTRA_ARTICULAR

## 2021-01-09 DIAGNOSIS — M0579 Rheumatoid arthritis with rheumatoid factor of multiple sites without organ or systems involvement: Secondary | ICD-10-CM | POA: Diagnosis not present

## 2021-01-09 DIAGNOSIS — Z79899 Other long term (current) drug therapy: Secondary | ICD-10-CM | POA: Diagnosis not present

## 2021-01-09 DIAGNOSIS — Z6834 Body mass index (BMI) 34.0-34.9, adult: Secondary | ICD-10-CM | POA: Diagnosis not present

## 2021-01-09 DIAGNOSIS — M48061 Spinal stenosis, lumbar region without neurogenic claudication: Secondary | ICD-10-CM | POA: Diagnosis not present

## 2021-01-09 DIAGNOSIS — E669 Obesity, unspecified: Secondary | ICD-10-CM | POA: Diagnosis not present

## 2021-01-09 DIAGNOSIS — J849 Interstitial pulmonary disease, unspecified: Secondary | ICD-10-CM | POA: Diagnosis not present

## 2021-01-09 DIAGNOSIS — M150 Primary generalized (osteo)arthritis: Secondary | ICD-10-CM | POA: Diagnosis not present

## 2021-01-09 NOTE — Progress Notes (Signed)
Chronic Care Management Pharmacy Note  01/10/2021 Name:  Sherry Burke MRN:  [id number] DOB:  06/28/1941   Plan Recommendations:   Patient has started iron and b12 supplements as recommended by Dr. Tobie Poet.   Controlling diabetes with diet.   Had elevated bp at rheumatology visit but well controlled at home.   Will review Dexa scan once available (ordered 12/2020).   Subjective: Sherry Burke is an 80 y.o. year old female who is a primary patient of Cox, Kirsten, MD.  The CCM team was consulted for assistance with disease management and care coordination needs.    Engaged with patient by telephone for follow up visit in response to provider referral for pharmacy case management and/or care coordination services.   Consent to Services:  The patient was given information about Chronic Care Management services, agreed to services, and gave verbal consent prior to initiation of services.  Please see initial visit note for detailed documentation.   Patient Care Team: [name], MD as PCP - General (Family Medicine) Burnice Logan, [facility] as Pharmacist (Pharmacist) Hennie Duos, MD as Consulting Physician (Rheumatology)  Recent office visits: 01/08/2021 - kenalog injection.  01/03/2021 - hold lasix and spironolactone.  01/01/2021 - kenalog injection into right knee.  12/15/2020 - cipro given for UTI. Cologuard ordered. Hemoglobin dropped. Recommend increase in daily water consumption. Serum creatinine increased to 1.44. A1c 6%.   Recent consult visits: 10/03/2020 - hold Xeljnaz 5 mg daily and start Xeljanz XR 11 mg daily.   Hospital visits: None in previous 6 months  Objective:  Lab Results  Component Value Date   CREATININE 1.28 (H) 01/01/2021   BUN 25 01/01/2021   GFRNONAA 40 (L) 01/01/2021   GFRAA 46 (L) 01/01/2021   NA 139 01/01/2021   K 4.9 01/01/2021   CALCIUM 9.5 01/01/2021   CO2 19 (L) 01/01/2021    Lab Results  Component Value Date/Time   HGBA1C 6.0 (H)  12/15/2020 11:19 AM   HGBA1C 5.8 (H) 02/25/2020 09:43 AM    Last diabetic Eye exam: No results found for: HMDIABEYEEXA  Last diabetic Foot exam: No results found for: HMDIABFOOTEX   Lab Results  Component Value Date   CHOL 163 12/15/2020   HDL 66 12/15/2020   LDLCALC 78 12/15/2020   TRIG 103 12/15/2020   CHOLHDL 2.5 12/15/2020    Hepatic Function Latest Ref Rng & Units 01/01/2021 12/15/2020 08/23/2013  Total Protein 6.0 - 8.5 g/dL 6.8 6.3 7.0  Albumin 3.7 - 4.7 g/dL 4.3 4.1 4.0  AST 0 - 40 IU/L '27 22 26  ' ALT 0 - 32 IU/L '15 14 26  ' Alk Phosphatase 44 - 121 IU/L 78 74 68  Total Bilirubin 0.0 - 1.2 mg/dL 0.3 0.3 0.4    No results found for: TSH, FREET4  CBC Latest Ref Rng & Units 01/01/2021 12/15/2020 08/23/2013  WBC 3.4 - 10.8 x10E3/uL 7.2 5.8 7.6  Hemoglobin 11.1 - 15.9 g/dL 10.5(L) 10.4(L) 12.9  Hematocrit 34.0 - 46.6 % 33.4(L) 33.3(L) 39.5  Platelets 150 - 450 x10E3/uL 244 238 161    No results found for: VD25OH  Clinical ASCVD: No  The ASCVD Risk score Mikey Bussing DC Jr., et al., 2013) failed to calculate for the following reasons:   Unable to determine if patient is Non-Hispanic African American    Depression screen [facility] 2/9 01/08/2021 12/17/2020 12/15/2020  Decreased Interest 0 - 0  Down, Depressed, Hopeless 0 - 0  PHQ - 2 Score 0 - 0  Difficult  doing work/chores - Not difficult at all -     Social History   Tobacco Use  Smoking Status Passive Smoke Exposure - Never Smoker  Smokeless Tobacco Never Used   BP Readings from Last 3 Encounters:  01/08/21 140/84  01/01/21 (!) 158/78  12/15/20 128/68   Pulse Readings from Last 3 Encounters:  01/08/21 (!) 116  01/01/21 88  12/15/20 (!) 104   Wt Readings from Last 3 Encounters:  01/08/21 182 lb (82.6 kg)  01/01/21 190 lb (86.2 kg)  12/15/20 187 lb (84.8 kg)    Assessment/Interventions: Review of patient past medical history, allergies, medications, health status, including review of consultants reports, laboratory and  other test data, was performed as part of comprehensive evaluation and provision of chronic care management services.   SDOH:  (Social Determinants of Health) assessments and interventions performed: Yes   CCM Care Plan  Allergies  Allergen Reactions  . Sulfa Antibiotics Nausea And Vomiting  . Actemra [Tocilizumab]   . Amlodipine   . Celebrex [Celecoxib]   . Methotrexate Derivatives Other (See Comments)    sweating  . Niaspan [Niacin]     Severe flushing  . Remicade [Infliximab] Nausea And Vomiting and Other (See Comments)    Affects nervous system  . Welchol [Colesevelam] Other (See Comments)    Abdominal pain and headache    Medications Reviewed Today    Reviewed by Burnice Logan, [facility] (Pharmacist) on 01/10/21 at 1337  Med List Status: <None>  Medication Order Taking? Sig Documenting Provider Last Dose Status Informant  albuterol (VENTOLIN HFA) 108 (90 Base) MCG/ACT inhaler [id number] Yes Inhale 2 puffs into the lungs every 6 (six) hours as needed for wheezing or shortness of breath. Julian Hy, DO Taking Active   atorvastatin (LIPITOR) 40 MG tablet [id number] Yes Take 1 tablet by mouth once daily Cox, Kirsten, MD Taking Active   ciprofloxacin (CIPRO) 250 MG tablet [id number] No Take 1 tablet (250 mg total) by mouth 2 (two) times daily.  Patient not taking: Reported on 01/10/2021   CoxElnita Maxwell, MD Not Taking Active   cloNIDine (CATAPRES) 0.1 MG tablet [id number] Yes Take 1 tablet by mouth twice daily Cox, Kirsten, MD Taking Active   cyanocobalamin 2000 MCG tablet [id number] Yes Take 2,000 mcg by mouth daily. [provider] Taking Active   diphenhydramine-acetaminophen (TYLENOL PM) 25-500 MG TABS tablet [id number] Yes Take 1 tablet by mouth at bedtime as needed. [provider] Taking Active   estradiol (ESTRACE) 0.1 MG/GM vaginal cream [id number]   [provider]  Active   ferrous sulfate 325 (65 FE) MG tablet [id number] Yes Take 325 mg by mouth daily with  breakfast. [provider] Taking Active   fluticasone (FLONASE) 50 MCG/ACT nasal spray [id number] Yes Place 2 sprays into both nostrils daily.  Patient taking differently: Place 2 sprays into both nostrils daily as needed for allergies.   Cox, Kirsten, MD Taking Active   gabapentin (NEURONTIN) 300 MG capsule [id number] Yes Take 1 capsule by mouth twice daily Cox, Kirsten, MD Taking Active   HYDROcodone-acetaminophen (NORCO/VICODIN) 5-325 MG tablet [id number] Yes Take 1 tablet by mouth every 6 (six) hours as needed for moderate pain. Cox, Kirsten, MD Taking Active   hydroxychloroquine (PLAQUENIL) 200 MG tablet [id number] Yes Take 1 tablet (200 mg total) by mouth 2 (two) times daily. Cox, Kirsten, MD Taking Active   lisinopril (ZESTRIL) 10 MG tablet [id number] Yes Take 1 tablet by mouth once daily Cox, Kirsten, MD Taking Active  omeprazole (PRILOSEC) 40 MG capsule [id number] Yes Take 1 capsule (40 mg total) by mouth 2 (two) times daily. [name], MD Taking Active   [facility] ULTRA test strip [id number] Yes USE TO CHECK BLOOD SUGAR ONCE DAILY IN THE MORNING [provider] Taking Active   PARoxetine (PAXIL-CR) 25 MG 24 hr tablet [id number] Yes Take 1 tablet by mouth once daily Cox, Kirsten, MD Taking Active   polyethylene glycol (MIRALAX / GLYCOLAX) 17 g packet [id number] Yes Take 17 g by mouth daily. Use daily as needed for constipation [provider] Taking Active   rOPINIRole (REQUIP) 1 MG tablet [id number] Yes Take 1 mg by mouth 2 (two) times daily. Take 1 tablet every morning and 2 at bedtime [provider] Taking Active   Tiotropium Bromide-Olodaterol (STIOLTO RESPIMAT) 2.5-2.5 MCG/ACT AERS [id number] Yes Inhale 2 puffs into the lungs daily. Lauraine Rinne, NP Taking Active   Tofacitinib Citrate (XELJANZ) 5 MG TABS [id number] Yes Take 5 mg by mouth daily.  [provider] Taking Active   Vitamin D, Ergocalciferol, (DRISDOL) 1.25 MG (50000 UNIT) CAPS capsule  [id number] Yes Take 1 capsule by mouth once a week Cox, Elnita Maxwell, MD Taking Active           Patient Active Problem List   Diagnosis Date Noted  . Healthcare maintenance 08/01/2020  . ILD (interstitial lung disease) ([place]) 03/11/2020  . Acute cystitis without hematuria 03/05/2020  . Class 2 severe obesity due to excess calories with serious comorbidity and body mass index (BMI) of 37.0 to 37.9 in adult [place]) 03/05/2020  . Dermatitis 03/05/2020  . GERD without esophagitis 02/25/2020  . Dyslipidemia associated with type 2 diabetes mellitus ([place]) 02/25/2020  . Mixed hyperlipidemia 02/25/2020  . Essential hypertension 02/25/2020  . Moderate recurrent major depression ([place]) 02/25/2020  . Bilateral carpal tunnel syndrome 06/10/2019  . Bilateral hand pain 06/10/2019  . Neck pain 08/19/2016  . Other spondylosis with radiculopathy, cervical region 08/19/2016  . Rheumatoid arthritis involving both shoulders with positive rheumatoid factor ([place]) 08/19/2016  . OSA (obstructive sleep apnea) 04/22/2012  . RLS (restless legs syndrome) 04/22/2012  . COPD (chronic obstructive pulmonary disease) ([place]) 03/18/2011    Immunization History  Administered Date(s) Administered  . Influenza Whole 08/12/2011  . Influenza, High Dose Seasonal PF 08/12/2019, 08/01/2020  . Moderna SARS-COV2 Booster Vaccination 09/12/2020  . Moderna Sars-Covid-2 Vaccination 11/26/2019, 12/24/2019  . Pneumococcal Conjugate-13 09/08/2014  . Pneumococcal Polysaccharide-23 10/05/2014  . Tdap 10/01/2011    Conditions to be addressed/monitored:  Hypertension, Hyperlipidemia, Diabetes, COPD, Depression and Rheumatoid arthritis  Care Plan : [place]  Updates made by Burnice Logan, RPH since 01/10/2021 12:00 AM    Problem: dm, htn, hld   Priority: High  Onset Date: 01/10/2021    Long-Range Goal: Disease Management   Start Date: 01/10/2021  Expected End Date: 01/10/2022  This Visit's Progress: On track  Priority: High   Note:      Current Barriers:  . Unable to optimize arthritis regimen due to side effects.    Pharmacist Clinical Goal(s):  Marland Kitchen Over the next 90 days, patient will adhere to prescribed medication regimen as evidenced by fill history through collaboration with PharmD and provider.   Interventions: . 1:1 collaboration with [name], MD regarding development and update of comprehensive plan of care as evidenced by provider attestation and co-signature . Inter-disciplinary care team collaboration (see longitudinal plan of care) . Comprehensive medication review performed; medication list updated in electronic medical record  Hypertension (BP goal <130/80) -Controlled -Current treatment: . lisinopril 10 mg daily  . Clonidine 0.1 mg bid  -Medications previously tried: amlodipine, furosemide, spironolactone -Current home readings: 130-135/80 -Current dietary habits: wathcing salt in diet  -Current exercise habits: minimal due to back and knee pain  -Denies hypotensive/hypertensive symptoms -Educated on BP goals and benefits of medications for prevention of heart attack, stroke and kidney damage; Daily salt intake goal < 2300 mg; Exercise goal of 150 minutes per week; Importance of home blood pressure monitoring; Proper BP monitoring technique; -Counseled to monitor BP at home weekly, document, and provide log at future appointments -Counseled on diet and exercise extensively Recommended to continue current medication  Hyperlipidemia: (LDL goal < 70) -Not ideally controlled -Current treatment: . atorvastatin 40 mg daily  -Medications previously tried: none reported  -Current dietary patterns: vegetables, wheat bread, homemade soup -Current exercise habits: minimal due to knee/back pain -Educated on Cholesterol goals;  Benefits of statin for ASCVD risk reduction; Importance of limiting foods high in cholesterol; -Counseled on diet and exercise extensively Recommended to  continue current medication  Diabetes (A1c goal <7%) -Controlled -Current medications: . Diet and lifestyle -Medications previously tried: n/a  -Denies hypoglycemic/hyperglycemic symptoms -Current meal patterns:  . breakfast: eggs and toast  . lunch: vegetables  . dinner: sandwich -Current exercise: minimal due to knee/back pain  -Educated onA1c and blood sugar goals; Complications of diabetes including kidney damage, retinal damage, and cardiovascular disease; Carbohydrate counting and/or plate method -Counseled to check feet daily and get yearly eye exams -Counseled on diet and exercise extensively  COPD (Goal: control symptoms and prevent exacerbations) -Controlled -Current treatment  . Albuterol inhaler 2 puffs every 6 hours prn wheezing or shortness of breath . Flonas nasal spray 2 sprays into both nostrils daily  . Stiolto respimat 2 puffs daily  -Medications previously tried: advair, dulera, spirivia -Gold Grade: Gold 2 (FEV1 50-79%) -Pulmonary function testing: fev1 65% -Exacerbations requiring treatment in last 6 months: n/a -Patient reports consistent use of maintenance inhaler -Frequency of rescue inhaler use: daily  -Counseled on Proper inhaler technique; Benefits of consistent maintenance inhaler use Differences between maintenance and rescue inhalers -Counseled on diet and exercise extensively Recommended to continue current medication   Depression/Anxiety (Goal: manage symptoms of depression) -Controlled -Current treatment: .  Paroxetine CR 25 mg daily  -Medications previously tried/failed: none reported -PHQ9: 0 -Educated on Benefits of medication for symptom control -Recommended to continue current medication  Osteoporosis / Osteopenia (Goal reduce risk of fracture ) -Controlled -Last DEXA Scan:  ordered updated scan 12/2020. 2019 scan showed osteopenia -Patient's updated Dexa scan is ordered.  -Current treatment  . Vitamin D 50,000 units weekly   -Medications previously tried: none reported  -Recommend [phone] units of vitamin D daily. Recommend 1200 mg of calcium daily from dietary and supplemental sources. Recommend weight-bearing and muscle strengthening exercises for building and maintaining bone density. -Counseled on diet and exercise extensively Recommended to continue current medication  Rheumatoid Arthritis  (Goal: minimize joint pain/swelling) -Controlled -Current treatment  . Hydrocodone-acetaminophen 5-325 mg every 6 hours prn moderate pain  . Xeljanz 5 mg daily . Hydroxychloroquine 200 mg bid  -Medications previously tried:  methotrexate, leflunomide, nabumetone -Recommended to continue current medication Patient reports that she is stable on current regimen.    Patient Goals/Self-Care Activities . Over the next 90 days, patient will:  - take medications as prescribed focus on medication adherence by using pill box engage in dietary modifications by reduced sodium.  Follow Up Plan: Telephone follow up appointment with care management team member scheduled for: 07/2021      Medication Assistance: None required.  Patient affirms current coverage meets needs.  Patient's preferred pharmacy is:  [facility] [address], [place] [address] [place] [address] Phone: [phone] Fax: [phone]  [facility], [place] to [address] [place] [address] Phone: [phone] Fax: [phone]  Uses pill box? Yes Pt endorses good compliance  We discussed: Current pharmacy is preferred with insurance plan and patient is satisfied with pharmacy services Patient decided to: Continue current medication management strategy  Care Plan and Follow Up Patient Decision:  Patient agrees to Care Plan and Follow-up.  Plan: Telephone follow up appointment with care management team member  scheduled for:  07/2021

## 2021-01-10 ENCOUNTER — Other Ambulatory Visit: Payer: Self-pay

## 2021-01-10 ENCOUNTER — Ambulatory Visit (INDEPENDENT_AMBULATORY_CARE_PROVIDER_SITE_OTHER): Payer: Medicare Other

## 2021-01-10 DIAGNOSIS — E1169 Type 2 diabetes mellitus with other specified complication: Secondary | ICD-10-CM

## 2021-01-10 DIAGNOSIS — E785 Hyperlipidemia, unspecified: Secondary | ICD-10-CM

## 2021-01-10 DIAGNOSIS — I10 Essential (primary) hypertension: Secondary | ICD-10-CM | POA: Diagnosis not present

## 2021-01-10 LAB — FERRITIN: Ferritin: 73 ng/mL (ref 15–150)

## 2021-01-10 LAB — IRON AND TIBC
Iron Saturation: 15 % (ref 15–55)
Iron: 52 ug/dL (ref 27–139)
Total Iron Binding Capacity: 342 ug/dL (ref 250–450)
UIBC: 290 ug/dL (ref 118–369)

## 2021-01-10 LAB — SPECIMEN STATUS REPORT

## 2021-01-10 LAB — METHYLMALONIC ACID, SERUM: Methylmalonic Acid: 465 nmol/L — ABNORMAL HIGH (ref 0–378)

## 2021-01-10 LAB — B12 AND FOLATE PANEL
Folate: 10 ng/mL (ref >3.0)
Vitamin B-12: 695 pg/mL (ref 232–1245)

## 2021-01-10 NOTE — Patient Instructions (Addendum)
Visit Information  Goals Addressed            This Visit's Progress   . Learn More About My Health       Timeframe:  Long-Range Goal Priority:  High Start Date:     01/10/2021                        Expected End Date:  01/10/2022                      Follow Up Date 07/28/2021    - tell my story and reason for my visit - repeat what I heard to make sure I understand - bring a list of my medicines to the visit    Why is this important?    The best way to learn about your health and care is by talking to the doctor and nurse.   They will answer your questions and give you information in the way that you like best.    Notes:     Marland Kitchen Manage My Medicine       Timeframe:  Long-Range Goal Priority:  High Start Date:     01/10/2021                        Expected End Date:   01/10/2022                    Follow Up Date 07/17/2021    - call for medicine refill 2 or 3 days before it runs out - keep a list of all the medicines I take; vitamins and herbals too - learn to read medicine labels - use a pillbox to sort medicine    Why is this important?   . These steps will help you keep on track with your medicines.   Notes:     Marland Kitchen Monitor and Manage My Blood Sugar-Diabetes Type 2       Timeframe:  Long-Range Goal Priority:  High Start Date:             01/10/2021                Expected End Date:  01/10/2022        Follow Up Date 07/17/2021    - check blood sugar at prescribed times    Why is this important?    Checking your blood sugar at home helps to keep it from getting very high or very low.   Writing the results in a diary or log helps the doctor know how to care for you.   Your blood sugar log should have the time, date and the results.   Also, write down the amount of insulin or other medicine that you take.   Other information, like what you ate, exercise done and how you were feeling, will also be helpful.     Notes:       Patient Care Plan: CCM  Pharmacy Care Plan    Problem Identified: dm, htn, hld   Priority: High  Onset Date: 01/10/2021    Long-Range Goal: Disease Management   Start Date: 01/10/2021  Expected End Date: 01/10/2022  This Visit's Progress: On track  Priority: High  Note:      Current Barriers:  . Unable to optimize arthritis regimen due to side effects.    Pharmacist Clinical Goal(s):  Marland Kitchen Over the next 90 days, patient  will adhere to prescribed medication regimen as evidenced by fill history through collaboration with PharmD and provider.   Interventions: . 1:1 collaboration with [name], MD regarding development and update of comprehensive plan of care as evidenced by provider attestation and co-signature . Inter-disciplinary care team collaboration (see longitudinal plan of care) . Comprehensive medication review performed; medication list updated in electronic medical record  Hypertension (BP goal <130/80) -Controlled -Current treatment: . lisinopril 10 mg daily  . Clonidine 0.1 mg bid  -Medications previously tried: amlodipine, furosemide, spironolactone -Current home readings: 130-135/80 -Current dietary habits: wathcing salt in diet  -Current exercise habits: minimal due to back and knee pain  -Denies hypotensive/hypertensive symptoms -Educated on BP goals and benefits of medications for prevention of heart attack, stroke and kidney damage; Daily salt intake goal < 2300 mg; Exercise goal of 150 minutes per week; Importance of home blood pressure monitoring; Proper BP monitoring technique; -Counseled to monitor BP at home weekly, document, and provide log at future appointments -Counseled on diet and exercise extensively Recommended to continue current medication  Hyperlipidemia: (LDL goal < 70) -Not ideally controlled -Current treatment: . atorvastatin 40 mg daily  -Medications previously tried: none reported  -Current dietary patterns: vegetables, wheat bread, homemade soup -Current  exercise habits: minimal due to knee/back pain -Educated on Cholesterol goals;  Benefits of statin for ASCVD risk reduction; Importance of limiting foods high in cholesterol; -Counseled on diet and exercise extensively Recommended to continue current medication  Diabetes (A1c goal <7%) -Controlled -Current medications: . Diet and lifestyle -Medications previously tried: n/a  -Denies hypoglycemic/hyperglycemic symptoms -Current meal patterns:  . breakfast: eggs and toast  . lunch: vegetables  . dinner: sandwich -Current exercise: minimal due to knee/back pain  -Educated onA1c and blood sugar goals; Complications of diabetes including kidney damage, retinal damage, and cardiovascular disease; Carbohydrate counting and/or plate method -Counseled to check feet daily and get yearly eye exams -Counseled on diet and exercise extensively  COPD (Goal: control symptoms and prevent exacerbations) -Controlled -Current treatment  . Albuterol inhaler 2 puffs every 6 hours prn wheezing or shortness of breath . Flonas nasal spray 2 sprays into both nostrils daily  . Stiolto respimat 2 puffs daily  -Medications previously tried: advair, dulera, spirivia -Gold Grade: Gold 2 (FEV1 50-79%) -Pulmonary function testing: fev1 65% -Exacerbations requiring treatment in last 6 months: n/a -Patient reports consistent use of maintenance inhaler -Frequency of rescue inhaler use: daily  -Counseled on Proper inhaler technique; Benefits of consistent maintenance inhaler use Differences between maintenance and rescue inhalers -Counseled on diet and exercise extensively Recommended to continue current medication   Depression/Anxiety (Goal: manage symptoms of depression) -Controlled -Current treatment: .  Paroxetine CR 25 mg daily  -Medications previously tried/failed: none reported -PHQ9: 0 -Educated on Benefits of medication for symptom control -Recommended to continue current  medication  Osteoporosis / Osteopenia (Goal reduce risk of fracture ) -Controlled -Last DEXA Scan:  ordered updated scan 12/2020. 2019 scan showed osteopenia -Patient's updated Dexa scan is ordered.  -Current treatment  . Vitamin D 50,000 units weekly  -Medications previously tried: none reported  -Recommend [phone] units of vitamin D daily. Recommend 1200 mg of calcium daily from dietary and supplemental sources. Recommend weight-bearing and muscle strengthening exercises for building and maintaining bone density. -Counseled on diet and exercise extensively Recommended to continue current medication  Rheumatoid Arthritis  (Goal: minimize joint pain/swelling) -Controlled -Current treatment  . Hydrocodone-acetaminophen 5-325 mg every 6 hours prn moderate pain  . [name] 5  mg daily . Hydroxychloroquine 200 mg bid  -Medications previously tried:  methotrexate, leflunomide, nabumetone -Recommended to continue current medication Patient reports that she is stable on current regimen.    Patient Goals/Self-Care Activities . Over the next 90 days, patient will:  - take medications as prescribed focus on medication adherence by using pill box engage in dietary modifications by reduced sodium.   Follow Up Plan: Telephone follow up appointment with care management team member scheduled for: 07/2021      The patient verbalized understanding of instructions, educational materials, and care plan provided today and declined offer to receive copy of patient instructions, educational materials, and care plan.  Telephone follow up appointment with pharmacy team member scheduled for: 07/2021  Burnice Logan, [facility]  Blood Pressure Record Sheet To take your blood pressure, you will need a blood pressure machine. You can buy a blood pressure machine (blood pressure monitor) at your clinic, drug store, or online. When choosing one, consider:  An automatic monitor that has an arm cuff.  A cuff that  wraps snugly around your upper arm. You should be able to fit only one finger between your arm and the cuff.  A device that stores blood pressure reading results.  Do not choose a monitor that measures your blood pressure from your wrist or finger. Follow your health care provider's instructions for how to take your blood pressure. To use this form:  Get one reading in the morning (a.m.) before you take any medicines.  Get one reading in the evening (p.m.) before supper.  Take at least 2 readings with each blood pressure check. This makes sure the results are correct. Wait 1-2 minutes between measurements.  Write down the results in the spaces on this form.  Repeat this once a week, or as told by your health care provider.  Make a follow-up appointment with your health care provider to discuss the results. Blood pressure log Date: _______________________  a.m. _____________________(1st reading) _____________________(2nd reading)  p.m. _____________________(1st reading) _____________________(2nd reading) Date: _______________________  a.m. _____________________(1st reading) _____________________(2nd reading)  p.m. _____________________(1st reading) _____________________(2nd reading) Date: _______________________  a.m. _____________________(1st reading) _____________________(2nd reading)  p.m. _____________________(1st reading) _____________________(2nd reading) Date: _______________________  a.m. _____________________(1st reading) _____________________(2nd reading)  p.m. _____________________(1st reading) _____________________(2nd reading) Date: _______________________  a.m. _____________________(1st reading) _____________________(2nd reading)  p.m. _____________________(1st reading) _____________________(2nd reading) This information is not intended to replace advice given to you by your health care provider. Make sure you discuss any questions you have with your health  care provider. Document Revised: 02/16/2020 Document Reviewed: 02/16/2020 Elsevier Patient Education  2021 [employer].

## 2021-01-13 ENCOUNTER — Encounter: Payer: Self-pay | Admitting: Family Medicine

## 2021-01-15 ENCOUNTER — Ambulatory Visit: Payer: Medicare Other | Admitting: Family Medicine

## 2021-01-27 ENCOUNTER — Other Ambulatory Visit: Payer: Self-pay | Admitting: Family Medicine

## 2021-02-05 ENCOUNTER — Other Ambulatory Visit: Payer: Medicare Other

## 2021-02-05 ENCOUNTER — Other Ambulatory Visit: Payer: Self-pay

## 2021-02-05 DIAGNOSIS — R944 Abnormal results of kidney function studies: Secondary | ICD-10-CM

## 2021-02-06 LAB — COMPREHENSIVE METABOLIC PANEL
ALT: 28 IU/L (ref 0–32)
AST: 27 IU/L (ref 0–40)
Albumin/Globulin Ratio: 1.7 (ref 1.2–2.2)
Albumin: 4.2 g/dL (ref 3.7–4.7)
Alkaline Phosphatase: 86 IU/L (ref 44–121)
BUN/Creatinine Ratio: 19 (ref 12–28)
BUN: 22 mg/dL (ref 8–27)
Bilirubin Total: 0.3 mg/dL (ref 0.0–1.2)
CO2: 22 mmol/L (ref 20–29)
Calcium: 8.7 mg/dL (ref 8.7–10.3)
Chloride: 106 mmol/L (ref 96–106)
Creatinine, Ser: 1.15 mg/dL — ABNORMAL HIGH (ref 0.57–1.00)
Globulin, Total: 2.5 g/dL (ref 1.5–4.5)
Glucose: 109 mg/dL — ABNORMAL HIGH (ref 65–99)
Potassium: 4.5 mmol/L (ref 3.5–5.2)
Sodium: 142 mmol/L (ref 134–144)
Total Protein: 6.7 g/dL (ref 6.0–8.5)
eGFR: 48 mL/min/{1.73_m2} — ABNORMAL LOW (ref >59)

## 2021-02-09 ENCOUNTER — Other Ambulatory Visit: Payer: Self-pay

## 2021-02-09 MED ORDER — HYDROCODONE-ACETAMINOPHEN 5-325 MG PO TABS: 1.0000 | ORAL_TABLET | Freq: Four times a day (QID) | ORAL | 0 refills | Status: DC | PRN

## 2021-02-26 ENCOUNTER — Other Ambulatory Visit: Payer: Self-pay | Admitting: Family Medicine

## 2021-03-19 ENCOUNTER — Other Ambulatory Visit (INDEPENDENT_AMBULATORY_CARE_PROVIDER_SITE_OTHER): Payer: Medicare Other

## 2021-03-19 ENCOUNTER — Telehealth: Payer: Self-pay

## 2021-03-19 DIAGNOSIS — R39198 Other difficulties with micturition: Secondary | ICD-10-CM | POA: Diagnosis not present

## 2021-03-19 DIAGNOSIS — R319 Hematuria, unspecified: Secondary | ICD-10-CM | POA: Diagnosis not present

## 2021-03-19 LAB — POCT URINALYSIS DIPSTICK
Bilirubin, UA: NEGATIVE
Glucose, UA: NEGATIVE
Ketones, UA: NEGATIVE
Leukocytes, UA: NEGATIVE
Nitrite, UA: NEGATIVE
Protein, UA: POSITIVE — AB
Spec Grav, UA: 1.025 (ref 1.010–1.025)
Urobilinogen, UA: 0.2 E.U./dL
pH, UA: 6 (ref 5.0–8.0)

## 2021-03-19 NOTE — Chronic Care Management (AMB) (Signed)
Chronic Care Management Pharmacy Assistant   Name: Tyquasia Pant  MRN: [id number] DOB: 19-Nov-1940  November Sypher is an 80 y.o. year old female who presents for his follow-up  CCM visit with the clinical pharmacist.  Reason for Encounter: Disease State call for COPD    Recent office visits:  No office visits noted since last CPP visit  Recent consult visits:  No consult visits noted since last CPP visit   Hospital visits:  No hospital visits noted since last CPP visit   Medications: Outpatient Encounter Medications as of 03/19/2021  Medication Sig  . albuterol (VENTOLIN HFA) 108 (90 Base) MCG/ACT inhaler Inhale 2 puffs into the lungs every 6 (six) hours as needed for wheezing or shortness of breath.  Marland Kitchen atorvastatin (LIPITOR) 40 MG tablet Take 1 tablet by mouth once daily  . ciprofloxacin (CIPRO) 250 MG tablet Take 1 tablet (250 mg total) by mouth 2 (two) times daily. (Patient not taking: Reported on 01/10/2021)  . cloNIDine (CATAPRES) 0.1 MG tablet Take 1 tablet by mouth twice daily  . cyanocobalamin 2000 MCG tablet Take 2,000 mcg by mouth daily.  . diphenhydramine-acetaminophen (TYLENOL PM) 25-500 MG TABS tablet Take 1 tablet by mouth at bedtime as needed.  Marland Kitchen estradiol (ESTRACE) 0.1 MG/GM vaginal cream   . ferrous sulfate 325 (65 FE) MG tablet Take 325 mg by mouth daily with breakfast.  . fluticasone (FLONASE) 50 MCG/ACT nasal spray Place 2 sprays into both nostrils daily. (Patient taking differently: Place 2 sprays into both nostrils daily as needed for allergies.)  . gabapentin (NEURONTIN) 300 MG capsule Take 1 capsule by mouth twice daily  . HYDROcodone-acetaminophen (NORCO/VICODIN) 5-325 MG tablet Take 1 tablet by mouth every 6 (six) hours as needed for moderate pain.  . hydroxychloroquine (PLAQUENIL) 200 MG tablet Take 1 tablet (200 mg total) by mouth 2 (two) times daily.  Marland Kitchen lisinopril (ZESTRIL) 10 MG tablet Take 1 tablet by mouth once daily  . omeprazole (PRILOSEC) 40 MG  capsule Take 1 capsule (40 mg total) by mouth 2 (two) times daily.  Glory Rosebush ULTRA test strip USE TO CHECK BLOOD SUGAR ONCE DAILY IN THE MORNING  . PARoxetine (PAXIL-CR) 25 MG 24 hr tablet Take 1 tablet by mouth once daily  . polyethylene glycol (MIRALAX / GLYCOLAX) 17 g packet Take 17 g by mouth daily. Use daily as needed for constipation  . rOPINIRole (REQUIP) 1 MG tablet TAKE 1 TABLET EVERY MORNINGAND 2 TABLETS AT BEDTIME  . Tiotropium Bromide-Olodaterol (STIOLTO RESPIMAT) 2.5-2.5 MCG/ACT AERS Inhale 2 puffs into the lungs daily.  . Tofacitinib Citrate (XELJANZ) 5 MG TABS Take 5 mg by mouth daily.   . Vitamin D, Ergocalciferol, (DRISDOL) 1.25 MG (50000 UNIT) CAPS capsule Take 1 capsule by mouth once a week   No facility-administered encounter medications on file as of 03/19/2021.    . Current COPD regimen:  Albuterol inhaler, 2 puffs q6h prn wheezing or SOB.Marland Kitchen  Flonase nasal spray, 2 sprays into each nostril daily  Stiolto respimat, 2 puffs qd   . No flowsheet data found.   . reports the following COPD symptoms, including Symptoms worse with exercise   . What recent interventions/DTPs have been made by any provider to improve breathing since last visit: Patient stated that there have been no recent interventions or DTP's since last CPP visit.    . Have you had exacerbation/flare-up since last visit? Patient stated she she has not had any unusual flare-ups.   . What do you  do when you are short of breath?  Patient stated that she sits down and rests, and does inhaler if needed.    Respiratory Devices/Equipment  How often do you forget to use your daily inhaler? Patient stated she takes her daily inhaler as prescribed.   How often do you use your rescue inhaler?  1x day at most, some days not at all. Patient stated that it depends on her activity level.   Do you use a spacer with your inhaler? Yes   Adherence Review: Does the patient have >5 day gap between last estimated fill  date for maintenance inhaler medications?  Yes.  CPP to review  Star Rating Drugs: Medication:   Last filled: Days supply:  Atorvastatin   01/06/2021 90DS Lisinopril  07/18/2020 [place], [place] Clinical Pharmacist Assistant

## 2021-03-19 NOTE — Progress Notes (Signed)
    Chronic Care Management Pharmacy Assistant   Name: Sherry Burke  MRN: [id number] DOB: 05-07-1941  Unable to reach patient x3 attempts.    Reason for Encounter: Disease State for COPD  Recent office visits:  None since last CPP visit  Recent consult visits:  None since last CPP visit  Hospital visits:  None since last visit with CPP.  Medications: Outpatient Encounter Medications as of 03/19/2021  Medication Sig  . albuterol (VENTOLIN HFA) 108 (90 Base) MCG/ACT inhaler Inhale 2 puffs into the lungs every 6 (six) hours as needed for wheezing or shortness of breath.  Marland Kitchen atorvastatin (LIPITOR) 40 MG tablet Take 1 tablet by mouth once daily  . ciprofloxacin (CIPRO) 250 MG tablet Take 1 tablet (250 mg total) by mouth 2 (two) times daily. (Patient not taking: Reported on 01/10/2021)  . cloNIDine (CATAPRES) 0.1 MG tablet Take 1 tablet by mouth twice daily  . cyanocobalamin 2000 MCG tablet Take 2,000 mcg by mouth daily.  . diphenhydramine-acetaminophen (TYLENOL PM) 25-500 MG TABS tablet Take 1 tablet by mouth at bedtime as needed.  Marland Kitchen estradiol (ESTRACE) 0.1 MG/GM vaginal cream   . ferrous sulfate 325 (65 FE) MG tablet Take 325 mg by mouth daily with breakfast.  . fluticasone (FLONASE) 50 MCG/ACT nasal spray Place 2 sprays into both nostrils daily. (Patient taking differently: Place 2 sprays into both nostrils daily as needed for allergies.)  . gabapentin (NEURONTIN) 300 MG capsule Take 1 capsule by mouth twice daily  . HYDROcodone-acetaminophen (NORCO/VICODIN) 5-325 MG tablet Take 1 tablet by mouth every 6 (six) hours as needed for moderate pain.  . hydroxychloroquine (PLAQUENIL) 200 MG tablet Take 1 tablet (200 mg total) by mouth 2 (two) times daily.  Marland Kitchen lisinopril (ZESTRIL) 10 MG tablet Take 1 tablet by mouth once daily  . omeprazole (PRILOSEC) 40 MG capsule Take 1 capsule (40 mg total) by mouth 2 (two) times daily.  Glory Rosebush ULTRA test strip USE TO CHECK BLOOD SUGAR ONCE DAILY IN THE  MORNING  . PARoxetine (PAXIL-CR) 25 MG 24 hr tablet Take 1 tablet by mouth once daily  . polyethylene glycol (MIRALAX / GLYCOLAX) 17 g packet Take 17 g by mouth daily. Use daily as needed for constipation  . rOPINIRole (REQUIP) 1 MG tablet TAKE 1 TABLET EVERY MORNINGAND 2 TABLETS AT BEDTIME  . Tiotropium Bromide-Olodaterol (STIOLTO RESPIMAT) 2.5-2.5 MCG/ACT AERS Inhale 2 puffs into the lungs daily.  . Tofacitinib Citrate (XELJANZ) 5 MG TABS Take 5 mg by mouth daily.   . Vitamin D, Ergocalciferol, (DRISDOL) 1.25 MG (50000 UNIT) CAPS capsule Take 1 capsule by mouth once a week   No facility-administered encounter medications on file as of 03/19/2021.   . Current COPD regimen:  . Albuterol inhale 2 puffs q6h prn .  Stiolto Respimat inhale 2 puffs into the lungs daily    Star Metric Medications:  Medications:   Last filled:   Days supply:  Atorvastatin   12/31/2020  [place], [place] Clinical Pharmacist Assistant

## 2021-03-20 ENCOUNTER — Ambulatory Visit (INDEPENDENT_AMBULATORY_CARE_PROVIDER_SITE_OTHER): Payer: Medicare Other | Admitting: Family Medicine

## 2021-03-20 ENCOUNTER — Other Ambulatory Visit: Payer: Self-pay | Admitting: Family Medicine

## 2021-03-20 ENCOUNTER — Ambulatory Visit (INDEPENDENT_AMBULATORY_CARE_PROVIDER_SITE_OTHER): Payer: Medicare Other

## 2021-03-20 ENCOUNTER — Other Ambulatory Visit: Payer: Self-pay

## 2021-03-20 VITALS — BP 138/64 | HR 74 | Temp 97.2°F | Resp 16 | Ht 60.0 in | Wt 198.0 lb

## 2021-03-20 DIAGNOSIS — N3001 Acute cystitis with hematuria: Secondary | ICD-10-CM

## 2021-03-20 DIAGNOSIS — L989 Disorder of the skin and subcutaneous tissue, unspecified: Secondary | ICD-10-CM | POA: Diagnosis not present

## 2021-03-20 DIAGNOSIS — Z23 Encounter for immunization: Secondary | ICD-10-CM

## 2021-03-20 LAB — POCT URINALYSIS DIPSTICK
Bilirubin, UA: NEGATIVE
Blood, UA: NEGATIVE
Glucose, UA: NEGATIVE
Ketones, UA: NEGATIVE
Leukocytes, UA: NEGATIVE
Nitrite, UA: NEGATIVE
Protein, UA: POSITIVE — AB
Spec Grav, UA: >=1.03 — AB (ref 1.010–1.025)
Urobilinogen, UA: 0.2 E.U./dL
pH, UA: 6 (ref 5.0–8.0)

## 2021-03-20 MED ORDER — HYDROCODONE-ACETAMINOPHEN 5-325 MG PO TABS: 1.0000 | ORAL_TABLET | Freq: Four times a day (QID) | ORAL | 0 refills | Status: DC | PRN

## 2021-03-20 MED ORDER — CIPROFLOXACIN HCL 250 MG PO TABS: 250.0000 mg | ORAL_TABLET | Freq: Two times a day (BID) | ORAL | 0 refills | Status: AC

## 2021-03-20 NOTE — Progress Notes (Signed)
   Covid-19 Vaccination Clinic  Name:  Sherry Burke    MRN: [id number] DOB: Aug 17, 1941  03/20/2021  Ms. Sherry Burke was observed post Covid-19 immunization for 15 minutes without incident. She was provided with Vaccine Information Sheet and instruction to access the V-Safe system.   Ms. Sherry Burke was instructed to call 911 with any severe reactions post vaccine: Marland Kitchen Difficulty breathing  . Swelling of face and throat  . A fast heartbeat  . A bad rash all over body  . Dizziness and weakness   Immunizations Administered    Name Date Dose VIS Date Route   Moderna Covid-19 Booster Vaccine 03/20/2021  4:32 PM 0.25 mL 08/30/2020 Intramuscular   Manufacturer: Moderna   Lot: [id number]   [place]: 32419-914-44

## 2021-03-20 NOTE — Progress Notes (Signed)
Acute Office Visit  Subjective:    Patient ID: Sherry Burke, female    DOB: January 23, 1941, 80 y.o.   MRN: [id number]  Chief Complaint  Patient presents with  . difficulty voiding    HPI Patient is in today for difficulty voiding. UA from yesterday showed blood, but otherwise normal. UA today shows no blood. Urine culture sent.  No dysuria, No incontinence. No flank pain. No fever or chills.   Past Medical History:  Diagnosis Date  . Acute renal insufficiency   . Asthma   . Chronic bronchitis   . COPD (chronic obstructive pulmonary disease) ([place])   . Depression   . History of migraine headaches   . Hyperlipemia   . OSA (obstructive sleep apnea)    pt states she doesn't have it  . Osteoarthritis   . Pneumonia   . Rheumatoid arthritis(714.0)   . RLS (restless legs syndrome)   . Vitamin D deficiency disease     Past Surgical History:  Procedure Laterality Date  . KNEE SURGERY Bilateral    when younger  . TOTAL ABDOMINAL HYSTERECTOMY  06/1988    Family History  Problem Relation Age of Onset  . Lung cancer Father   . Stroke Mother   . Hypertension Mother   . Diabetes Mother   . Rheum arthritis Sister   . Rheum arthritis Sister     Social History   Socioeconomic History  . Marital status: Married    Spouse name: Durene Fruits  . Number of children: 3  . Years of education: Not on file  . Highest education level: Not on file  Occupational History  . Occupation: retired    Comment: homemaker  Tobacco Use  . Smoking status: Passive Smoke Exposure - Never Smoker  . Smokeless tobacco: Never Used  Vaping Use  . Vaping Use: Never used  Substance and Sexual Activity  . Alcohol use: No  . Drug use: No  . Sexual activity: Not Currently    Birth control/protection: None    Comment: Married  Other Topics Concern  . Not on file  Social History Narrative   Lives with her husband, who is very helpful and supportive.   Social Determinants of Health   Financial Resource  Strain: Not on file  Food Insecurity: No Food Insecurity  . Worried About Charity fundraiser in the Last Year: Never true  . Ran Out of Food in the Last Year: Never true  Transportation Needs: Not on file  Physical Activity: Not on file  Stress: Not on file  Social Connections: Not on file  Intimate Partner Violence: Not on file    Outpatient Medications Prior to Visit  Medication Sig Dispense Refill  . albuterol (VENTOLIN HFA) 108 (90 Base) MCG/ACT inhaler Inhale 2 puffs into the lungs every 6 (six) hours as needed for wheezing or shortness of breath. 18 g 11  . cloNIDine (CATAPRES) 0.1 MG tablet Take 1 tablet by mouth twice daily 180 tablet 0  . cyanocobalamin 2000 MCG tablet Take 2,000 mcg by mouth daily.    . diphenhydramine-acetaminophen (TYLENOL PM) 25-500 MG TABS tablet Take 1 tablet by mouth at bedtime as needed.    Marland Kitchen estradiol (ESTRACE) 0.1 MG/GM vaginal cream     . ferrous sulfate 325 (65 FE) MG tablet Take 325 mg by mouth daily with breakfast.    . fluticasone (FLONASE) 50 MCG/ACT nasal spray Place 2 sprays into both nostrils daily. (Patient taking differently: Place 2 sprays into both nostrils  daily as needed for allergies.) 16 g 6  . hydroxychloroquine (PLAQUENIL) 200 MG tablet Take 1 tablet (200 mg total) by mouth 2 (two) times daily. 60 tablet 2  . lisinopril (ZESTRIL) 10 MG tablet Take 1 tablet by mouth once daily 90 tablet 0  . ONETOUCH ULTRA test strip USE TO CHECK BLOOD SUGAR ONCE DAILY IN THE MORNING    . PARoxetine (PAXIL-CR) 25 MG 24 hr tablet Take 1 tablet by mouth once daily 90 tablet 1  . polyethylene glycol (MIRALAX / GLYCOLAX) 17 g packet Take 17 g by mouth daily. Use daily as needed for constipation    . rOPINIRole (REQUIP) 1 MG tablet TAKE 1 TABLET EVERY MORNINGAND 2 TABLETS AT BEDTIME 270 tablet 3  . Tiotropium Bromide-Olodaterol (STIOLTO RESPIMAT) 2.5-2.5 MCG/ACT AERS Inhale 2 puffs into the lungs daily. 4 g 11  . Tofacitinib Citrate (XELJANZ) 5 MG TABS Take  5 mg by mouth daily.     . Vitamin D, Ergocalciferol, (DRISDOL) 1.25 MG (50000 UNIT) CAPS capsule Take 1 capsule by mouth once a week 12 capsule 3  . atorvastatin (LIPITOR) 40 MG tablet Take 1 tablet by mouth once daily 90 tablet 0  . ciprofloxacin (CIPRO) 250 MG tablet Take 1 tablet (250 mg total) by mouth 2 (two) times daily. (Patient not taking: Reported on 01/10/2021) 14 tablet 0  . gabapentin (NEURONTIN) 300 MG capsule Take 1 capsule by mouth twice daily 180 capsule 0  . HYDROcodone-acetaminophen (NORCO/VICODIN) 5-325 MG tablet Take 1 tablet by mouth every 6 (six) hours as needed for moderate pain. 120 tablet 0  . omeprazole (PRILOSEC) 40 MG capsule Take 1 capsule (40 mg total) by mouth 2 (two) times daily. 180 capsule 3   No facility-administered medications prior to visit.    Allergies  Allergen Reactions  . Sulfa Antibiotics Nausea And Vomiting  . Actemra [Tocilizumab]   . Amlodipine   . Celebrex [Celecoxib]   . Methotrexate Derivatives Other (See Comments)    sweating  . Niaspan [Niacin]     Severe flushing  . Remicade [Infliximab] Nausea And Vomiting and Other (See Comments)    Affects nervous system  . Welchol [Colesevelam] Other (See Comments)    Abdominal pain and headache    Review of Systems  Constitutional: Negative for chills and fever.  HENT: Positive for ear pain (right. Using peroxide. ). Negative for congestion and sore throat.   Respiratory: Positive for cough (baseline). Negative for shortness of breath.   Cardiovascular: Negative for chest pain.  Gastrointestinal: Positive for constipation (taking laxative which helps. ). Negative for abdominal pain, diarrhea, nausea and vomiting.       No gerd   Genitourinary: Positive for difficulty urinating. Negative for flank pain.       Objective:    Physical Exam Vitals reviewed.  Constitutional:      Appearance: Normal appearance. She is obese.  HENT:     Right Ear: Tympanic membrane and ear canal normal.      Left Ear: Tympanic membrane and ear canal normal.  Cardiovascular:     Rate and Rhythm: Normal rate and regular rhythm.     Heart sounds: Normal heart sounds.  Pulmonary:     Effort: Pulmonary effort is normal. No respiratory distress.     Breath sounds: Normal breath sounds.  Abdominal:     General: Abdomen is flat. Bowel sounds are normal.     Palpations: Abdomen is soft.     Tenderness: There is no abdominal tenderness.  Neurological:     Mental Status: She is alert and oriented to person, place, and time.  Psychiatric:        Mood and Affect: Mood normal.        Behavior: Behavior normal.   BL lower legs: red macules sporadic.    BP 138/64   Pulse 74   Temp (!) 97.2 F (36.2 C)   Resp 16   Ht 5' (1.524 m)   Wt 198 lb (89.8 kg)   BMI 38.67 kg/m  Wt Readings from Last 3 Encounters:  03/30/21 190 lb (86.2 kg)  03/20/21 198 lb (89.8 kg)  01/08/21 182 lb (82.6 kg)    Health Maintenance Due  Topic Date Due  . OPHTHALMOLOGY EXAM  Never done  . Hepatitis C Screening  Never done  . Zoster Vaccines- Shingrix (1 of 2) Never done    There are no preventive care reminders to display for this patient.   Lab Results  Component Value Date   TSH 2.080 03/30/2021   Lab Results  Component Value Date   WBC 7.3 03/30/2021   HGB 10.9 (L) 03/30/2021   HCT 33.2 (L) 03/30/2021   MCV 83 03/30/2021   PLT 186 03/30/2021   Lab Results  Component Value Date   NA 142 03/30/2021   K 4.4 03/30/2021   CO2 21 03/30/2021   GLUCOSE 90 03/30/2021   BUN 26 03/30/2021   CREATININE 1.22 (H) 03/30/2021   BILITOT 0.3 03/30/2021   ALKPHOS 76 03/30/2021   AST 27 03/30/2021   ALT 19 03/30/2021   PROT 6.9 03/30/2021   ALBUMIN 4.5 03/30/2021   CALCIUM 9.3 03/30/2021   EGFR 45 (L) 03/30/2021   Lab Results  Component Value Date   CHOL 169 03/30/2021   Lab Results  Component Value Date   HDL 77 03/30/2021   Lab Results  Component Value Date   LDLCALC 76 03/30/2021   Lab  Results  Component Value Date   TRIG 86 03/30/2021   Lab Results  Component Value Date   CHOLHDL 2.2 03/30/2021   Lab Results  Component Value Date   HGBA1C 6.0 (H) 03/30/2021       Assessment & Plan:  1. Acute cystitis with hematuria Start cipro. - POCT urinalysis dipstick - ciprofloxacin (CIPRO) 250 MG tablet; Take 1 tablet (250 mg total) by mouth 2 (two) times daily for 7 days.  Dispense: 14 tablet; Refill: 0  2. Benign skin growth on BL lower legs: continue to use triamcinalone cream, of her husband's, and it is helping.     Meds ordered this encounter  Medications  . ciprofloxacin (CIPRO) 250 MG tablet    Sig: Take 1 tablet (250 mg total) by mouth 2 (two) times daily for 7 days.    Dispense:  14 tablet    Refill:  0  . HYDROcodone-acetaminophen (NORCO/VICODIN) 5-325 MG tablet    Sig: Take 1 tablet by mouth every 6 (six) hours as needed for moderate pain.    Dispense:  120 tablet    Refill:  0    Orders Placed This Encounter  Procedures  . POCT urinalysis dipstick      Follow-up: No follow-ups on file.  An After Visit Summary was printed and given to the patient.  [name], MD [facility] [phone]

## 2021-03-21 LAB — URINE CULTURE

## 2021-03-30 ENCOUNTER — Encounter: Payer: Self-pay | Admitting: Family Medicine

## 2021-03-30 ENCOUNTER — Other Ambulatory Visit: Payer: Self-pay

## 2021-03-30 ENCOUNTER — Ambulatory Visit (INDEPENDENT_AMBULATORY_CARE_PROVIDER_SITE_OTHER): Payer: Medicare Other | Admitting: Family Medicine

## 2021-03-30 VITALS — BP 144/84 | HR 88 | Temp 96.9°F | Resp 18 | Ht 60.0 in | Wt 190.0 lb

## 2021-03-30 DIAGNOSIS — M05711 Rheumatoid arthritis with rheumatoid factor of right shoulder without organ or systems involvement: Secondary | ICD-10-CM

## 2021-03-30 DIAGNOSIS — I10 Essential (primary) hypertension: Secondary | ICD-10-CM

## 2021-03-30 DIAGNOSIS — G8929 Other chronic pain: Secondary | ICD-10-CM

## 2021-03-30 DIAGNOSIS — F330 Major depressive disorder, recurrent, mild: Secondary | ICD-10-CM

## 2021-03-30 DIAGNOSIS — J849 Interstitial pulmonary disease, unspecified: Secondary | ICD-10-CM | POA: Diagnosis not present

## 2021-03-30 DIAGNOSIS — E785 Hyperlipidemia, unspecified: Secondary | ICD-10-CM | POA: Diagnosis not present

## 2021-03-30 DIAGNOSIS — M545 Low back pain, unspecified: Secondary | ICD-10-CM

## 2021-03-30 DIAGNOSIS — F1120 Opioid dependence, uncomplicated: Secondary | ICD-10-CM

## 2021-03-30 DIAGNOSIS — E1169 Type 2 diabetes mellitus with other specified complication: Secondary | ICD-10-CM | POA: Diagnosis not present

## 2021-03-30 DIAGNOSIS — G2581 Restless legs syndrome: Secondary | ICD-10-CM

## 2021-03-30 DIAGNOSIS — M05712 Rheumatoid arthritis with rheumatoid factor of left shoulder without organ or systems involvement: Secondary | ICD-10-CM

## 2021-03-30 DIAGNOSIS — N3000 Acute cystitis without hematuria: Secondary | ICD-10-CM

## 2021-03-30 DIAGNOSIS — K219 Gastro-esophageal reflux disease without esophagitis: Secondary | ICD-10-CM

## 2021-03-30 DIAGNOSIS — J410 Simple chronic bronchitis: Secondary | ICD-10-CM

## 2021-03-30 LAB — POCT URINALYSIS DIPSTICK
Bilirubin, UA: NEGATIVE
Blood, UA: NEGATIVE
Glucose, UA: NEGATIVE
Ketones, UA: NEGATIVE
Nitrite, UA: NEGATIVE
Protein, UA: POSITIVE — AB
Spec Grav, UA: 1.02 (ref 1.010–1.025)
Urobilinogen, UA: 0.2 E.U./dL
pH, UA: 6 (ref 5.0–8.0)

## 2021-03-30 MED ORDER — NITROFURANTOIN MONOHYD MACRO 100 MG PO CAPS: 100.0000 mg | ORAL_CAPSULE | Freq: Two times a day (BID) | ORAL | 2 refills | Status: DC

## 2021-03-30 NOTE — Patient Instructions (Signed)
Recommend calcium citrate with D 600 mg one twice a day.  Start on nitrofurantoin one twice a day for 1 week, then decrease to once daily as maintenance.

## 2021-03-30 NOTE — Progress Notes (Signed)
Subjective:  Patient ID: Sherry Burke, female    DOB: 1940-11-12  Age: 80 y.o. MRN: [id number]  Chief Complaint  Patient presents with  . Hypertension  . COPD  . Diabetes    HPI Diabetes: check sugars once every 2 weeks. Running 119-140s fasting. No polydipsia, polyuria, or neuropathy. Has polyphagia. Does not eat healthy. Does not eat a lot of fruits because of loss of teeth. Avoids fried foods.  RLS: on gabapentin.  Uses legs and arms to use manual wheelchair. Moves around house.  Chronic bronchitis: Stiolto helps.  Hyperlipidemia: on lipitor 40 mg once daily.  GERD: omeprazole for reflux. RA: on xeljanz, plaquenil. Take hydrocodone 5/325 mg one every 6 hours.  UTI: Leuks 3+. Completed antibiotic. HTN: ON clonidine 0.1 mg one twice a day and lisinopril 10 mg once daily.    Current Outpatient Medications on File Prior to Visit  Medication Sig Dispense Refill  . albuterol (VENTOLIN HFA) 108 (90 Base) MCG/ACT inhaler Inhale 2 puffs into the lungs every 6 (six) hours as needed for wheezing or shortness of breath. 18 g 11  . cloNIDine (CATAPRES) 0.1 MG tablet Take 1 tablet by mouth twice daily 180 tablet 0  . cyanocobalamin 2000 MCG tablet Take 2,000 mcg by mouth daily.    . diphenhydramine-acetaminophen (TYLENOL PM) 25-500 MG TABS tablet Take 1 tablet by mouth at bedtime as needed.    Marland Kitchen estradiol (ESTRACE) 0.1 MG/GM vaginal cream     . ferrous sulfate 325 (65 FE) MG tablet Take 325 mg by mouth daily with breakfast.    . fluticasone (FLONASE) 50 MCG/ACT nasal spray Place 2 sprays into both nostrils daily. (Patient taking differently: Place 2 sprays into both nostrils daily as needed for allergies.) 16 g 6  . HYDROcodone-acetaminophen (NORCO/VICODIN) 5-325 MG tablet Take 1 tablet by mouth every 6 (six) hours as needed for moderate pain. 120 tablet 0  . hydroxychloroquine (PLAQUENIL) 200 MG tablet Take 1 tablet (200 mg total) by mouth 2 (two) times daily. 60 tablet 2  . lisinopril  (ZESTRIL) 10 MG tablet Take 1 tablet by mouth once daily 90 tablet 0  . ONETOUCH ULTRA test strip USE TO CHECK BLOOD SUGAR ONCE DAILY IN THE MORNING    . PARoxetine (PAXIL-CR) 25 MG 24 hr tablet Take 1 tablet by mouth once daily 90 tablet 1  . polyethylene glycol (MIRALAX / GLYCOLAX) 17 g packet Take 17 g by mouth daily. Use daily as needed for constipation    . rOPINIRole (REQUIP) 1 MG tablet TAKE 1 TABLET EVERY MORNINGAND 2 TABLETS AT BEDTIME 270 tablet 3  . Tiotropium Bromide-Olodaterol (STIOLTO RESPIMAT) 2.5-2.5 MCG/ACT AERS Inhale 2 puffs into the lungs daily. 4 g 11  . Tofacitinib Citrate (XELJANZ) 5 MG TABS Take 5 mg by mouth daily.     . Vitamin D, Ergocalciferol, (DRISDOL) 1.25 MG (50000 UNIT) CAPS capsule Take 1 capsule by mouth once a week 12 capsule 3   No current facility-administered medications on file prior to visit.   Past Medical History:  Diagnosis Date  . Acute renal insufficiency   . Asthma   . Chronic bronchitis   . COPD (chronic obstructive pulmonary disease) ([place])   . Depression   . History of migraine headaches   . Hyperlipemia   . OSA (obstructive sleep apnea)    pt states she doesn't have it  . Osteoarthritis   . Pneumonia   . Rheumatoid arthritis(714.0)   . RLS (restless legs syndrome)   .  Vitamin D deficiency disease    Past Surgical History:  Procedure Laterality Date  . KNEE SURGERY Bilateral    when younger  . TOTAL ABDOMINAL HYSTERECTOMY  06/1988    Family History  Problem Relation Age of Onset  . Lung cancer Father   . Stroke Mother   . Hypertension Mother   . Diabetes Mother   . Rheum arthritis Sister   . Rheum arthritis Sister    Social History   Socioeconomic History  . Marital status: Married    Spouse name: Durene Fruits  . Number of children: 3  . Years of education: Not on file  . Highest education level: Not on file  Occupational History  . Occupation: retired    Comment: homemaker  Tobacco Use  . Smoking status: Passive Smoke  Exposure - Never Smoker  . Smokeless tobacco: Never Used  Vaping Use  . Vaping Use: Never used  Substance and Sexual Activity  . Alcohol use: No  . Drug use: No  . Sexual activity: Not Currently    Birth control/protection: None    Comment: Married  Other Topics Concern  . Not on file  Social History Narrative   Lives with her husband, who is very helpful and supportive.   Social Determinants of Health   Financial Resource Strain: Not on file  Food Insecurity: No Food Insecurity  . Worried About Charity fundraiser in the Last Year: Never true  . Ran Out of Food in the Last Year: Never true  Transportation Needs: Not on file  Physical Activity: Not on file  Stress: Not on file  Social Connections: Not on file    Review of Systems  Constitutional: Positive for fatigue. Negative for chills and fever.  HENT: Negative for congestion, ear pain, rhinorrhea and sore throat.   Respiratory: Positive for cough and shortness of breath.   Cardiovascular: Negative for chest pain and palpitations.  Gastrointestinal: Positive for constipation. Negative for abdominal pain, diarrhea, nausea and vomiting.  Genitourinary: Negative for dysuria and urgency.  Musculoskeletal: Positive for arthralgias, back pain and myalgias.  Neurological: Positive for weakness. Negative for dizziness, light-headedness and headaches.  Psychiatric/Behavioral: Negative for dysphoric mood. The patient is not nervous/anxious.      Objective:  BP (!) 144/84   Pulse 88   Temp (!) 96.9 F (36.1 C)   Resp 18   Ht 5' (1.524 m)   Wt 190 lb (86.2 kg)   BMI 37.11 kg/m   BP/Weight 03/30/2021 03/20/2021 03/20/2584  Systolic BP 277 824 235  Diastolic BP 84 64 84  Wt. (Lbs) 190 198 182  BMI 37.11 38.67 34.39    Physical Exam Vitals reviewed.  Constitutional:      Appearance: Normal appearance. She is obese.  Neck:     Vascular: No carotid bruit.  Cardiovascular:     Rate and Rhythm: Normal rate and regular  rhythm.     Pulses: Normal pulses.     Heart sounds: Normal heart sounds.  Pulmonary:     Effort: Pulmonary effort is normal. No respiratory distress.     Breath sounds: Normal breath sounds.  Abdominal:     General: Abdomen is flat. Bowel sounds are normal.     Palpations: Abdomen is soft.     Tenderness: There is no abdominal tenderness.  Neurological:     Mental Status: She is alert and oriented to person, place, and time.  Psychiatric:        Mood and Affect: Mood  normal.        Behavior: Behavior normal.     Diabetic Foot Exam - Simple   No data filed      Lab Results  Component Value Date   WBC 7.3 03/30/2021   HGB 10.9 (L) 03/30/2021   HCT 33.2 (L) 03/30/2021   PLT 186 03/30/2021   GLUCOSE 90 03/30/2021   CHOL 169 03/30/2021   TRIG 86 03/30/2021   HDL 77 03/30/2021   LDLCALC 76 03/30/2021   ALT 19 03/30/2021   AST 27 03/30/2021   NA 142 03/30/2021   K 4.4 03/30/2021   CL 104 03/30/2021   CREATININE 1.22 (H) 03/30/2021   BUN 26 03/30/2021   CO2 21 03/30/2021   TSH 2.080 03/30/2021   HGBA1C 6.0 (H) 03/30/2021      Assessment & Plan:   1. Acute cystitis without hematuria Recurrent UTIs. Start on nitrofurantoin one twice a day for 1 week, then decrease to once daily as maintenance.  - Urine Culture - POCT urinalysis dipstick - nitrofurantoin, macrocrystal-monohydrate, (MACROBID) 100 MG capsule; Take 1 capsule (100 mg total) by mouth 2 (two) times daily.  Dispense: 60 capsule; Refill: 2  2. Dyslipidemia associated with type 2 diabetes mellitus (HCC) Control: good Recommend check sugars when feels sick Recommend check feet daily. Recommend annual eye exams. Medicines: none Continue to work on eating a healthy diet and exercise.  Labs drawn today.   - CBC with Differential/Platelet - Comprehensive metabolic panel - Hemoglobin A1c - Lipid panel  3. Essential hypertension Not quite at goal,  But for her age the sbp goal 140s. - TSH  4. ILD  (interstitial lung disease) ([place]) Follow up with pulmonology,  5. Simple chronic bronchitis (HCC) The current medical regimen is effective;  continue present plan and medications.  6. GERD without esophagitis The current medical regimen is effective;  continue present plan and medications.  7. Rheumatoid arthritis involving both shoulders with positive rheumatoid factor (HCC) Fairly effective, but still hurts a lot. The current medical regimen is effective;  continue present plan and medications.  8. RLS (restless legs syndrome) The current medical regimen is effective;  continue present plan and medications.  9. Mild recurrent major depression ([place])  The current medical regimen is effective;  continue present plan and medications.  10. Opioid dependence/Chronic low back pain The current medical regimen is fairly effective;  continue present plan and medications. Continue Hydrocodone/apap.   Meds ordered this encounter  Medications  . nitrofurantoin, macrocrystal-monohydrate, (MACROBID) 100 MG capsule    Sig: Take 1 capsule (100 mg total) by mouth 2 (two) times daily.    Dispense:  60 capsule    Refill:  2    Orders Placed This Encounter  Procedures  . Urine Culture  . CBC with Differential/Platelet  . Comprehensive metabolic panel  . Hemoglobin A1c  . Lipid panel  . TSH  . Cardiovascular Risk Assessment  . POCT urinalysis dipstick     Follow-up: Return in about 3 months (around 06/30/2021) for fasting.  An After Visit Summary was printed and given to the patient.  [name], MD [facility] [phone]

## 2021-03-31 LAB — COMPREHENSIVE METABOLIC PANEL
ALT: 19 IU/L (ref 0–32)
AST: 27 IU/L (ref 0–40)
Albumin/Globulin Ratio: 1.9 (ref 1.2–2.2)
Albumin: 4.5 g/dL (ref 3.7–4.7)
Alkaline Phosphatase: 76 IU/L (ref 44–121)
BUN/Creatinine Ratio: 21 (ref 12–28)
BUN: 26 mg/dL (ref 8–27)
Bilirubin Total: 0.3 mg/dL (ref 0.0–1.2)
CO2: 21 mmol/L (ref 20–29)
Calcium: 9.3 mg/dL (ref 8.7–10.3)
Chloride: 104 mmol/L (ref 96–106)
Creatinine, Ser: 1.22 mg/dL — ABNORMAL HIGH (ref 0.57–1.00)
Globulin, Total: 2.4 g/dL (ref 1.5–4.5)
Glucose: 90 mg/dL (ref 65–99)
Potassium: 4.4 mmol/L (ref 3.5–5.2)
Sodium: 142 mmol/L (ref 134–144)
Total Protein: 6.9 g/dL (ref 6.0–8.5)
eGFR: 45 mL/min/{1.73_m2} — ABNORMAL LOW (ref >59)

## 2021-03-31 LAB — CBC WITH DIFFERENTIAL/PLATELET
Basophils Absolute: 0.1 10*3/uL (ref 0.0–0.2)
Basos: 1 %
EOS (ABSOLUTE): 0.3 10*3/uL (ref 0.0–0.4)
Eos: 4 %
Hematocrit: 33.2 % — ABNORMAL LOW (ref 34.0–46.6)
Hemoglobin: 10.9 g/dL — ABNORMAL LOW (ref 11.1–15.9)
Immature Grans (Abs): 0 10*3/uL (ref 0.0–0.1)
Immature Granulocytes: 1 %
Lymphocytes Absolute: 1.1 10*3/uL (ref 0.7–3.1)
Lymphs: 15 %
MCH: 27.3 pg (ref 26.6–33.0)
MCHC: 32.8 g/dL (ref 31.5–35.7)
MCV: 83 fL (ref 79–97)
Monocytes Absolute: 0.7 10*3/uL (ref 0.1–0.9)
Monocytes: 9 %
Neutrophils Absolute: 5.1 10*3/uL (ref 1.4–7.0)
Neutrophils: 70 %
Platelets: 186 10*3/uL (ref 150–450)
RBC: 3.99 x10E6/uL (ref 3.77–5.28)
RDW: 16 % — ABNORMAL HIGH (ref 11.7–15.4)
WBC: 7.3 10*3/uL (ref 3.4–10.8)

## 2021-03-31 LAB — LIPID PANEL
Chol/HDL Ratio: 2.2 ratio (ref 0.0–4.4)
Cholesterol, Total: 169 mg/dL (ref 100–199)
HDL: 77 mg/dL (ref >39)
LDL Chol Calc (NIH): 76 mg/dL (ref 0–99)
Triglycerides: 86 mg/dL (ref 0–149)
VLDL Cholesterol Cal: 16 mg/dL (ref 5–40)

## 2021-03-31 LAB — TSH: TSH: 2.08 u[IU]/mL (ref 0.450–4.500)

## 2021-03-31 LAB — HEMOGLOBIN A1C
Est. average glucose Bld gHb Est-mCnc: 126 mg/dL
Hgb A1c MFr Bld: 6 % — ABNORMAL HIGH (ref 4.8–5.6)

## 2021-03-31 LAB — CARDIOVASCULAR RISK ASSESSMENT

## 2021-04-01 LAB — URINE CULTURE: Organism ID, Bacteria: NO GROWTH

## 2021-04-02 ENCOUNTER — Other Ambulatory Visit: Payer: Self-pay | Admitting: Family Medicine

## 2021-04-02 DIAGNOSIS — K219 Gastro-esophageal reflux disease without esophagitis: Secondary | ICD-10-CM

## 2021-04-10 ENCOUNTER — Encounter: Payer: Self-pay | Admitting: Family Medicine

## 2021-04-12 ENCOUNTER — Ambulatory Visit: Payer: Medicare Other | Admitting: Family Medicine

## 2021-04-13 DIAGNOSIS — Z6835 Body mass index (BMI) 35.0-35.9, adult: Secondary | ICD-10-CM | POA: Diagnosis not present

## 2021-04-13 DIAGNOSIS — E669 Obesity, unspecified: Secondary | ICD-10-CM | POA: Diagnosis not present

## 2021-04-13 DIAGNOSIS — M0630 Rheumatoid nodule, unspecified site: Secondary | ICD-10-CM | POA: Diagnosis not present

## 2021-04-13 DIAGNOSIS — M150 Primary generalized (osteo)arthritis: Secondary | ICD-10-CM | POA: Diagnosis not present

## 2021-04-13 DIAGNOSIS — M0579 Rheumatoid arthritis with rheumatoid factor of multiple sites without organ or systems involvement: Secondary | ICD-10-CM | POA: Diagnosis not present

## 2021-04-13 DIAGNOSIS — Z79899 Other long term (current) drug therapy: Secondary | ICD-10-CM | POA: Diagnosis not present

## 2021-04-13 DIAGNOSIS — M48061 Spinal stenosis, lumbar region without neurogenic claudication: Secondary | ICD-10-CM | POA: Diagnosis not present

## 2021-04-13 DIAGNOSIS — J849 Interstitial pulmonary disease, unspecified: Secondary | ICD-10-CM | POA: Diagnosis not present

## 2021-04-20 ENCOUNTER — Telehealth: Payer: Self-pay

## 2021-04-20 NOTE — Progress Notes (Signed)
Chronic Care Management Pharmacy Assistant   Name: Dyann Goodspeed  MRN: [id number] DOB: [birth year]  Sherry Burke is an 80 y.o. year old female who presents for her follow-up CCM visit with the clinical pharmacist.  Reason for Encounter: Disease State call for COPD and General adherence    Recent office visits:  03/30/2021: Dr. Tobie Poet (PCP) / labs ordered / urine culture/ added Nitrofurantoin 100mg  twice daily  03/20/2021:Dr. Cox (PCP) urinalysis dipstick / no medication changes noted   Recent consult visits:  None noted since last CCM visit   Hospital visits:  None noted since last CCM visit   Medications: Outpatient Encounter Medications as of 04/20/2021  Medication Sig   albuterol (VENTOLIN HFA) 108 (90 Base) MCG/ACT inhaler Inhale 2 puffs into the lungs every 6 (six) hours as needed for wheezing or shortness of breath.   atorvastatin (LIPITOR) 40 MG tablet Take 1 tablet by mouth once daily   cloNIDine (CATAPRES) 0.1 MG tablet Take 1 tablet by mouth twice daily   cyanocobalamin 2000 MCG tablet Take 2,000 mcg by mouth daily.   diphenhydramine-acetaminophen (TYLENOL PM) 25-500 MG TABS tablet Take 1 tablet by mouth at bedtime as needed.   estradiol (ESTRACE) 0.1 MG/GM vaginal cream    ferrous sulfate 325 (65 FE) MG tablet Take 325 mg by mouth daily with breakfast.   fluticasone (FLONASE) 50 MCG/ACT nasal spray Place 2 sprays into both nostrils daily. (Patient taking differently: Place 2 sprays into both nostrils daily as needed for allergies.)   gabapentin (NEURONTIN) 300 MG capsule Take 1 capsule by mouth twice daily   HYDROcodone-acetaminophen (NORCO/VICODIN) 5-325 MG tablet Take 1 tablet by mouth every 6 (six) hours as needed for moderate pain.   hydroxychloroquine (PLAQUENIL) 200 MG tablet Take 1 tablet (200 mg total) by mouth 2 (two) times daily.   lisinopril (ZESTRIL) 10 MG tablet Take 1 tablet by mouth once daily   nitrofurantoin, macrocrystal-monohydrate, (MACROBID) 100 MG  capsule Take 1 capsule (100 mg total) by mouth 2 (two) times daily.   omeprazole (PRILOSEC) 40 MG capsule Take 1 capsule by mouth twice daily   ONETOUCH ULTRA test strip USE TO CHECK BLOOD SUGAR ONCE DAILY IN THE MORNING   PARoxetine (PAXIL-CR) 25 MG 24 hr tablet Take 1 tablet by mouth once daily   polyethylene glycol (MIRALAX / GLYCOLAX) 17 g packet Take 17 g by mouth daily. Use daily as needed for constipation   rOPINIRole (REQUIP) 1 MG tablet TAKE 1 TABLET EVERY MORNINGAND 2 TABLETS AT BEDTIME   Tiotropium Bromide-Olodaterol (STIOLTO RESPIMAT) 2.5-2.5 MCG/ACT AERS Inhale 2 puffs into the lungs daily.   Tofacitinib Citrate (XELJANZ) 5 MG TABS Take 5 mg by mouth daily.    Vitamin D, Ergocalciferol, (DRISDOL) 1.25 MG (50000 UNIT) CAPS capsule Take 1 capsule by mouth once a week   No facility-administered encounter medications on file as of 04/20/2021.    Current COPD regimen:   Albuterol inhaler, 2 puffs q6h prn wheezing or SOB.Beau Fanny respimat, 2 puffs qd     No flowsheet data found.  Any recent hospitalizations or ED visits since last visit with CPP? No   reports the following COPD symptoms, shortness of breath with increased activity like housework.    What recent interventions/DTPs have been made by any provider to improve breathing since last visit: No   Have you had exacerbation/flare-up since last visit? Patient stated that she flares up when getting ready to go to church on Sundays  and when she does housework. She reported that making up the bed wears her out. She does still cook and enjoys it and it does not bother her breathing.   What do you do when you are short of breath?  Patient stated that she has to sit down and rest for awhile and use her rescue inhaler.    Respiratory Devices/Equipment Do you have a nebulizer? Yes, but she stated that she never uses it.   Do you use a Peak Flow Meter? No   Do you use a maintenance inhaler? Yes   How often do you forget  to use your daily inhaler?  Never   Do you use a rescue inhaler? Patient stated that she uses rescue inhaler once a day   Do you use a spacer with your inhaler? Yes   Adherence Review: Does the patient have >5 day gap between last estimated fill date for maintenance inhaler medications? No, CPP to review   Care Gaps: Last annual wellness visit?  Patient stated that she recently missed her appointment because her husband was sick and unable to bring her to the office. She reported that she will call the office and get this rescheduled.    STAR RATING DRUGS:   Medication:                  Last filled  Days supply Atorvastatin 40mg          04/04/2021      90DS Lisinopril 10mg .            04/04/2021     [place], [place] Clinical Pharmacist Assistant

## 2021-05-21 ENCOUNTER — Other Ambulatory Visit: Payer: Self-pay

## 2021-05-21 MED ORDER — HYDROCODONE-ACETAMINOPHEN 5-325 MG PO TABS: 1.0000 | ORAL_TABLET | Freq: Four times a day (QID) | ORAL | 0 refills | Status: DC | PRN

## 2021-05-23 ENCOUNTER — Other Ambulatory Visit: Payer: Self-pay | Admitting: Family Medicine

## 2021-05-23 ENCOUNTER — Other Ambulatory Visit: Payer: Self-pay | Admitting: Physician Assistant

## 2021-05-23 DIAGNOSIS — F331 Major depressive disorder, recurrent, moderate: Secondary | ICD-10-CM

## 2021-05-24 ENCOUNTER — Other Ambulatory Visit: Payer: Self-pay

## 2021-05-24 ENCOUNTER — Ambulatory Visit (INDEPENDENT_AMBULATORY_CARE_PROVIDER_SITE_OTHER): Payer: Medicare Other | Admitting: Primary Care

## 2021-05-24 ENCOUNTER — Encounter: Payer: Self-pay | Admitting: Primary Care

## 2021-05-24 VITALS — BP 118/70 | HR 81 | Ht 60.0 in | Wt 200.2 lb

## 2021-05-24 DIAGNOSIS — J849 Interstitial pulmonary disease, unspecified: Secondary | ICD-10-CM

## 2021-05-24 DIAGNOSIS — M05712 Rheumatoid arthritis with rheumatoid factor of left shoulder without organ or systems involvement: Secondary | ICD-10-CM

## 2021-05-24 DIAGNOSIS — M05711 Rheumatoid arthritis with rheumatoid factor of right shoulder without organ or systems involvement: Secondary | ICD-10-CM

## 2021-05-24 DIAGNOSIS — J410 Simple chronic bronchitis: Secondary | ICD-10-CM | POA: Diagnosis not present

## 2021-05-24 MED ORDER — PREDNISONE 10 MG PO TABS: ORAL_TABLET | ORAL | 0 refills | Status: AC

## 2021-05-24 NOTE — Patient Instructions (Addendum)
Very nice seeing you today Ms Struthers   Recommendations: - Continue Stiolto 2 puffs in the morning - Use albuterol every 4-6 hours as needed - Use incentive spirometer 10x/hour while awake when possible   Orders: - Needs spirometry with DLCO first available - HRCT first available   Rx:  - Prednisone taper as directed   Follow-up: - Needs to establish with either Dr. Vaughan Browner August 30th at 10am (30 mins)

## 2021-05-24 NOTE — Assessment & Plan Note (Signed)
-   Patient reports worsening shortness of breath over the last several months. She has been less mobile d/t arthritis symptom and weight gain. On exam she has coarse rales at her lung bases. Needs spirometry with DLCO and HRCT to assess for progression of RA-ILD. Sending in prednisone taper (20mg  x 5 days, 10mg  x 5 days, 5mg  x 5 days). She will establish with Dr. Vaughan Browner in August as new patient.

## 2021-05-24 NOTE — Assessment & Plan Note (Addendum)
-   Continue Stiolto respimat 2 puffs once daily - She declined pulmonary rehab, no feasible at this time

## 2021-05-24 NOTE — Progress Notes (Signed)
@Patient  ID: Sherry Burke, female    DOB: 1941/01/31, 80 y.o.   MRN: [id number]  Chief Complaint  Patient presents with   Follow-up    F/U COPD. Reports she feels like SOB is worse than her baseline over last 2-3 months. She reports getting more SOB with minimal exertion. Using stiolto daily and does not feel like the albuterol is helping however she uses it 3-4x/day. Productive cough with thick yellow mucous.     Referring provider: [name], MD  HPI: 80 year old female, never smoked (passive exposure). PMH significant for COPD, RA-ILD, OSA, hypertension, GERD, rheumatoid arthritis, hyperlipidemia. Former patient of Dr. Carlis Burke, last seen on 08/01/20 by pulmonary NP.   Previous [facility] pulmonary encounters: OV 04/12/20: Sherry Burke is a 80 year old woman who presents for follow-up.  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  This was stopped several years ago due to concern for methotrexate induced lung toxicity.  Unfortunately her joint symptoms have been difficult to control with other medication regimens due to side effects or lack of efficacy.  She is currently on reduced dose Xeljanz as she had intolerable nausea on full dose.  Her symptoms are improved with taking it once daily.  At her last visit with Sherry Barrow, NP on 03/10/2020 she had not noticed a significant benefit from [facility].  She was started on Spiriva once daily.  Since then she has had issues with thrush, which have improved since stopping and taking a break.  She does not have a sore throat.  She is sure to rinse her mouth after every use and is currently back to taking it once daily.  Her shortness of breath is stable; she has not noticed a significant benefit from inhalers.  05/17/20 Sherry Burke is a 80 y/o woman who is following up since starting Spiriva once daily.  She has noticed an improvement in shortness of breath and gets short of breath less frequently.  Overall she still has shortness  of breath that she associates with age and chronic lung disease, but feels that it is improved.  She initially had mild thrush, which is improved with rinsing her mouth more aggressively.  She has not been using albuterol frequently, but is requesting an albuterol refill.  She has an appointment with Dr. Lenna Burke from rheumatology later this month.  She is not currently on methotrexate, but it may be required as a backup if her current medication is not working.  She has some arthralgias now, but is resistant to taking hydrocodone at this time.   08/01/2020  - Visit 80 year old female former smoker upon our office for COPD and interstitial lung disease she was last seen by Dr. Carlis Burke in July/2021 this was a virtual visit plan of care from that visit was as follows to continue Spiriva once daily.  Could consider escalating to Stiolto if symptoms worsen.  Patient was encouraged to restart methotrexate.  Plan will be to obtain pulmonary function testing 3 months after starting for surveillance.  Plan was to repeat a high-resolution CT chest in 1 year.  She was recommended to follow-up in 3 months.   Patient reports that she never restarted methotrexate she is managed by Dr. Trudie Burke with rheumatology for her rheumatoid arthritis.  She is currently managed on [place].  She is only taking 1 tablet daily due to GI upset.  They feel that this is adequately controlling her rheumatoid arthritis at this time.  Patient simply wanted to know  whether or not she could restart methotrexate if needed.  She is a follow-up with their office in 1 month.   Patient continues to lead a fairly sedentary lifestyle.  She also struggles with back pain.  This limits her overall physical mobility.  She tries to do chair exercises.   She plans on obtaining the COVID-19 booster when recommended to her.  She plans on obtaining the seasonal flu vaccine when recommended to her.   She does not feel the Spiriva was very helpful in management of  her shortness of breath.  She would like to discuss this today.   05/24/2021- Interim hx  Patient presents today for overdue follow-up. During her last visit in September 2021 she was given trial of Stiolto Respimat in place of Spiriva. She was also ordered for repeat spirometry with DLCO and asked to follow-up in 3 months with either Dr. Vaughan Burke or Sherry Burke. She did not complete PFTs. She is maintained on Stiolto daily and has been using her albuterol inhaler. She has been more short of breath over the last several months. She has been less mobile and has gained some weight. She has a productive cough that is not bothersome to her. Congestion is more upper airway/throat. She has rheumatoid arthritis, she is currently dealing with a lot of back and knee pain. She follows with Dr. Trudie Burke with rheumatology for her history of RA. She is currently on Plaquenil/xeljanz. Recently increased xeljanz and that has helped with her arthritis some.    Pulmonary function testing: 03/10/2020 - FVC 1.60 (71%), FEV1 1.07 (65%), ratio 67, TLC 94%, DLCOcor 11.82 (71%). No BD response   04/10/2011: FVC 2.64 (92%)--> 2.63 (92%, 0% change) FEV1 1.55 L (76%) --> 1.57 L (77%, 1% change) Ratio 59 RV 1.8 (94%)  TLC 4.44 (92%) via nitrogen washout DLCO 20.3 (83%)  flow volume loop consistent with obstruction -Mild obstruction, no significant restriction or diffusion impairment   Imaging: HRCT 09/08/19- Pulmonary parenchymal pattern of predominantly peripheral ground-glass, reticular densities and bronchiolectasis is unchanged from 04/29/2015 and may represent nonspecific interstitial pneumonitis. Given micro nodularity in the right upper lobe and along the fissures, the possibility of sarcoid is not excluded. Findings are indeterminate for UIP     Allergies  Allergen Reactions   Sulfa Antibiotics Nausea And Vomiting   Actemra [Tocilizumab]    Amlodipine    Celebrex [Celecoxib]    Methotrexate Derivatives Other  (See Comments)    sweating   Niaspan [Niacin]     Severe flushing   Remicade [Infliximab] Nausea And Vomiting and Other (See Comments)    Affects nervous system   Welchol [Colesevelam] Other (See Comments)    Abdominal pain and headache    Immunization History  Administered Date(s) Administered   Influenza Whole 08/12/2011   Influenza, High Dose Seasonal PF 08/12/2019, 08/01/2020   Moderna SARS-COV2 Booster Vaccination 09/12/2020, 03/20/2021   Moderna Sars-Covid-2 Vaccination 11/26/2019, 12/24/2019   Pneumococcal Conjugate-13 09/08/2014   Pneumococcal Polysaccharide-23 10/05/2014   Tdap 10/01/2011    Past Medical History:  Diagnosis Date   Acute renal insufficiency    Asthma    Chronic bronchitis    COPD (chronic obstructive pulmonary disease) (HCC)    Depression    History of migraine headaches    Hyperlipemia    OSA (obstructive sleep apnea)    pt states she doesn't have it   Osteoarthritis    Pneumonia    Rheumatoid arthritis(714.0)    RLS (restless legs syndrome)    Vitamin  D deficiency disease     Tobacco History: Social History   Tobacco Use  Smoking Status Passive Smoke Exposure - Never Smoker  Smokeless Tobacco Never   Counseling given: Not Answered   Outpatient Medications Prior to Visit  Medication Sig Dispense Refill   albuterol (VENTOLIN HFA) 108 (90 Base) MCG/ACT inhaler Inhale 2 puffs into the lungs every 6 (six) hours as needed for wheezing or shortness of breath. 18 g 11   atorvastatin (LIPITOR) 40 MG tablet Take 1 tablet by mouth once daily 90 tablet 0   cloNIDine (CATAPRES) 0.1 MG tablet Take 1 tablet by mouth twice daily 180 tablet 0   cyanocobalamin 2000 MCG tablet Take 2,000 mcg by mouth daily.     diphenhydramine-acetaminophen (TYLENOL PM) 25-500 MG TABS tablet Take 1 tablet by mouth at bedtime as needed.     estradiol (ESTRACE) 0.1 MG/GM vaginal cream      ferrous sulfate 325 (65 FE) MG tablet Take 325 mg by mouth daily with breakfast.      fluticasone (FLONASE) 50 MCG/ACT nasal spray Place 2 sprays into both nostrils daily. (Patient taking differently: Place 2 sprays into both nostrils daily as needed for allergies.) 16 g 6   gabapentin (NEURONTIN) 300 MG capsule Take 1 capsule by mouth twice daily 180 capsule 0   HYDROcodone-acetaminophen (NORCO/VICODIN) 5-325 MG tablet Take 1 tablet by mouth every 6 (six) hours as needed for moderate pain. 120 tablet 0   hydroxychloroquine (PLAQUENIL) 200 MG tablet Take 1 tablet (200 mg total) by mouth 2 (two) times daily. 60 tablet 2   lisinopril (ZESTRIL) 10 MG tablet Take 1 tablet by mouth once daily 90 tablet 0   nitrofurantoin, macrocrystal-monohydrate, (MACROBID) 100 MG capsule Take 1 capsule (100 mg total) by mouth 2 (two) times daily. 60 capsule 2   omeprazole (PRILOSEC) 40 MG capsule Take 1 capsule by mouth twice daily 180 capsule 0   ONETOUCH ULTRA test strip USE TO CHECK BLOOD SUGAR ONCE DAILY IN THE MORNING     PARoxetine (PAXIL-CR) 25 MG 24 hr tablet Take 1 tablet by mouth once daily 90 tablet 0   polyethylene glycol (MIRALAX / GLYCOLAX) 17 g packet Take 17 g by mouth daily. Use daily as needed for constipation     rOPINIRole (REQUIP) 1 MG tablet TAKE 1 TABLET EVERY MORNINGAND 2 TABLETS AT BEDTIME 270 tablet 3   Tiotropium Bromide-Olodaterol (STIOLTO RESPIMAT) 2.5-2.5 MCG/ACT AERS Inhale 2 puffs into the lungs daily. 4 g 11   Tofacitinib Citrate (XELJANZ) 5 MG TABS Take 5 mg by mouth daily.      Vitamin D, Ergocalciferol, (DRISDOL) 1.25 MG (50000 UNIT) CAPS capsule Take 1 capsule by mouth once a week 12 capsule 3   No facility-administered medications prior to visit.   Review of Systems  Review of Systems  Constitutional:  Positive for activity change and unexpected weight change.       Weight gain  HENT: Negative.    Respiratory:  Positive for cough, shortness of breath and wheezing. Negative for choking and chest tightness.   Cardiovascular: Negative.   Musculoskeletal:   Positive for arthralgias.   Physical Exam  BP 118/70 (BP Location: Right Arm, Patient Position: Sitting, Cuff Size: Normal)   Pulse 81   Ht 5' (1.524 m)   Wt 200 lb 3.2 oz (90.8 kg)   SpO2 95%   BMI 39.10 kg/m  Physical Exam Constitutional:      Appearance: Normal appearance.  HENT:  Head: Normocephalic and atraumatic.     Mouth/Throat:     Comments: Deferred d/t masking Cardiovascular:     Rate and Rhythm: Normal rate and regular rhythm.  Pulmonary:     Effort: Pulmonary effort is normal.     Breath sounds: Rales present. No wheezing or rhonchi.  Musculoskeletal:     Comments: In rolling WC   Skin:    General: Skin is warm and dry.  Neurological:     General: No focal deficit present.     Mental Status: She is alert and oriented to person, place, and time. Mental status is at baseline.     Lab Results:  CBC    Component Value Date/Time   WBC 7.3 03/30/2021 1129   WBC 7.6 08/23/2013 1829   RBC 3.99 03/30/2021 1129   RBC 4.50 08/23/2013 1829   HGB 10.9 (L) 03/30/2021 1129   HCT 33.2 (L) 03/30/2021 1129   PLT 186 03/30/2021 1129   MCV 83 03/30/2021 1129   MCH 27.3 03/30/2021 1129   MCH 28.7 08/23/2013 1829   MCHC 32.8 03/30/2021 1129   MCHC 32.7 08/23/2013 1829   RDW 16.0 (H) 03/30/2021 1129   LYMPHSABS 1.1 03/30/2021 1129   MONOABS 0.6 08/23/2013 1829   EOSABS 0.3 03/30/2021 1129   BASOSABS 0.1 03/30/2021 1129    BMET    Component Value Date/Time   NA 142 03/30/2021 1129   K 4.4 03/30/2021 1129   CL 104 03/30/2021 1129   CO2 21 03/30/2021 1129   GLUCOSE 90 03/30/2021 1129   GLUCOSE 90 08/23/2013 1829   BUN 26 03/30/2021 1129   CREATININE 1.22 (H) 03/30/2021 1129   CALCIUM 9.3 03/30/2021 1129   GFRNONAA 40 (L) 01/01/2021 1016   GFRAA 46 (L) 01/01/2021 1016    BNP No results found for: BNP  ProBNP No results found for: PROBNP  Imaging: No results found.   Assessment & Plan:   ILD (interstitial lung disease) ([place]) - Patient reports  worsening shortness of breath over the last several months. She has been less mobile d/t arthritis symptom and weight gain. On exam she has coarse rales at her lung bases. Needs spirometry with DLCO and HRCT to assess for progression of RA-ILD. Sending in prednisone taper (20mg  x 5 days, 10mg  x 5 days, 5mg  x 5 days). She will establish with Dr. Vaughan Burke in August as new patient.   Rheumatoid arthritis involving both shoulders with positive rheumatoid factor (HCC) - Maintained on Plaquenil/Xeljanz  - Following with Dr. Trudie Burke   COPD (chronic obstructive pulmonary disease) ([place]) - Continue Stiolto respimat 2 puffs once daily - She declined pulmonary rehab, no feasible at this time   40 mins spent on case, >50% face to face with patient  Martyn Ehrich, NP 05/24/2021

## 2021-05-24 NOTE — Assessment & Plan Note (Signed)
-   Maintained on Plaquenil/Xeljanz  - Following with Dr. Trudie Reed

## 2021-05-28 DIAGNOSIS — Z20822 Contact with and (suspected) exposure to covid-19: Secondary | ICD-10-CM | POA: Diagnosis not present

## 2021-06-02 ENCOUNTER — Inpatient Hospital Stay: Admission: RE | Admit: 2021-06-02 | Payer: Medicare Other | Source: Ambulatory Visit

## 2021-06-04 ENCOUNTER — Telehealth: Payer: Self-pay | Admitting: Primary Care

## 2021-06-04 NOTE — Telephone Encounter (Signed)
Called pt to let her know she can call GI and reschedule her CT.  She states she wants it scheduled at [facility].  I told her I would get scheduled and call her back.

## 2021-06-04 NOTE — Telephone Encounter (Signed)
Pt is wanting to reschedule her CT scan stated that she cancelled the appt by mistake. Pls regard; [phone]

## 2021-06-04 NOTE — Telephone Encounter (Signed)
CT scheduled at [facility] on 8/5 at 11:45, arrive 11:15.  Gave appt info to pt & order faxed to [facility].

## 2021-06-13 ENCOUNTER — Encounter: Payer: Self-pay | Admitting: Primary Care

## 2021-06-15 DIAGNOSIS — I2510 Atherosclerotic heart disease of native coronary artery without angina pectoris: Secondary | ICD-10-CM | POA: Diagnosis not present

## 2021-06-15 DIAGNOSIS — I700 Atherosclerosis of aorta: Secondary | ICD-10-CM | POA: Diagnosis not present

## 2021-06-15 DIAGNOSIS — J849 Interstitial pulmonary disease, unspecified: Secondary | ICD-10-CM | POA: Diagnosis not present

## 2021-06-20 ENCOUNTER — Ambulatory Visit (INDEPENDENT_AMBULATORY_CARE_PROVIDER_SITE_OTHER): Payer: Medicare Other | Admitting: Nurse Practitioner

## 2021-06-20 ENCOUNTER — Telehealth: Payer: Self-pay

## 2021-06-20 ENCOUNTER — Encounter: Payer: Self-pay | Admitting: Nurse Practitioner

## 2021-06-20 DIAGNOSIS — J849 Interstitial pulmonary disease, unspecified: Secondary | ICD-10-CM

## 2021-06-20 DIAGNOSIS — D849 Immunodeficiency, unspecified: Secondary | ICD-10-CM

## 2021-06-20 DIAGNOSIS — U071 COVID-19: Secondary | ICD-10-CM

## 2021-06-20 NOTE — Telephone Encounter (Signed)
Pt called Monday 8/8 and made CMA aware she and husband tested positive for COVID at home. Advised pt we should do virtual visit or be seen in office so pt and husband could receive antivirals. Pt at that time refused visit due to not feeling bad.    Pt returned call today requesting the antivirals. States her symptoms started Thursday and husband's started Saturday. Both have congestion and cough, husband has fever. Pt states husband is worse than she is but both are fairing okay. Pt stated they do not have video capability and husband typically drives, she does not so in person visit could not happen. Spoke with Dr. Tobie Poet who advises both should have audio visits with Larene Beach NP this afternoon. Pt VU. Appointments scheduled.   Royce Macadamia, [place] 06/20/21 12:12 PM

## 2021-06-20 NOTE — Progress Notes (Signed)
Virtual Visit via Telephone Note   This visit type was conducted due to national recommendations for restrictions regarding the COVID-19 Pandemic (e.g. social distancing) in an effort to limit this patient's exposure and mitigate transmission in our community.  Due to her co-morbid illnesses, this patient is at least at moderate risk for complications without adequate follow up.  This format is felt to be most appropriate for this patient at this time.  The patient did not have access to video technology/had technical difficulties with video requiring transitioning to audio format only (telephone).  All issues noted in this document were discussed and addressed.  No physical exam could be performed with this format.  Patient verbally consented to a telehealth visit.   Date:  06/20/2021   ID:  Sherry Burke, DOB [birth year], MRN [id number]  Patient Location: Home Provider Location: Office/Clinic  PCP:  [name], MD   Evaluation Performed:  Established patient, acute telemedicine visit  Chief Complaint:  COVID-19   History of Present Illness:    Sherry Burke is a 80 y.o. female with fever, sinus congestion, and cough. Onset of symptoms was 6-days-ago. Home COVID-19 test positive on 06/18/21. Treatment has included Nyquil and Tylenol. She has declined Paxlovid antiviral medication. She denies chest pain or dyspnea.Pt has history of immunocompromised due to medication for RA. She has COPD and OSA. She has received two COVID-19 vaccines and two booster injections.   The patient does have symptoms concerning for COVID-19 infection (fever, chills, cough, or new shortness of breath).    Past Medical History:  Diagnosis Date   Acute renal insufficiency    Asthma    Chronic bronchitis    COPD (chronic obstructive pulmonary disease) (HCC)    Depression    History of migraine headaches    Hyperlipemia    OSA (obstructive sleep apnea)    pt states she doesn't have it   Osteoarthritis     Pneumonia    Rheumatoid arthritis(714.0)    RLS (restless legs syndrome)    Vitamin D deficiency disease     Past Surgical History:  Procedure Laterality Date   KNEE SURGERY Bilateral    when younger   TOTAL ABDOMINAL HYSTERECTOMY  06/1988    Family History  Problem Relation Age of Onset   Lung cancer Father    Stroke Mother    Hypertension Mother    Diabetes Mother    Rheum arthritis Sister    Rheum arthritis Sister     Social History   Socioeconomic History   Marital status: Married    Spouse name: Durene Fruits   Number of children: 3   Years of education: Not on file   Highest education level: Not on file  Occupational History   Occupation: retired    Comment: homemaker  Tobacco Use   Smoking status: Passive Smoke Exposure - Never Smoker   Smokeless tobacco: Never  Scientific laboratory technician Use: Never used  Substance and Sexual Activity   Alcohol use: No   Drug use: No   Sexual activity: Not Currently    Birth control/protection: None    Comment: Married  Other Topics Concern   Not on file  Social History Narrative   Lives with her husband, who is very helpful and supportive.   Social Determinants of Health   Financial Resource Strain: Not on file  Food Insecurity: No Food Insecurity   Worried About [place] in the Last Year: Never true   Ran Out  of Food in the Last Year: Never true  Transportation Needs: Not on file  Physical Activity: Not on file  Stress: Not on file  Social Connections: Not on file  Intimate Partner Violence: Not on file    Outpatient Medications Prior to Visit  Medication Sig Dispense Refill   albuterol (VENTOLIN HFA) 108 (90 Base) MCG/ACT inhaler Inhale 2 puffs into the lungs every 6 (six) hours as needed for wheezing or shortness of breath. 18 g 11   atorvastatin (LIPITOR) 40 MG tablet Take 1 tablet by mouth once daily 90 tablet 0   cloNIDine (CATAPRES) 0.1 MG tablet Take 1 tablet by mouth twice daily 180 tablet 0    cyanocobalamin 2000 MCG tablet Take 2,000 mcg by mouth daily.     diphenhydramine-acetaminophen (TYLENOL PM) 25-500 MG TABS tablet Take 1 tablet by mouth at bedtime as needed.     estradiol (ESTRACE) 0.1 MG/GM vaginal cream      ferrous sulfate 325 (65 FE) MG tablet Take 325 mg by mouth daily with breakfast.     fluticasone (FLONASE) 50 MCG/ACT nasal spray Place 2 sprays into both nostrils daily. (Patient taking differently: Place 2 sprays into both nostrils daily as needed for allergies.) 16 g 6   gabapentin (NEURONTIN) 300 MG capsule Take 1 capsule by mouth twice daily 180 capsule 0   HYDROcodone-acetaminophen (NORCO/VICODIN) 5-325 MG tablet Take 1 tablet by mouth every 6 (six) hours as needed for moderate pain. 120 tablet 0   hydroxychloroquine (PLAQUENIL) 200 MG tablet Take 1 tablet (200 mg total) by mouth 2 (two) times daily. 60 tablet 2   lisinopril (ZESTRIL) 10 MG tablet Take 1 tablet by mouth once daily 90 tablet 0   nitrofurantoin, macrocrystal-monohydrate, (MACROBID) 100 MG capsule Take 1 capsule (100 mg total) by mouth 2 (two) times daily. 60 capsule 2   omeprazole (PRILOSEC) 40 MG capsule Take 1 capsule by mouth twice daily 180 capsule 0   ONETOUCH ULTRA test strip USE TO CHECK BLOOD SUGAR ONCE DAILY IN THE MORNING     PARoxetine (PAXIL-CR) 25 MG 24 hr tablet Take 1 tablet by mouth once daily 90 tablet 0   polyethylene glycol (MIRALAX / GLYCOLAX) 17 g packet Take 17 g by mouth daily. Use daily as needed for constipation     rOPINIRole (REQUIP) 1 MG tablet TAKE 1 TABLET EVERY MORNINGAND 2 TABLETS AT BEDTIME 270 tablet 3   Tiotropium Bromide-Olodaterol (STIOLTO RESPIMAT) 2.5-2.5 MCG/ACT AERS Inhale 2 puffs into the lungs daily. 4 g 11   Tofacitinib Citrate (XELJANZ) 5 MG TABS Take 5 mg by mouth daily.      Vitamin D, Ergocalciferol, (DRISDOL) 1.25 MG (50000 UNIT) CAPS capsule Take 1 capsule by mouth once a week 12 capsule 3   No facility-administered medications prior to visit.     Allergies:   Sulfa antibiotics, Actemra [tocilizumab], Amlodipine, Celebrex [celecoxib], Methotrexate derivatives, Niaspan [niacin], Remicade [infliximab], and Welchol [colesevelam]   Social History   Tobacco Use   Smoking status: Passive Smoke Exposure - Never Smoker   Smokeless tobacco: Never  Vaping Use   Vaping Use: Never used  Substance Use Topics   Alcohol use: No   Drug use: No     Review of Systems  Constitutional:  Positive for chills, fever and malaise/fatigue.  HENT:  Positive for congestion and sinus pain. Negative for ear pain and sore throat.   Eyes: Negative.   Respiratory:  Positive for cough and shortness of breath.   Cardiovascular: Negative.  Negative for chest pain.  Gastrointestinal: Negative.   Genitourinary: Negative.   Musculoskeletal:  Positive for myalgias.  Skin:  Negative for rash.  Neurological:  Positive for weakness and headaches.  Endo/Heme/Allergies:  Positive for environmental allergies.       Immunocompromised due to autoimmune medications  Psychiatric/Behavioral: Negative.      Labs/Other Tests and Data Reviewed:    Recent Labs: 03/30/2021: ALT 19; BUN 26; Creatinine, Ser 1.22; Hemoglobin 10.9; Platelets 186; Potassium 4.4; Sodium 142; TSH 2.080   Recent Lipid Panel Lab Results  Component Value Date/Time   CHOL 169 03/30/2021 11:29 AM   TRIG 86 03/30/2021 11:29 AM   HDL 77 03/30/2021 11:29 AM   CHOLHDL 2.2 03/30/2021 11:29 AM   LDLCALC 76 03/30/2021 11:29 AM    Wt Readings from Last 3 Encounters:  05/24/21 200 lb 3.2 oz (90.8 kg)  03/30/21 190 lb (86.2 kg)  03/20/21 198 lb (89.8 kg)     Objective:    Vital Signs:  There were no vitals taken for this visit.   Physical Exam No physical exam due to telemedicine visit  ASSESSMENT & PLAN:     1. COVID-19 -rest and push fluids -seek emergency medical care for worsening symptoms -continue symptom management with OTC medications  2. ILD (interstitial lung disease)  ([place]) -use inhalers as prescribed -seek emergency medical care for severe dyspnea or any other serious concerns  3. Immunosuppressed status ([place])  -seek emergency medical care for worsening symptoms  COVID-19 Education: The signs and symptoms of COVID-19 were discussed with the patient and how to seek care for testing (follow up with PCP or arrange E-visit). The importance of social distancing was discussed today.   I spent 10 minutes dedicated to the care of this patient on the date of this encounter to include face-to-face time with the patient, as well as: EMR.   Follow Up:  In Person prn  Signed,  Rip Harbour, NP  06/20/2021 3:58 PM    [place]

## 2021-06-25 ENCOUNTER — Other Ambulatory Visit: Payer: Self-pay | Admitting: Family Medicine

## 2021-06-25 DIAGNOSIS — K219 Gastro-esophageal reflux disease without esophagitis: Secondary | ICD-10-CM

## 2021-06-29 ENCOUNTER — Telehealth: Payer: Self-pay

## 2021-06-29 NOTE — Chronic Care Management (AMB) (Signed)
Chronic Care Management Pharmacy Assistant   Name: Sherry Burke  MRN: [id number] DOB: 07-Nov-1941  Reason for Encounter: COPD Disease State Call   Recent office visits:  06/20/21- Jerrell Belfast, NP (Virtual Visit)- seen for Covid-19, no medication changes, follow up prn  Recent consult visits:  05/24/21- Geraldo Pitter, NP (Pulmonology)- seen for follow up of COPD, sent prednisone taper, CT ordered, PFT ordered, follow up as scheduled  Hospital visits:  None in previous 6 months  Medications: Outpatient Encounter Medications as of 06/29/2021  Medication Sig   albuterol (VENTOLIN HFA) 108 (90 Base) MCG/ACT inhaler Inhale 2 puffs into the lungs every 6 (six) hours as needed for wheezing or shortness of breath.   atorvastatin (LIPITOR) 40 MG tablet Take 1 tablet by mouth once daily   cloNIDine (CATAPRES) 0.1 MG tablet Take 1 tablet by mouth twice daily   cyanocobalamin 2000 MCG tablet Take 2,000 mcg by mouth daily.   diphenhydramine-acetaminophen (TYLENOL PM) 25-500 MG TABS tablet Take 1 tablet by mouth at bedtime as needed.   estradiol (ESTRACE) 0.1 MG/GM vaginal cream    ferrous sulfate 325 (65 FE) MG tablet Take 325 mg by mouth daily with breakfast.   fluticasone (FLONASE) 50 MCG/ACT nasal spray Place 2 sprays into both nostrils daily. (Patient taking differently: Place 2 sprays into both nostrils daily as needed for allergies.)   gabapentin (NEURONTIN) 300 MG capsule Take 1 capsule by mouth twice daily   HYDROcodone-acetaminophen (NORCO/VICODIN) 5-325 MG tablet Take 1 tablet by mouth every 6 (six) hours as needed for moderate pain.   hydroxychloroquine (PLAQUENIL) 200 MG tablet Take 1 tablet (200 mg total) by mouth 2 (two) times daily.   lisinopril (ZESTRIL) 10 MG tablet Take 1 tablet by mouth once daily   nitrofurantoin, macrocrystal-monohydrate, (MACROBID) 100 MG capsule Take 1 capsule (100 mg total) by mouth 2 (two) times daily.   omeprazole (PRILOSEC) 40 MG capsule Take 1  capsule (40 mg total) by mouth 2 (two) times daily.   ONETOUCH ULTRA test strip USE TO CHECK BLOOD SUGAR ONCE DAILY IN THE MORNING   PARoxetine (PAXIL-CR) 25 MG 24 hr tablet Take 1 tablet by mouth once daily   polyethylene glycol (MIRALAX / GLYCOLAX) 17 g packet Take 17 g by mouth daily. Use daily as needed for constipation   rOPINIRole (REQUIP) 1 MG tablet TAKE 1 TABLET EVERY MORNINGAND 2 TABLETS AT BEDTIME   Tiotropium Bromide-Olodaterol (STIOLTO RESPIMAT) 2.5-2.5 MCG/ACT AERS Inhale 2 puffs into the lungs daily.   Tofacitinib Citrate (XELJANZ) 5 MG TABS Take 5 mg by mouth daily.    Vitamin D, Ergocalciferol, (DRISDOL) 1.25 MG (50000 UNIT) CAPS capsule Take 1 capsule by mouth once a week   No facility-administered encounter medications on file as of 06/29/2021.   I spoke with Sherry Burke and she has just got over Covid after the past 3 weeks. She had to reschedule her Pulmonology visit due to this and will be seeing them later this year. She consistently is SOB, but has been managing her symptoms with her inhalers, nebulizer and rest. She confirms adherence to all medications but was not sure if she should still be taking lisinopril. Other than this she had no other concerns or complaints for her health.   Current COPD regimen:  Albuterol inhaler, 2 puffs q6h prn wheezing or SOB.[place]anny respimat, 2 puffs qd   Any recent hospitalizations or ED visits since last visit with CPP? No  reports the following COPD symptoms, including Increased shortness  of breath  and Wheezing   What recent interventions/DTPs have been made by any provider to improve breathing since last visit: No recent interventions   Have you had exacerbation/flare-up since last visit?  Patient stated she has exacerbations daily with little activity including walking, doing laundry, and making the bed  What do you do when you are short of breath?  Rest and in worse cases albuterol inh   Current tobacco use?  No  Respiratory Devices/Equipment Do you have a nebulizer? Yes Albuterol nebulizer solution 0.042% BID  Do you use a Peak Flow Meter? Yes Up to 15  Do you use a maintenance inhaler? Yes How often do you forget to use your daily inhaler? Once in a while Do you use a rescue inhaler? Yes How often do you use your rescue inhaler?  Patient stated she rarely uses her rescue inhaler and instead tries to rest to alleviate her SOB  Do you use a spacer with your inhaler? No  Adherence Review: Does the patient have >5 day gap between last estimated fill date for maintenance inhaler medications? Albuterol inhaler- 50 DS last filled 09/07/20 Stiolto respimat- 30 DS last filled 06/18/21   Care Gaps: Last annual wellness visit? [facility] nurse contacted  Star Rating Drugs: Atorvastatin 40 mg- 90 DS last filled 04/04/21 Lisinopril 10 mg - 90 DS last filled 07/18/20 -  Patient stated she does not believe she is still taking this and would like clarification if she should still be  Cottie Banda, CMA

## 2021-07-05 ENCOUNTER — Other Ambulatory Visit: Payer: Self-pay

## 2021-07-05 MED ORDER — LISINOPRIL 10 MG PO TABS
10.0000 mg | ORAL_TABLET | Freq: Every day | ORAL | 0 refills | Status: DC
Start: 2021-07-05 — End: 2021-10-05

## 2021-07-05 NOTE — Telephone Encounter (Cosign Needed)
I called and informed [name] that she should still be taking lisinopril. Patient stated she will be needing a new prescription sent to [facility].

## 2021-07-10 ENCOUNTER — Ambulatory Visit: Payer: Medicare Other | Admitting: Pulmonary Disease

## 2021-07-13 ENCOUNTER — Telehealth: Payer: Self-pay

## 2021-07-13 NOTE — Chronic Care Management (AMB) (Signed)
Chronic Care Management Pharmacy Assistant   Name: Sherry Burke  MRN: [id number] DOB: [birth year]  Reason for Encounter: Hypertension Disease State Call  Recent office visits:  No visits noted  Recent consult visits:  No visits noted  Hospital visits:  None in previous 6 months  Medications: Outpatient Encounter Medications as of 07/13/2021  Medication Sig   albuterol (VENTOLIN HFA) 108 (90 Base) MCG/ACT inhaler Inhale 2 puffs into the lungs every 6 (six) hours as needed for wheezing or shortness of breath.   atorvastatin (LIPITOR) 40 MG tablet Take 1 tablet by mouth once daily   cloNIDine (CATAPRES) 0.1 MG tablet Take 1 tablet by mouth twice daily   cyanocobalamin 2000 MCG tablet Take 2,000 mcg by mouth daily.   diphenhydramine-acetaminophen (TYLENOL PM) 25-500 MG TABS tablet Take 1 tablet by mouth at bedtime as needed.   estradiol (ESTRACE) 0.1 MG/GM vaginal cream    ferrous sulfate 325 (65 FE) MG tablet Take 325 mg by mouth daily with breakfast.   fluticasone (FLONASE) 50 MCG/ACT nasal spray Place 2 sprays into both nostrils daily. (Patient taking differently: Place 2 sprays into both nostrils daily as needed for allergies.)   gabapentin (NEURONTIN) 300 MG capsule Take 1 capsule by mouth twice daily   HYDROcodone-acetaminophen (NORCO/VICODIN) 5-325 MG tablet Take 1 tablet by mouth every 6 (six) hours as needed for moderate pain.   hydroxychloroquine (PLAQUENIL) 200 MG tablet Take 1 tablet (200 mg total) by mouth 2 (two) times daily.   lisinopril (ZESTRIL) 10 MG tablet Take 1 tablet (10 mg total) by mouth daily.   nitrofurantoin, macrocrystal-monohydrate, (MACROBID) 100 MG capsule Take 1 capsule (100 mg total) by mouth 2 (two) times daily.   omeprazole (PRILOSEC) 40 MG capsule Take 1 capsule (40 mg total) by mouth 2 (two) times daily.   ONETOUCH ULTRA test strip USE TO CHECK BLOOD SUGAR ONCE DAILY IN THE MORNING   PARoxetine (PAXIL-CR) 25 MG 24 hr tablet Take 1 tablet by mouth  once daily   polyethylene glycol (MIRALAX / GLYCOLAX) 17 g packet Take 17 g by mouth daily. Use daily as needed for constipation   rOPINIRole (REQUIP) 1 MG tablet TAKE 1 TABLET EVERY MORNINGAND 2 TABLETS AT BEDTIME   Tiotropium Bromide-Olodaterol (STIOLTO RESPIMAT) 2.5-2.5 MCG/ACT AERS Inhale 2 puffs into the lungs daily.   Tofacitinib Citrate (XELJANZ) 5 MG TABS Take 5 mg by mouth daily.    Vitamin D, Ergocalciferol, (DRISDOL) 1.25 MG (50000 UNIT) CAPS capsule Take 1 capsule by mouth once a week   No facility-administered encounter medications on file as of 07/13/2021.     Recent Office Vitals: BP Readings from Last 3 Encounters:  05/24/21 118/70  03/30/21 (!) 144/84  03/20/21 138/64   Pulse Readings from Last 3 Encounters:  05/24/21 81  03/30/21 88  03/20/21 74    Wt Readings from Last 3 Encounters:  05/24/21 200 lb 3.2 oz (90.8 kg)  03/30/21 190 lb (86.2 kg)  03/20/21 198 lb (89.8 kg)     Kidney Function Lab Results  Component Value Date/Time   CREATININE 1.22 (H) 03/30/2021 11:29 AM   CREATININE 1.15 (H) 02/05/2021 10:21 AM   GFRNONAA 40 (L) 01/01/2021 10:16 AM   GFRAA 46 (L) 01/01/2021 10:16 AM    BMP Latest Ref Rng & Units 03/30/2021 02/05/2021 01/01/2021  Glucose 65 - 99 mg/dL 90 109(H) 97  BUN 8 - 27 mg/dL 26 22 25   Creatinine 0.57 - 1.00 mg/dL 1.22(H) 1.15(H) 1.28(H)  BUN/Creat Ratio 12 -  28 21 19 20   Sodium 134 - 144 mmol/L 142 142 139  Potassium 3.5 - 5.2 mmol/L 4.4 4.5 4.9  Chloride 96 - 106 mmol/L 104 106 102  CO2 20 - 29 mmol/L 21 22 19(L)  Calcium 8.7 - 10.3 mg/dL 9.3 8.7 9.5   I spoke with Sherry Burke this morning and she is having some bad back pain which she believes is from straining it. Her back pain is on the right side and she has been taking hydrocodone- acetaminophen with little to no relief. She stated that she will try icy hot and benjay for additional relief. Otherwise she has no complaints. Her and her husband who have been married for 60 years  are doing well and take care of each other when their children are not around.  Current antihypertensive regimen:  lisinopril 10 mg daily  Clonidine 0.1 mg bid   Patient verbally confirms she is taking the above medications as directed. Yes  How often are you checking your Blood Pressure? Patient stated she checks her BP once weekly  she checks her blood pressure in the morning before taking her medication.  Current home BP readings: 128/70  Wrist or arm cuff: arm  Caffeine intake: 5 cups daily of lesser strength  Salt intake:Little to none  OTC medications including pseudoephedrine or NSAIDs? Tylenol pm qhs   Any readings above 180/120? No  What recent interventions/DTPs have been made by any provider to improve Blood Pressure control since last CPP Visit:  No recent interventions   Any recent hospitalizations or ED visits since last visit with CPP? No   What diet changes have been made to improve Blood Pressure Control?  Patient stated she has not made changes to her diet. She eats biscuits, sausage,sandwiches, vegetables, chicken, pork chops, hamburgers, and pot pies.   What exercise is being done to improve your Blood Pressure Control?  Patient stated she is able to do house work and cook at home to remain active. Otherwise she is unable to remain active.   Adherence Review: Is the patient currently on ACE/ARB medication? Yes Does the patient have >5 day gap between last estimated fill dates?  Clonidine 0.1 mg- 90 DS last filled 05/23/21  Care Gaps: Last annual wellness visit? None noted, message to nurse previously sent   Star Rating Drugs:  Medication:  Last Fill: [facility] Atorvastatin 40 mg  06/30/21 90 DS Lisinopril 10 mg  07/10/21 90 DS  Wilford Sports CPA, CMA

## 2021-07-17 ENCOUNTER — Other Ambulatory Visit: Payer: Self-pay

## 2021-07-17 ENCOUNTER — Telehealth: Payer: Medicare Other

## 2021-07-17 MED ORDER — HYDROCODONE-ACETAMINOPHEN 5-325 MG PO TABS: 1.0000 | ORAL_TABLET | Freq: Four times a day (QID) | ORAL | 0 refills | Status: DC | PRN

## 2021-08-08 ENCOUNTER — Ambulatory Visit: Payer: Medicare Other

## 2021-08-22 ENCOUNTER — Ambulatory Visit (INDEPENDENT_AMBULATORY_CARE_PROVIDER_SITE_OTHER): Payer: Medicare Other

## 2021-08-22 ENCOUNTER — Encounter: Payer: Self-pay | Admitting: Family Medicine

## 2021-08-22 DIAGNOSIS — Z23 Encounter for immunization: Secondary | ICD-10-CM | POA: Diagnosis not present

## 2021-08-22 NOTE — Progress Notes (Signed)
   Covid-19 Vaccination Clinic  Name:  Aylanie Cubillos    MRN: [id number] DOB: 1941/03/22  08/22/2021  Ms. Clayton was observed post Covid-19 immunization for 15 minutes without incident. She was provided with Vaccine Information Sheet and instruction to access the V-Safe system.   Ms. Arseneau was instructed to call 911 with any severe reactions post vaccine: Difficulty breathing  Swelling of face and throat  A fast heartbeat  A bad rash all over body  Dizziness and weakness

## 2021-08-23 ENCOUNTER — Telehealth: Payer: Self-pay | Admitting: Pulmonary Disease

## 2021-08-23 NOTE — Telephone Encounter (Signed)
Called and spoke with Sherry Burke, Patient's son.  Sherry Burke stated patient is having DOE.  Sherry Burke stated Patient is sob with simple task.   Patient scheduled 09/28/21, with Eustaquio Maize, NP, at 0930. Nothing further at this time.

## 2021-08-23 NOTE — Telephone Encounter (Signed)
Per pt son Anthony(dpr needs to be updated) pt is having a hard time breathing. Unable to do a simple task like getting up to make a cup of coffee without getting SOB, having to use inhaler and sit down.Very SOB on exertion, but her o2 sat remains good. Please advise [phone]

## 2021-08-27 ENCOUNTER — Telehealth: Payer: Medicare Other

## 2021-08-27 ENCOUNTER — Other Ambulatory Visit: Payer: Self-pay | Admitting: Physician Assistant

## 2021-08-28 ENCOUNTER — Ambulatory Visit (INDEPENDENT_AMBULATORY_CARE_PROVIDER_SITE_OTHER): Payer: Medicare Other | Admitting: Primary Care

## 2021-08-28 ENCOUNTER — Other Ambulatory Visit: Payer: Self-pay

## 2021-08-28 ENCOUNTER — Encounter: Payer: Self-pay | Admitting: Primary Care

## 2021-08-28 DIAGNOSIS — J849 Interstitial pulmonary disease, unspecified: Secondary | ICD-10-CM | POA: Diagnosis not present

## 2021-08-28 DIAGNOSIS — M05712 Rheumatoid arthritis with rheumatoid factor of left shoulder without organ or systems involvement: Secondary | ICD-10-CM

## 2021-08-28 DIAGNOSIS — M05711 Rheumatoid arthritis with rheumatoid factor of right shoulder without organ or systems involvement: Secondary | ICD-10-CM

## 2021-08-28 DIAGNOSIS — J410 Simple chronic bronchitis: Secondary | ICD-10-CM | POA: Diagnosis not present

## 2021-08-28 MED ORDER — BREZTRI AEROSPHERE 160-9-4.8 MCG/ACT IN AERO: 2.0000 | INHALATION_SPRAY | Freq: Two times a day (BID) | RESPIRATORY_TRACT | 0 refills | Status: DC

## 2021-08-28 MED ORDER — PREDNISONE 10 MG PO TABS: ORAL_TABLET | ORAL | 0 refills | Status: DC

## 2021-08-28 NOTE — Assessment & Plan Note (Addendum)
-   Hx RA-ILD. Breathing has progressively worsened over the last year. She experiences significant shortness of breath with minimal exertion. She has coarse rales on exam. Patient had HRCT at [facility], results are not available in chart so we will be requesting them to review and assess for any progression. She is scheduled for PFTs in November, this was pushed back d/t having covid on early August 2022. She did not desaturate on ambulatory walk test today, O2 maintained >93% RA. We will give her a prednisone taper today and have her stay on 10mg  daily until follow-up. Needs to establish with Dr. Vaughan Browner first available

## 2021-08-28 NOTE — Assessment & Plan Note (Addendum)
-   Increased dyspnea likely related to ILD, however, she does have moderate amount of wheezing on exam. Recommend changing Stiolto to [employer] two puffs morning and evening. Use albuterol 2 puffs every 6 hours as needed for breakthrough shortness of breath/wheezing.

## 2021-08-28 NOTE — Progress Notes (Signed)
@Patient  ID: Sherry Burke, female    DOB: [birth year], 80 y.o.   MRN: [id number]  Chief Complaint  Patient presents with   Follow-up    Pt. Has been having a lot of trouble breathing.     Referring provider: [name], MD  HPI: 80 year old female, never smoked (passive exposure). PMH significant for COPD, RA-ILD, OSA, hypertension, GERD, rheumatoid arthritis, hyperlipidemia. Former patient of Dr. Carlis Abbott, last seen on 08/01/20 by pulmonary NP.   Previous [facility] pulmonary encounters: OV 04/12/20: Sherry Burke is a 80 year old woman who presents for follow-up.  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  This was stopped several years ago due to concern for methotrexate induced lung toxicity.  Unfortunately her joint symptoms have been difficult to control with other medication regimens due to side effects or lack of efficacy.  She is currently on reduced dose Xeljanz as she had intolerable nausea on full dose.  Her symptoms are improved with taking it once daily.  At her last visit with Derl Barrow, NP on 03/10/2020 she had not noticed a significant benefit from [facility].  She was started on Spiriva once daily.  Since then she has had issues with thrush, which have improved since stopping and taking a break.  She does not have a sore throat.  She is sure to rinse her mouth after every use and is currently back to taking it once daily.  Her shortness of breath is stable; she has not noticed a significant benefit from inhalers.  05/17/20 Sherry Burke is a 80 y/o woman who is following up since starting Spiriva once daily.  She has noticed an improvement in shortness of breath and gets short of breath less frequently.  Overall she still has shortness of breath that she associates with age and chronic lung disease, but feels that it is improved.  She initially had mild thrush, which is improved with rinsing her mouth more aggressively.  She has not been using albuterol  frequently, but is requesting an albuterol refill.  She has an appointment with Dr. Lenna Gilford from rheumatology later this month.  She is not currently on methotrexate, but it may be required as a backup if her current medication is not working.  She has some arthralgias now, but is resistant to taking hydrocodone at this time.   08/01/2020  - Visit 80 year old female former smoker upon our office for COPD and interstitial lung disease she was last seen by Dr. Carlis Abbott in July/2021 this was a virtual visit plan of care from that visit was as follows to continue Spiriva once daily.  Could consider escalating to Stiolto if symptoms worsen.  Patient was encouraged to restart methotrexate.  Plan will be to obtain pulmonary function testing 3 months after starting for surveillance.  Plan was to repeat a high-resolution CT chest in 1 year.  She was recommended to follow-up in 3 months.   Patient reports that she never restarted methotrexate she is managed by Dr. Trudie Reed with rheumatology for her rheumatoid arthritis.  She is currently managed on [place].  She is only taking 1 tablet daily due to GI upset.  They feel that this is adequately controlling her rheumatoid arthritis at this time.  Patient simply wanted to know whether or not she could restart methotrexate if needed.  She is a follow-up with their office in 1 month.   Patient continues to lead a fairly sedentary lifestyle.  She also struggles with back pain.  This limits her overall physical mobility.  She tries to do chair exercises.   She plans on obtaining the COVID-19 booster when recommended to her.  She plans on obtaining the seasonal flu vaccine when recommended to her.   She does not feel the Spiriva was very helpful in management of her shortness of breath.  She would like to discuss this today.  05/24/2021 Patient presents today for overdue follow-up. During her last visit in September 2021 she was given trial of Stiolto Respimat in place of  Spiriva. She was also ordered for repeat spirometry with DLCO and asked to follow-up in 3 months with either Dr. Vaughan Browner or [place]. She did not complete PFTs. She is maintained on Stiolto daily and has been using her albuterol inhaler. She has been more short of breath over the last several months. She has been less mobile and has gained some weight. She has a productive cough that is not bothersome to her. Congestion is more upper airway/throat. She has rheumatoid arthritis, she is currently dealing with a lot of back and knee pain. She follows with Dr. Trudie Reed with rheumatology for her history of RA. She is currently on Plaquenil/xeljanz. Recently increased xeljanz and that has helped with her arthritis some.   08/28/2021- interim hx  Patient presents today for acute office visit with complaints for increase shortness of breath. She was last seen in July for similar symptoms. Her breathing has been worse over the last several months. She is alright sitting or when not exerting herself. She gets out of breath doing any strenuous activity, has trouble getting dress and making the bed. She is compliant with Stiolto Respimat. Prednisone did help when she took it. She had HRCT at [facility], results are not available in chart so we will need to request they be fazed to [place]. PFTs were scheduled for August and she had an apt with Dr. Vaughan Browner which was canceled because she got covid. Her arthritis symptoms appear to be stable. She is maintained on Plaquenil and Xeljanz.    Pulmonary function testing: 03/10/2020 - FVC 1.60 (71%), FEV1 1.07 (65%), ratio 67, TLC 94%, DLCOcor 11.82 (71%). No BD response   04/10/2011: FVC 2.64 (92%)--> 2.63 (92%, 0% change) FEV1 1.55 L (76%) --> 1.57 L (77%, 1% change) Ratio 59 RV 1.8 (94%)  TLC 4.44 (92%) via nitrogen washout DLCO 20.3 (83%)  flow volume loop consistent with obstruction -Mild obstruction, no significant restriction or diffusion  impairment   Imaging: HRCT 09/08/19- Pulmonary parenchymal pattern of predominantly peripheral ground-glass, reticular densities and bronchiolectasis is unchanged from 04/29/2015 and may represent nonspecific interstitial pneumonitis. Given micro nodularity in the right upper lobe and along the fissures, the possibility of sarcoid is not excluded. Findings are indeterminate for UIP        Allergies  Allergen Reactions   Sulfa Antibiotics Nausea And Vomiting   Actemra [Tocilizumab]    Amlodipine    Celebrex [Celecoxib]    Methotrexate Derivatives Other (See Comments)    sweating   Niaspan [Niacin]     Severe flushing   Remicade [Infliximab] Nausea And Vomiting and Other (See Comments)    Affects nervous system   Welchol [Colesevelam] Other (See Comments)    Abdominal pain and headache    Immunization History  Administered Date(s) Administered   Fluad Quad(high Dose 65+) 08/22/2021   Influenza Whole 08/12/2011   Influenza, High Dose Seasonal PF 08/12/2019, 08/01/2020   Moderna Covid-19 Vaccine Bivalent Booster 67yrs & up 08/22/2021  Moderna SARS-COV2 Booster Vaccination 09/12/2020, 03/20/2021   Moderna Sars-Covid-2 Vaccination 11/26/2019, 12/24/2019   Pneumococcal Conjugate-13 09/08/2014   Pneumococcal Polysaccharide-23 10/05/2014   Tdap 10/01/2011    Past Medical History:  Diagnosis Date   Acute renal insufficiency    Asthma    Chronic bronchitis    COPD (chronic obstructive pulmonary disease) (HCC)    Depression    History of migraine headaches    Hyperlipemia    OSA (obstructive sleep apnea)    pt states she doesn't have it   Osteoarthritis    Pneumonia    Rheumatoid arthritis(714.0)    RLS (restless legs syndrome)    Vitamin D deficiency disease     Tobacco History: Social History   Tobacco Use  Smoking Status Never   Passive exposure: Yes  Smokeless Tobacco Never   Counseling given: Not Answered   Outpatient Medications Prior to Visit   Medication Sig Dispense Refill   albuterol (VENTOLIN HFA) 108 (90 Base) MCG/ACT inhaler Inhale 2 puffs into the lungs every 6 (six) hours as needed for wheezing or shortness of breath. 18 g 11   atorvastatin (LIPITOR) 40 MG tablet Take 1 tablet by mouth once daily 90 tablet 0   cloNIDine (CATAPRES) 0.1 MG tablet Take 1 tablet by mouth twice daily 180 tablet 0   cyanocobalamin 2000 MCG tablet Take 2,000 mcg by mouth daily.     diphenhydramine-acetaminophen (TYLENOL PM) 25-500 MG TABS tablet Take 1 tablet by mouth at bedtime as needed.     estradiol (ESTRACE) 0.1 MG/GM vaginal cream      ferrous sulfate 325 (65 FE) MG tablet Take 325 mg by mouth daily with breakfast.     fluticasone (FLONASE) 50 MCG/ACT nasal spray Place 2 sprays into both nostrils daily. (Patient taking differently: Place 2 sprays into both nostrils daily as needed for allergies.) 16 g 6   gabapentin (NEURONTIN) 300 MG capsule Take 1 capsule by mouth twice daily 180 capsule 0   HYDROcodone-acetaminophen (NORCO/VICODIN) 5-325 MG tablet Take 1 tablet by mouth every 6 (six) hours as needed for moderate pain. 120 tablet 0   hydroxychloroquine (PLAQUENIL) 200 MG tablet Take 1 tablet (200 mg total) by mouth 2 (two) times daily. 60 tablet 2   lisinopril (ZESTRIL) 10 MG tablet Take 1 tablet (10 mg total) by mouth daily. 90 tablet 0   nitrofurantoin, macrocrystal-monohydrate, (MACROBID) 100 MG capsule Take 1 capsule (100 mg total) by mouth 2 (two) times daily. 60 capsule 2   omeprazole (PRILOSEC) 40 MG capsule Take 1 capsule (40 mg total) by mouth 2 (two) times daily. 180 capsule 0   ONETOUCH ULTRA test strip USE TO CHECK BLOOD SUGAR ONCE DAILY IN THE MORNING     PARoxetine (PAXIL-CR) 25 MG 24 hr tablet Take 1 tablet by mouth once daily 90 tablet 0   polyethylene glycol (MIRALAX / GLYCOLAX) 17 g packet Take 17 g by mouth daily. Use daily as needed for constipation     rOPINIRole (REQUIP) 1 MG tablet TAKE 1 TABLET EVERY MORNINGAND 2 TABLETS  AT BEDTIME 270 tablet 3   Tiotropium Bromide-Olodaterol (STIOLTO RESPIMAT) 2.5-2.5 MCG/ACT AERS Inhale 2 puffs into the lungs daily. 4 g 11   Tofacitinib Citrate (XELJANZ) 5 MG TABS Take 5 mg by mouth 2 (two) times daily. Take twice a day.     Vitamin D, Ergocalciferol, (DRISDOL) 1.25 MG (50000 UNIT) CAPS capsule Take 1 capsule by mouth once a week 12 capsule 3   No facility-administered medications prior  to visit.    Review of Systems  Review of Systems  Constitutional:  Positive for fatigue.  HENT: Negative.    Respiratory:  Positive for shortness of breath. Negative for wheezing.   Cardiovascular: Negative.     Physical Exam  BP 118/72 (BP Location: Right Arm, Patient Position: Sitting, Cuff Size: Large)   Pulse 86   Temp 97.6 F (36.4 C) (Oral)   Ht 5' (1.524 m)   Wt 205 lb 3.2 oz (93.1 kg)   SpO2 95%   BMI 40.08 kg/m  Physical Exam Constitutional:      Appearance: Normal appearance.  HENT:     Head: Normocephalic and atraumatic.     Mouth/Throat:     Comments: Deferred d/t masking  Cardiovascular:     Rate and Rhythm: Normal rate and regular rhythm.  Pulmonary:     Breath sounds: Wheezing and rales present.     Comments: Coarse rales to lung bases Musculoskeletal:        General: Normal range of motion.     Comments: In transport WC; amb with walker/cane   Neurological:     General: No focal deficit present.     Mental Status: She is alert and oriented to person, place, and time. Mental status is at baseline.  Psychiatric:        Mood and Affect: Mood normal.        Behavior: Behavior normal.        Thought Content: Thought content normal.        Judgment: Judgment normal.     Lab Results:  CBC    Component Value Date/Time   WBC 7.3 03/30/2021 1129   WBC 7.6 08/23/2013 1829   RBC 3.99 03/30/2021 1129   RBC 4.50 08/23/2013 1829   HGB 10.9 (L) 03/30/2021 1129   HCT 33.2 (L) 03/30/2021 1129   PLT 186 03/30/2021 1129   MCV 83 03/30/2021 1129   MCH  27.3 03/30/2021 1129   MCH 28.7 08/23/2013 1829   MCHC 32.8 03/30/2021 1129   MCHC 32.7 08/23/2013 1829   RDW 16.0 (H) 03/30/2021 1129   LYMPHSABS 1.1 03/30/2021 1129   MONOABS 0.6 08/23/2013 1829   EOSABS 0.3 03/30/2021 1129   BASOSABS 0.1 03/30/2021 1129    BMET    Component Value Date/Time   NA 142 03/30/2021 1129   K 4.4 03/30/2021 1129   CL 104 03/30/2021 1129   CO2 21 03/30/2021 1129   GLUCOSE 90 03/30/2021 1129   GLUCOSE 90 08/23/2013 1829   BUN 26 03/30/2021 1129   CREATININE 1.22 (H) 03/30/2021 1129   CALCIUM 9.3 03/30/2021 1129   GFRNONAA 40 (L) 01/01/2021 1016   GFRAA 46 (L) 01/01/2021 1016    BNP No results found for: BNP  ProBNP No results found for: PROBNP  Imaging: No results found.   Assessment & Plan:   ILD (interstitial lung disease) ([place]) - Hx RA-ILD. Breathing has progressively worsened over the last year. She experiences significant shortness of breath with minimal exertion. She has coarse rales on exam. Patient had [facility] at [facility], results are not available in chart so we will be requesting them to review and assess for any progression. She is scheduled for PFTs in November, this was pushed back d/t having covid on early August 2022. She did not desaturate on ambulatory walk test today, O2 maintained >93% RA. We will give her a prednisone taper today and have her stay on 10mg  daily until follow-up. Needs to establish  with Dr. Vaughan Browner first available   Rheumatoid arthritis involving both shoulders with positive rheumatoid factor ([place]) - Maintained on Plaquenil/Xeljanz  - Following with Dr. Trudie Reed   COPD (chronic obstructive pulmonary disease) ([place]) - Increased dyspnea likely related to ILD, however, she does have moderate amount of wheezing on exam. Recommend changing Stiolto to [employer] two puffs morning and evening. Use albuterol 2 puffs every 6 hours as needed for breakthrough shortness of breath/wheezing.   40 mins spent on case;  >50% face to face with patient   Martyn Ehrich, NP 08/28/2021

## 2021-08-28 NOTE — Assessment & Plan Note (Signed)
-   Maintained on Plaquenil/Xeljanz  - Following with Dr. Trudie Reed

## 2021-08-28 NOTE — Addendum Note (Signed)
Addended by: Fran Lowes on: 08/28/2021 10:11 AM   Modules accepted: Orders

## 2021-08-28 NOTE — Patient Instructions (Addendum)
  Recommendations: - Take Prednisone 20mg  x 1 week; then stay on 10mg  daily until follow-up  - Stop Stiolto; Start [employer] two puffs morning and evening  - Use albuterol 2 puffs every 6 hours as needed for breakthrough shortness of breath/wheezing  - Breathing test is scheduled for end of November   Orders: - Please get HRCT results from [place] (Needs patient release form)  Follow-up: - Needs first available apt with Dr. Vaughan Browner in November (30 min slot-ILD); Please see if we can move up PFTs beginning of November. If not able to get on the same day this is alright

## 2021-09-14 ENCOUNTER — Other Ambulatory Visit: Payer: Self-pay | Admitting: Family Medicine

## 2021-09-14 DIAGNOSIS — F331 Major depressive disorder, recurrent, moderate: Secondary | ICD-10-CM

## 2021-09-23 ENCOUNTER — Other Ambulatory Visit: Payer: Self-pay | Admitting: Family Medicine

## 2021-09-23 DIAGNOSIS — K219 Gastro-esophageal reflux disease without esophagitis: Secondary | ICD-10-CM

## 2021-09-28 ENCOUNTER — Other Ambulatory Visit: Payer: Self-pay

## 2021-09-28 ENCOUNTER — Encounter: Payer: Self-pay | Admitting: Pulmonary Disease

## 2021-09-28 ENCOUNTER — Ambulatory Visit (INDEPENDENT_AMBULATORY_CARE_PROVIDER_SITE_OTHER): Payer: Medicare Other | Admitting: Pulmonary Disease

## 2021-09-28 VITALS — BP 144/72 | HR 86 | Temp 98.1°F | Ht 60.0 in | Wt 208.0 lb

## 2021-09-28 DIAGNOSIS — G4733 Obstructive sleep apnea (adult) (pediatric): Secondary | ICD-10-CM

## 2021-09-28 DIAGNOSIS — J849 Interstitial pulmonary disease, unspecified: Secondary | ICD-10-CM

## 2021-09-28 LAB — CBC WITH DIFFERENTIAL/PLATELET
Basophils Absolute: 0 10*3/uL (ref 0.0–0.1)
Basophils Relative: 0.5 % (ref 0.0–3.0)
Eosinophils Absolute: 0.2 10*3/uL (ref 0.0–0.7)
Eosinophils Relative: 3.5 % (ref 0.0–5.0)
HCT: 34.1 % — ABNORMAL LOW (ref 36.0–46.0)
Hemoglobin: 11.1 g/dL — ABNORMAL LOW (ref 12.0–15.0)
Lymphocytes Relative: 13.8 % (ref 12.0–46.0)
Lymphs Abs: 0.9 10*3/uL (ref 0.7–4.0)
MCHC: 32.6 g/dL (ref 30.0–36.0)
MCV: 87.1 fl (ref 78.0–100.0)
Monocytes Absolute: 0.7 10*3/uL (ref 0.1–1.0)
Monocytes Relative: 10.8 % (ref 3.0–12.0)
Neutro Abs: 4.7 10*3/uL (ref 1.4–7.7)
Neutrophils Relative %: 71.4 % (ref 43.0–77.0)
Platelets: 167 10*3/uL (ref 150.0–400.0)
RBC: 3.92 Mil/uL (ref 3.87–5.11)
RDW: 16.8 % — ABNORMAL HIGH (ref 11.5–15.5)
WBC: 6.6 10*3/uL (ref 4.0–10.5)

## 2021-09-28 LAB — PULMONARY FUNCTION TEST
DL/VA % pred: 105 %
DL/VA: 4.43 ml/min/mmHg/L
DLCO cor % pred: 67 %
DLCO cor: 10.99 ml/min/mmHg
DLCO unc % pred: 67 %
DLCO unc: 10.99 ml/min/mmHg
FEF 25-75 Post: 0.65 L/sec
FEF 25-75 Pre: 0.62 L/sec
FEF2575-%Change-Post: 5 %
FEF2575-%Pred-Post: 52 %
FEF2575-%Pred-Pre: 49 %
FEV1-%Change-Post: 2 %
FEV1-%Pred-Post: 64 %
FEV1-%Pred-Pre: 63 %
FEV1-Post: 1.04 L
FEV1-Pre: 1.02 L
FEV1FVC-%Change-Post: 0 %
FEV1FVC-%Pred-Pre: 93 %
FEV6-%Change-Post: 1 %
FEV6-%Pred-Post: 73 %
FEV6-%Pred-Pre: 72 %
FEV6-Post: 1.51 L
FEV6-Pre: 1.48 L
FEV6FVC-%Pred-Post: 106 %
FEV6FVC-%Pred-Pre: 106 %
FVC-%Change-Post: 1 %
FVC-%Pred-Post: 69 %
FVC-%Pred-Pre: 67 %
FVC-Post: 1.51 L
FVC-Pre: 1.48 L
Post FEV1/FVC ratio: 69 %
Post FEV6/FVC ratio: 100 %
Pre FEV1/FVC ratio: 69 %
Pre FEV6/FVC Ratio: 100 %
RV % pred: 114 %
RV: 2.48 L
TLC % pred: 92 %
TLC: 4.1 L

## 2021-09-28 MED ORDER — BREZTRI AEROSPHERE 160-9-4.8 MCG/ACT IN AERO: 2.0000 | INHALATION_SPRAY | Freq: Two times a day (BID) | RESPIRATORY_TRACT | 0 refills | Status: DC

## 2021-09-28 MED ORDER — MONTELUKAST SODIUM 10 MG PO TABS: 10.0000 mg | ORAL_TABLET | Freq: Every day | ORAL | 5 refills | Status: DC

## 2021-09-28 NOTE — Patient Instructions (Signed)
We will get some labs today including CBC differential, IgE, alpha-1 antitrypsin levels and phenotype Start Singulair 10 mg daily at night Continue breztri Continue Flonase.  Start using Zyrtec over-the-counter once a day Continue antiacid medication  Follow-up in 1 to 2 months

## 2021-09-28 NOTE — Progress Notes (Signed)
PFT done today. 

## 2021-09-28 NOTE — Progress Notes (Addendum)
Sherry Burke    [id number]    22-Jun-1941  Primary Care Physician:Cox, Elnita Maxwell, MD  Referring Physician: [name], MD [address] [place],  [place] [address]  Chief complaint: Consult for RA ILD, asthma, dyspnea  HPI: 80 year old with history of RA ILD, asthma, GERD, postnasal drip, OSA She was previously followed by Dr. Carlis Abbott Her rheumatoid arthritis was well controlled on methotrexate but stopped 4 to 5 years ago out of concern for methotrexate induced lung injury.  She continued to have joint symptoms and is currently on Xeljanz and Plaquenil under the care of Dr. Trudie Reed, rheumatology  Has history of COPD though she is a non-smoker.  She had been maintained on Spiriva, stiolto.  This was changed to breztri on 08/28/2021 during an office evaluation for worsening dyspnea, wheezing She had previously been on steroids which did not help her symptoms  History notable for OSA.  She stopped using the CPAP many years ago as she did not like it.  She feels he does not have apnea at present and does not want to reassess  Pets: No pets Occupation: Housewife Exposures: No mold, hot tub, Jacuzzi.  No feather pillows or comforter ILD questionnaire 09/28/2021-negative for exposures Smoking history: Never smoker Travel history: Originally from [place].  No significant travel history Relevant family history: Father died from lung cancer.  He was a smoker.   Outpatient Encounter Medications as of 09/28/2021  Medication Sig   albuterol (VENTOLIN HFA) 108 (90 Base) MCG/ACT inhaler Inhale 2 puffs into the lungs every 6 (six) hours as needed for wheezing or shortness of breath.   atorvastatin (LIPITOR) 40 MG tablet Take 1 tablet by mouth once daily   Budeson-Glycopyrrol-Formoterol (BREZTRI AEROSPHERE) 160-9-4.8 MCG/ACT AERO Inhale 2 puffs into the lungs in the morning and at bedtime.   cloNIDine (CATAPRES) 0.1 MG tablet Take 1 tablet by mouth twice daily   cyanocobalamin  2000 MCG tablet Take 2,000 mcg by mouth daily.   diphenhydramine-acetaminophen (TYLENOL PM) 25-500 MG TABS tablet Take 1 tablet by mouth at bedtime as needed.   estradiol (ESTRACE) 0.1 MG/GM vaginal cream    ferrous sulfate 325 (65 FE) MG tablet Take 325 mg by mouth daily with breakfast.   fluticasone (FLONASE) 50 MCG/ACT nasal spray Place 2 sprays into both nostrils daily. (Patient taking differently: Place 2 sprays into both nostrils daily as needed for allergies.)   gabapentin (NEURONTIN) 300 MG capsule Take 1 capsule by mouth twice daily   HYDROcodone-acetaminophen (NORCO/VICODIN) 5-325 MG tablet Take 1 tablet by mouth every 6 (six) hours as needed for moderate pain.   hydroxychloroquine (PLAQUENIL) 200 MG tablet Take 1 tablet (200 mg total) by mouth 2 (two) times daily.   lisinopril (ZESTRIL) 10 MG tablet Take 1 tablet (10 mg total) by mouth daily.   omeprazole (PRILOSEC) 40 MG capsule Take 1 capsule by mouth twice daily   ONETOUCH ULTRA test strip USE TO CHECK BLOOD SUGAR ONCE DAILY IN THE MORNING   PARoxetine (PAXIL-CR) 25 MG 24 hr tablet Take 1 tablet by mouth once daily   polyethylene glycol (MIRALAX / GLYCOLAX) 17 g packet Take 17 g by mouth daily. Use daily as needed for constipation   predniSONE (DELTASONE) 10 MG tablet Take 20mg  daily x 7 days; then take 10mg  daily until follow-up   rOPINIRole (REQUIP) 1 MG tablet TAKE 1 TABLET EVERY MORNINGAND 2 TABLETS AT BEDTIME   Tiotropium Bromide-Olodaterol (STIOLTO RESPIMAT) 2.5-2.5 MCG/ACT AERS Inhale  2 puffs into the lungs daily.   Tofacitinib Citrate (XELJANZ) 5 MG TABS Take 5 mg by mouth 2 (two) times daily. Take twice a day.   Vitamin D, Ergocalciferol, (DRISDOL) 1.25 MG (50000 UNIT) CAPS capsule Take 1 capsule by mouth once a week   nitrofurantoin, macrocrystal-monohydrate, (MACROBID) 100 MG capsule Take 1 capsule (100 mg total) by mouth 2 (two) times daily. (Patient not taking: Reported on 09/28/2021)   No facility-administered  encounter medications on file as of 09/28/2021.    Allergies as of 09/28/2021 - Review Complete 09/28/2021  Allergen Reaction Noted   Sulfa antibiotics Nausea And Vomiting 12/27/2019   Actemra [tocilizumab]  12/27/2019   Amlodipine  12/27/2019   Methotrexate derivatives Other (See Comments) 12/27/2019   Niaspan [niacin]  12/27/2019   Remicade [infliximab] Nausea And Vomiting and Other (See Comments) 01/29/2012   Welchol [colesevelam] Other (See Comments) 12/27/2019    Past Medical History:  Diagnosis Date   Acute renal insufficiency    Asthma    Chronic bronchitis    COPD (chronic obstructive pulmonary disease) (HCC)    Depression    History of migraine headaches    Hyperlipemia    OSA (obstructive sleep apnea)    pt states she doesn't have it   Osteoarthritis    Pneumonia    Rheumatoid arthritis(714.0)    RLS (restless legs syndrome)    Vitamin D deficiency disease     Past Surgical History:  Procedure Laterality Date   KNEE SURGERY Bilateral    when younger   TOTAL ABDOMINAL HYSTERECTOMY  06/1988    Family History  Problem Relation Age of Onset   Lung cancer Father    Stroke Mother    Hypertension Mother    Diabetes Mother    Rheum arthritis Sister    Rheum arthritis Sister     Social History   Socioeconomic History   Marital status: Married    Spouse name: Durene Fruits   Number of children: 3   Years of education: Not on file   Highest education level: Not on file  Occupational History   Occupation: retired    Comment: homemaker  Tobacco Use   Smoking status: Never    Passive exposure: Yes   Smokeless tobacco: Never  Vaping Use   Vaping Use: Never used  Substance and Sexual Activity   Alcohol use: No   Drug use: No   Sexual activity: Not Currently    Birth control/protection: None    Comment: Married  Other Topics Concern   Not on file  Social History Narrative   Lives with her husband, who is very helpful and supportive.   Social Determinants  of Radio broadcast assistant Strain: Not on file  Food Insecurity: No Food Insecurity   Worried About Charity fundraiser in the Last Year: Never true   Ran Out of Food in the Last Year: Never true  Transportation Needs: Not on file  Physical Activity: Not on file  Stress: Not on file  Social Connections: Not on file  Intimate Partner Violence: Not on file    Review of systems: Review of Systems  Constitutional: Negative for fever and chills.  HENT: Negative.   Eyes: Negative for blurred vision.  Respiratory: as per HPI  Cardiovascular: Negative for chest pain and palpitations.  Gastrointestinal: Negative for vomiting, diarrhea, blood per rectum. Genitourinary: Negative for dysuria, urgency, frequency and hematuria.  Musculoskeletal: Negative for myalgias, back pain and joint pain.  Skin: Negative for  itching and rash.  Neurological: Negative for dizziness, tremors, focal weakness, seizures and loss of consciousness.  Endo/Heme/Allergies: Negative for environmental allergies.  Psychiatric/Behavioral: Negative for depression, suicidal ideas and hallucinations.  All other systems reviewed and are negative.  Physical Exam: Blood pressure (!) 144/72, pulse 86, temperature 98.1 F (36.7 C), temperature source Oral, height 5' (1.524 m), weight 208 lb (94.3 kg), SpO2 96 %. Gen:      No acute distress HEENT:  EOMI, sclera anicteric Neck:     No masses; no thyromegaly Lungs:    Clear to auscultation bilaterally; normal respiratory effort CV:         Regular rate and rhythm; no murmurs Abd:      + bowel sounds; soft, non-tender; no palpable masses, no distension Ext:    No edema; adequate peripheral perfusion Skin:      Warm and dry; no rash Neuro: alert and oriented x 3 Psych: normal mood and affect  Data Reviewed: Imaging: High-res CT 09/08/2019-peripheral reticular densities, bronchiolectasis, lung nodules.  High-res CT 04/18/2020-stable scarring, basal  fibrosis  High-resolution CT 06/30/2021- Minimal subpleural reticulation and traction bronchiectasis in the lower lobes, biapical scarring, calcified granuloma  I have reviewed the images personally  PFTs: 11/28/2020 FVC 1.51 [9%], FEV1 1.04 [64%], F/F 69, TLC 4.10 [92%], DLCO 10.99 [67%] Moderate diffusion defect  Labs: CBC 03/30/2021-WBC 7.3, eos 4%, absolute eosinophil count 292  Assessment:  Asthma She likely has reactive airway disease given presentation with wheezing.  Although she has diagnosis of COPD in the chart there is no significant obstruction and she is a non-smoker. Has mildly elevated peripheral eosinophils and seems to be slightly better with breztri compared stiolto Suspect her GERD and postnasal drip is playing a role and presentation Also consider upper airway disease, vocal cord issues  We will check a CBC differential, IgE and alpha-1 antitrypsin levels and phenotype Start Singulair Consider biologics if she continues to be symptomatic  Continue PPI twice daily.  For postnasal drip she will continue Flonase.  I have asked her to start taking Zyrtec over-the-counter  RA ILD I have reviewed her scans dating back to 2015.  She has very minimal changes at the base which is unchanged.  No specific treatment required at present She continues on Xeljanz and Plaquenil per her rheumatologist Continue monitoring.  Of note she is on chronic nitrofurantoin and has been on methotrexate and amiodarone in the past  Plan/Recommendations: Continue breztri Start Singulair PPI Flonase, Zyrtec  Marshell Garfinkel MD [facility] 09/28/2021, 2:14 PM  CC: [name], MD

## 2021-10-03 ENCOUNTER — Telehealth: Payer: Self-pay

## 2021-10-03 NOTE — Chronic Care Management (AMB) (Signed)
Chronic Care Management Pharmacy Assistant   Name: Sherry Burke  MRN: [id number] DOB: [birth year]   Reason for Encounter: Disease State for HTN   Recent office visits:  None   Recent consult visits:  09/28/21 (Pulmonology) Marshell Garfinkel MD. Seen for Interstitial Lung Diease. Started on Montelukast Sodium 10 mg daily.   08/28/21 (Pulmonology) Geraldo Pitter NP. Seen for Interstitial Lung Diease. Started on Breztri 160-9-4.8 mcg 2 puffs 2 times daily. Started on Prednisone 10 mg  20mg  x 1 week; then stay on 10mg  daily until follow-up . Instructed to stop Stiolto.   Hospital visits:  None   Medications: Outpatient Encounter Medications as of 10/03/2021  Medication Sig   albuterol (VENTOLIN HFA) 108 (90 Base) MCG/ACT inhaler Inhale 2 puffs into the lungs every 6 (six) hours as needed for wheezing or shortness of breath.   atorvastatin (LIPITOR) 40 MG tablet Take 1 tablet by mouth once daily   Budeson-Glycopyrrol-Formoterol (BREZTRI AEROSPHERE) 160-9-4.8 MCG/ACT AERO Inhale 2 puffs into the lungs in the morning and at bedtime.   Budeson-Glycopyrrol-Formoterol (BREZTRI AEROSPHERE) 160-9-4.8 MCG/ACT AERO Inhale 2 puffs into the lungs in the morning and at bedtime.   cloNIDine (CATAPRES) 0.1 MG tablet Take 1 tablet by mouth twice daily   cyanocobalamin 2000 MCG tablet Take 2,000 mcg by mouth daily.   diphenhydramine-acetaminophen (TYLENOL PM) 25-500 MG TABS tablet Take 1 tablet by mouth at bedtime as needed.   estradiol (ESTRACE) 0.1 MG/GM vaginal cream    ferrous sulfate 325 (65 FE) MG tablet Take 325 mg by mouth daily with breakfast.   fluticasone (FLONASE) 50 MCG/ACT nasal spray Place 2 sprays into both nostrils daily. (Patient taking differently: Place 2 sprays into both nostrils daily as needed for allergies.)   gabapentin (NEURONTIN) 300 MG capsule Take 1 capsule by mouth twice daily   HYDROcodone-acetaminophen (NORCO/VICODIN) 5-325 MG tablet Take 1 tablet by mouth every 6 (six)  hours as needed for moderate pain.   hydroxychloroquine (PLAQUENIL) 200 MG tablet Take 1 tablet (200 mg total) by mouth 2 (two) times daily.   lisinopril (ZESTRIL) 10 MG tablet Take 1 tablet (10 mg total) by mouth daily.   montelukast (SINGULAIR) 10 MG tablet Take 1 tablet (10 mg total) by mouth at bedtime.   nitrofurantoin, macrocrystal-monohydrate, (MACROBID) 100 MG capsule Take 1 capsule (100 mg total) by mouth 2 (two) times daily. (Patient not taking: Reported on 09/28/2021)   omeprazole (PRILOSEC) 40 MG capsule Take 1 capsule by mouth twice daily   ONETOUCH ULTRA test strip USE TO CHECK BLOOD SUGAR ONCE DAILY IN THE MORNING   PARoxetine (PAXIL-CR) 25 MG 24 hr tablet Take 1 tablet by mouth once daily   polyethylene glycol (MIRALAX / GLYCOLAX) 17 g packet Take 17 g by mouth daily. Use daily as needed for constipation   predniSONE (DELTASONE) 10 MG tablet Take 20mg  daily x 7 days; then take 10mg  daily until follow-up   rOPINIRole (REQUIP) 1 MG tablet TAKE 1 TABLET EVERY MORNINGAND 2 TABLETS AT BEDTIME   Tiotropium Bromide-Olodaterol (STIOLTO RESPIMAT) 2.5-2.5 MCG/ACT AERS Inhale 2 puffs into the lungs daily.   Tofacitinib Citrate (XELJANZ) 5 MG TABS Take 5 mg by mouth 2 (two) times daily. Take twice a day.   Vitamin D, Ergocalciferol, (DRISDOL) 1.25 MG (50000 UNIT) CAPS capsule Take 1 capsule by mouth once a week   No facility-administered encounter medications on file as of 10/03/2021.    Recent Office Vitals: BP Readings from Last 3 Encounters:  09/28/21 (!) 144/72  08/28/21 118/72  05/24/21 118/70   Pulse Readings from Last 3 Encounters:  09/28/21 86  08/28/21 86  05/24/21 81    Wt Readings from Last 3 Encounters:  09/28/21 208 lb (94.3 kg)  08/28/21 205 lb 3.2 oz (93.1 kg)  05/24/21 200 lb 3.2 oz (90.8 kg)     Kidney Function Lab Results  Component Value Date/Time   CREATININE 1.22 (H) 03/30/2021 11:29 AM   CREATININE 1.15 (H) 02/05/2021 10:21 AM   GFRNONAA 40 (L)  01/01/2021 10:16 AM   GFRAA 46 (L) 01/01/2021 10:16 AM    BMP Latest Ref Rng & Units 03/30/2021 02/05/2021 01/01/2021  Glucose 65 - 99 mg/dL 90 109(H) 97  BUN 8 - 27 mg/dL 26 22 25   Creatinine 0.57 - 1.00 mg/dL 1.22(H) 1.15(H) 1.28(H)  BUN/Creat Ratio 12 - 28 21 19 20   Sodium 134 - 144 mmol/L 142 142 139  Potassium 3.5 - 5.2 mmol/L 4.4 4.5 4.9  Chloride 96 - 106 mmol/L 104 106 102  CO2 20 - 29 mmol/L 21 22 19(L)  Calcium 8.7 - 10.3 mg/dL 9.3 8.7 9.5     Current antihypertensive regimen:  Lisinopril 10 mg daily  Clonidine 0.1 mg BID   Patient verbally confirms she is taking the above medications as directed. Yes  How often are you checking your Blood Pressure? infrequently  she checks her blood pressure in the morning before taking her medication.  Current home BP readings: 128/70 the last few weeks. She is not checking them daily.   Wrist or arm cuff: Arm  Caffeine intake:Drinks several cups of coffee  Salt intake:Limited  OTC medications including pseudoephedrine or NSAIDs? Pt didn't state  Any readings above 180/120? No  What recent interventions/DTPs have been made by any provider to improve Blood Pressure control since last CPP Visit: Pt stated there has not been changes   Any recent hospitalizations or ED visits since last visit with CPP? No recent visits   What diet changes have been made to improve Blood Pressure Control?  Pt stated no changes and will eat what she wants   What exercise is being done to improve your Blood Pressure Control?  Pt stated she cannot walk and is mostly chair bound so she is not getting much exercise. Pt stated her legs and back hurt due to arthritis so she stays in her chair.  Adherence Review: Is the patient currently on ACE/ARB medication? Yes Does the patient have >5 day gap between last estimated fill dates? No   Care Gaps: Last annual wellness visit? None noted   Star Rating Drugs:  Medication:  Last Fill: Day  Supply Lisinopril   07/10/21 90ds Atorvastatin   06/30/21 [place], [place] Pharmacist Assistant  [phone]

## 2021-10-05 ENCOUNTER — Other Ambulatory Visit: Payer: Self-pay | Admitting: Family Medicine

## 2021-10-09 ENCOUNTER — Ambulatory Visit: Payer: Self-pay

## 2021-10-09 DIAGNOSIS — I10 Essential (primary) hypertension: Secondary | ICD-10-CM

## 2021-10-09 DIAGNOSIS — E1169 Type 2 diabetes mellitus with other specified complication: Secondary | ICD-10-CM

## 2021-10-09 DIAGNOSIS — F330 Major depressive disorder, recurrent, mild: Secondary | ICD-10-CM

## 2021-10-09 DIAGNOSIS — K219 Gastro-esophageal reflux disease without esophagitis: Secondary | ICD-10-CM

## 2021-10-09 LAB — ALPHA-1 ANTITRYPSIN PHENOTYPE: A-1 Antitrypsin, Ser: 150 mg/dL (ref 83–199)

## 2021-10-09 LAB — IGE: IgE (Immunoglobulin E), Serum: 4 kU/L (ref <=114)

## 2021-10-09 NOTE — Progress Notes (Signed)
Updated blood pressure in chart with patient-reported home BP:  BP Readings from Last 3 Encounters:  09/28/21 (!) 144/72  08/28/21 118/72  05/24/21 118/70    There are no care plans that you recently modified to display for this patient.   Lane Hacker, [facility]

## 2021-10-10 ENCOUNTER — Encounter: Payer: Self-pay | Admitting: Family Medicine

## 2021-10-10 ENCOUNTER — Ambulatory Visit (INDEPENDENT_AMBULATORY_CARE_PROVIDER_SITE_OTHER): Payer: Medicare Other | Admitting: Family Medicine

## 2021-10-10 VITALS — BP 144/68 | HR 103 | Temp 97.4°F | Ht 60.0 in | Wt 208.0 lb

## 2021-10-10 DIAGNOSIS — J4540 Moderate persistent asthma, uncomplicated: Secondary | ICD-10-CM | POA: Diagnosis not present

## 2021-10-10 DIAGNOSIS — M1711 Unilateral primary osteoarthritis, right knee: Secondary | ICD-10-CM | POA: Diagnosis not present

## 2021-10-10 DIAGNOSIS — R300 Dysuria: Secondary | ICD-10-CM

## 2021-10-10 DIAGNOSIS — N3000 Acute cystitis without hematuria: Secondary | ICD-10-CM | POA: Diagnosis not present

## 2021-10-10 LAB — POCT URINALYSIS DIPSTICK
Bilirubin, UA: NEGATIVE
Blood, UA: NEGATIVE
Glucose, UA: NEGATIVE
Ketones, UA: NEGATIVE
Nitrite, UA: NEGATIVE
Protein, UA: POSITIVE — AB
Spec Grav, UA: 1.025 (ref 1.010–1.025)
Urobilinogen, UA: NEGATIVE E.U./dL — AB
pH, UA: 6 (ref 5.0–8.0)

## 2021-10-10 MED ORDER — CIPROFLOXACIN HCL 250 MG PO TABS: 250.0000 mg | ORAL_TABLET | Freq: Two times a day (BID) | ORAL | 0 refills | Status: AC

## 2021-10-10 NOTE — Progress Notes (Signed)
Acute Office Visit  Subjective:    Patient ID: Sherry Burke, female    DOB: Jun 25, 1941, 80 y.o.   MRN: [id number]  Chief Complaint  Patient presents with   Bilateral knee pain   Urinary Tract Infection   HPI: Asthma: Patient has been seen pulmonology, Dr. Vaughan Browner.  Patient is currently on breztri 2 puffs twice daily, Singulair 10 mg once daily, Flonase nasal spray, and albuterol inhaler.  She uses albuterol inhaler approximately 5 times a day.  Patient gets notably short of breath with exertion.  This is usually when she is requiring the albuterol. Just came off prednisone.Took prednisone 20 mg x 7 days and the dropped to 10 mg daily until gone. Completed mid November and did not feel it helped.   Patient is also presenting for urinary symptoms.  Patient has chills and mild dysuria.  Frequently she has urinary tract infections without symptoms.  BL knee pain: last injection was February 2022.Marland Kitchen   Urinary Tract Infection  Associated symptoms include chills and frequency. Pertinent negatives include no hematuria, nausea or vomiting.   Past Medical History:  Diagnosis Date   Acute renal insufficiency    Asthma    Chronic bronchitis    COPD (chronic obstructive pulmonary disease) (HCC)    Depression    History of migraine headaches    Hyperlipemia    OSA (obstructive sleep apnea)    pt states she doesn't have it   Osteoarthritis    Pneumonia    Rheumatoid arthritis(714.0)    RLS (restless legs syndrome)    Vitamin D deficiency disease     Past Surgical History:  Procedure Laterality Date   KNEE SURGERY Bilateral    when younger   TOTAL ABDOMINAL HYSTERECTOMY  06/1988    Family History  Problem Relation Age of Onset   Lung cancer Father    Stroke Mother    Hypertension Mother    Diabetes Mother    Rheum arthritis Sister    Rheum arthritis Sister     Social History   Socioeconomic History   Marital status: Married    Spouse name: Durene Fruits   Number of children: 3    Years of education: Not on file   Highest education level: Not on file  Occupational History   Occupation: retired    Comment: homemaker  Tobacco Use   Smoking status: Never    Passive exposure: Yes   Smokeless tobacco: Never  Vaping Use   Vaping Use: Never used  Substance and Sexual Activity   Alcohol use: No   Drug use: No   Sexual activity: Not Currently    Birth control/protection: None    Comment: Married  Other Topics Concern   Not on file  Social History Narrative   Lives with her husband, who is very helpful and supportive.   Social Determinants of Radio broadcast assistant Strain: Not on file  Food Insecurity: No Food Insecurity   Worried About Charity fundraiser in the Last Year: Never true   Ran Out of Food in the Last Year: Never true  Transportation Needs: Not on file  Physical Activity: Not on file  Stress: Not on file  Social Connections: Not on file  Intimate Partner Violence: Not on file    Outpatient Medications Prior to Visit  Medication Sig Dispense Refill   albuterol (VENTOLIN HFA) 108 (90 Base) MCG/ACT inhaler Inhale 2 puffs into the lungs every 6 (six) hours as needed for wheezing or shortness  of breath. 18 g 11   atorvastatin (LIPITOR) 40 MG tablet Take 1 tablet by mouth once daily 90 tablet 0   Budeson-Glycopyrrol-Formoterol (BREZTRI AEROSPHERE) 160-9-4.8 MCG/ACT AERO Inhale 2 puffs into the lungs in the morning and at bedtime. 10.7 g 0   cloNIDine (CATAPRES) 0.1 MG tablet Take 1 tablet by mouth twice daily 180 tablet 0   cyanocobalamin 2000 MCG tablet Take 2,000 mcg by mouth daily.     diphenhydramine-acetaminophen (TYLENOL PM) 25-500 MG TABS tablet Take 1 tablet by mouth at bedtime as needed.     estradiol (ESTRACE) 0.1 MG/GM vaginal cream      ferrous sulfate 325 (65 FE) MG tablet Take 325 mg by mouth daily with breakfast.     fluticasone (FLONASE) 50 MCG/ACT nasal spray Place 2 sprays into both nostrils daily. (Patient taking differently:  Place 2 sprays into both nostrils daily as needed for allergies.) 16 g 6   gabapentin (NEURONTIN) 300 MG capsule Take 1 capsule by mouth twice daily 180 capsule 0   HYDROcodone-acetaminophen (NORCO/VICODIN) 5-325 MG tablet Take 1 tablet by mouth every 6 (six) hours as needed for moderate pain. 120 tablet 0   hydroxychloroquine (PLAQUENIL) 200 MG tablet Take 1 tablet (200 mg total) by mouth 2 (two) times daily. 60 tablet 2   lisinopril (ZESTRIL) 10 MG tablet Take 1 tablet by mouth once daily 90 tablet 0   montelukast (SINGULAIR) 10 MG tablet Take 1 tablet (10 mg total) by mouth at bedtime. 30 tablet 5   nitrofurantoin, macrocrystal-monohydrate, (MACROBID) 100 MG capsule Take 1 capsule (100 mg total) by mouth 2 (two) times daily. 60 capsule 2   omeprazole (PRILOSEC) 40 MG capsule Take 1 capsule by mouth twice daily 180 capsule 0   ONETOUCH ULTRA test strip USE TO CHECK BLOOD SUGAR ONCE DAILY IN THE MORNING     PARoxetine (PAXIL-CR) 25 MG 24 hr tablet Take 1 tablet by mouth once daily 90 tablet 0   polyethylene glycol (MIRALAX / GLYCOLAX) 17 g packet Take 17 g by mouth daily. Use daily as needed for constipation     rOPINIRole (REQUIP) 1 MG tablet TAKE 1 TABLET EVERY MORNINGAND 2 TABLETS AT BEDTIME 270 tablet 3   Tofacitinib Citrate (XELJANZ) 5 MG TABS Take 5 mg by mouth 2 (two) times daily. Take twice a day.     Vitamin D, Ergocalciferol, (DRISDOL) 1.25 MG (50000 UNIT) CAPS capsule Take 1 capsule by mouth once a week 12 capsule 3   predniSONE (DELTASONE) 10 MG tablet Take 66m daily x 7 days; then take 167mdaily until follow-up 30 tablet 0   Tiotropium Bromide-Olodaterol (STIOLTO RESPIMAT) 2.5-2.5 MCG/ACT AERS Inhale 2 puffs into the lungs daily. 4 g 11   Budeson-Glycopyrrol-Formoterol (BREZTRI AEROSPHERE) 160-9-4.8 MCG/ACT AERO Inhale 2 puffs into the lungs in the morning and at bedtime. 10.7 g 0   No facility-administered medications prior to visit.    Allergies  Allergen Reactions   Sulfa  Antibiotics Nausea And Vomiting   Actemra [Tocilizumab]    Amlodipine    Methotrexate Derivatives Other (See Comments)    sweating   Niaspan [Niacin]     Severe flushing   Remicade [Infliximab] Nausea And Vomiting and Other (See Comments)    Affects nervous system   Welchol [Colesevelam] Other (See Comments)    Abdominal pain and headache    Review of Systems  Constitutional:  Positive for chills. Negative for appetite change, fatigue and fever.  HENT:  Negative for congestion, ear pain,  sinus pressure and sore throat.   Eyes:  Negative for pain.  Respiratory:  Positive for cough, shortness of breath and wheezing. Negative for chest tightness.   Cardiovascular:  Negative for chest pain and palpitations.  Gastrointestinal:  Negative for abdominal pain, constipation, diarrhea, nausea and vomiting.  Genitourinary:  Positive for dyspareunia, dysuria and frequency. Negative for hematuria.  Musculoskeletal:  Positive for arthralgias (Bilateral knees). Negative for back pain, joint swelling and myalgias.  Skin:  Negative for rash.  Neurological:  Negative for dizziness, weakness and headaches.  Psychiatric/Behavioral:  Negative for dysphoric mood. The patient is not nervous/anxious.       Objective:    Physical Exam Vitals reviewed.  Constitutional:      Appearance: Normal appearance. She is obese.  Cardiovascular:     Rate and Rhythm: Normal rate and regular rhythm.     Heart sounds: Normal heart sounds.  Pulmonary:     Effort: Pulmonary effort is normal. No respiratory distress.     Breath sounds: Wheezing (Wheezing in the throat.) present.     Comments: Good air exchange. Occ. Wheeze. Abdominal:     General: Abdomen is flat. Bowel sounds are normal.     Palpations: Abdomen is soft.     Tenderness: There is no abdominal tenderness.  Musculoskeletal:        General: Tenderness (Right and Left knee are tender.) present.  Neurological:     Mental Status: She is alert and  oriented to person, place, and time.  Psychiatric:        Mood and Affect: Mood normal.        Behavior: Behavior normal.    BP (!) 144/68 (BP Location: Left Arm, Patient Position: Sitting)   Pulse (!) 103   Temp (!) 97.4 F (36.3 C) (Temporal)   Ht 5' (1.524 m)   Wt 208 lb (94.3 kg)   SpO2 94%   BMI 40.62 kg/m  Wt Readings from Last 3 Encounters:  10/10/21 208 lb (94.3 kg)  09/28/21 208 lb (94.3 kg)  08/28/21 205 lb 3.2 oz (93.1 kg)    Health Maintenance Due  Topic Date Due   OPHTHALMOLOGY EXAM  Never done   Zoster Vaccines- Shingrix (1 of 2) Never done   COVID-19 Vaccine (3 - Moderna risk series) 08/22/2021   TETANUS/TDAP  09/30/2021   HEMOGLOBIN A1C  09/30/2021    There are no preventive care reminders to display for this patient.   Lab Results  Component Value Date   TSH 2.080 03/30/2021   Lab Results  Component Value Date   WBC 6.6 09/28/2021   HGB 11.1 (L) 09/28/2021   HCT 34.1 (L) 09/28/2021   MCV 87.1 09/28/2021   PLT 167.0 09/28/2021   Lab Results  Component Value Date   NA 142 03/30/2021   K 4.4 03/30/2021   CO2 21 03/30/2021   GLUCOSE 90 03/30/2021   BUN 26 03/30/2021   CREATININE 1.22 (H) 03/30/2021   BILITOT 0.3 03/30/2021   ALKPHOS 76 03/30/2021   AST 27 03/30/2021   ALT 19 03/30/2021   PROT 6.9 03/30/2021   ALBUMIN 4.5 03/30/2021   CALCIUM 9.3 03/30/2021   EGFR 45 (L) 03/30/2021   Lab Results  Component Value Date   CHOL 169 03/30/2021   Lab Results  Component Value Date   HDL 77 03/30/2021   Lab Results  Component Value Date   LDLCALC 76 03/30/2021   Lab Results  Component Value Date   TRIG 86 03/30/2021  Lab Results  Component Value Date   CHOLHDL 2.2 03/30/2021   Lab Results  Component Value Date   HGBA1C 6.0 (H) 03/30/2021         Assessment & Plan:   Problem List Items Addressed This Visit       Respiratory   Moderate persistent asthma    The current medical regimen is somewhat effective;  Continue  Singulair, breztri, albuterol. Call and follow up with Dr. Vaughan Browner. Wheezing seems to be in throat versus lungs. Pt is on omeprazole already.         Musculoskeletal and Integument   Primary osteoarthritis of right knee    Right Knee injection on inferior lateral area. Risks were discussed including bleeding, infection, increase in sugars if diabetic, atrophy at site of injection, and increased pain.  After consent was obtained, using sterile technique the Right Knee was prepped with betadine and alcohol.  The joint was entered and  Kenalog 80 mg and 5 ml plain Lidocaine was then injected and the needle withdrawn.  The procedure was well tolerated.   The patient is asked to continue to rest the joint for a few more days before resuming regular activities.  It may be more painful for the first 1-2 days.  Watch for fever, or increased swelling or persistent pain in the joint. Call or return to clinic prn if such symptoms occur or there is failure to improve as anticipated.        Genitourinary   Acute cystitis without hematuria - Primary    Ordered UA and urine culture. Start Cipro 250 mg 1 tablet BID x 7 days.      Relevant Orders   Urine Culture (Completed)   POCT urinalysis dipstick (Completed)    Meds ordered this encounter  Medications   ciprofloxacin (CIPRO) 250 MG tablet    Sig: Take 1 tablet (250 mg total) by mouth 2 (two) times daily for 7 days.    Dispense:  14 tablet    Refill:  0      Lauren Vic Ripper and Deatra Robinson Borjas,acted as scribes for [name], MD.,have documented all relevant documentation on the behalf of [name], MD,as directed by  [name], MD while in the presence of [name], MD.   [name], MD

## 2021-10-10 NOTE — Patient Instructions (Addendum)
Start cipro 250 mg one twice a day x 7 days.

## 2021-10-12 LAB — URINE CULTURE

## 2021-10-13 DIAGNOSIS — J4540 Moderate persistent asthma, uncomplicated: Secondary | ICD-10-CM | POA: Insufficient documentation

## 2021-10-13 DIAGNOSIS — M1711 Unilateral primary osteoarthritis, right knee: Secondary | ICD-10-CM | POA: Insufficient documentation

## 2021-10-13 NOTE — Assessment & Plan Note (Addendum)
The current medical regimen is somewhat effective;  Continue Singulair, breztri, albuterol. Call and follow up with Dr. Vaughan Browner. Wheezing seems to be in throat versus lungs. Pt is on omeprazole already.

## 2021-10-13 NOTE — Assessment & Plan Note (Addendum)
Right Knee injection on inferior lateral area. Risks were discussed including bleeding, infection, increase in sugars if diabetic, atrophy at site of injection, and increased pain.  After consent was obtained, using sterile technique the Right Knee was prepped with betadine and alcohol.  The joint was entered and  Kenalog 80 mg and 5 ml plain Lidocaine was then injected and the needle withdrawn.  The procedure was well tolerated.   The patient is asked to continue to rest the joint for a few more days before resuming regular activities.  It may be more painful for the first 1-2 days.  Watch for fever, or increased swelling or persistent pain in the joint. Call or return to clinic prn if such symptoms occur or there is failure to improve as anticipated.

## 2021-10-13 NOTE — Assessment & Plan Note (Signed)
Ordered UA and urine culture. Start Cipro 250 mg 1 tablet BID x 7 days.

## 2021-10-20 DIAGNOSIS — N390 Urinary tract infection, site not specified: Secondary | ICD-10-CM | POA: Insufficient documentation

## 2021-10-20 DIAGNOSIS — R300 Dysuria: Secondary | ICD-10-CM | POA: Insufficient documentation

## 2021-10-23 ENCOUNTER — Ambulatory Visit: Payer: Medicare Other | Admitting: Family Medicine

## 2021-10-29 NOTE — Progress Notes (Signed)
Acute Office Visit  Subjective:    Patient ID: Sherry Burke, female    DOB: 10/04/1941, 80 y.o.   MRN: [id number]  Chief Complaint  Patient presents with   Left knee injection    HPI Patient is in today for left knee injection.  Type Diabetes with hyperlipidemia: pt checks sugars sporadically. Usually run 120-140s. Checks feet daily.  Mixed hyperlipidemia: On lipitor 40 mg once daily.  Back pain: on gabapentin.  RA: ON plaquenil and xeljanz. Pt had rash on leg which cleared up with [name]. Was not dxd with psoriasis on biopsy.  Left knee OA. Requesting knee injection.  Pt becomes dypsnea with even minimal exertion. She has not had a walking oxygen test. Pulmonary, Dr Vaughan Browner, started on singulair, flonase, breztri. She stopped singulair due to nausea. Held breztri due to recurrent thrush. Pt took some nystatin swish and it helped.  Breztri did not make any improvement in her breathing.  OSA diagnosed by Dr. Alcide Clever. Pt is not using cpap. Family does not notice apnea, snoring. Feels rested during the day.    Past Medical History:  Diagnosis Date   Acute renal insufficiency    Asthma    Chronic bronchitis    COPD (chronic obstructive pulmonary disease) (HCC)    Depression    History of migraine headaches    Hyperlipemia    OSA (obstructive sleep apnea)    pt states she doesn't have it   Osteoarthritis    Pneumonia    Rheumatoid arthritis(714.0)    RLS (restless legs syndrome)    Vitamin D deficiency disease     Past Surgical History:  Procedure Laterality Date   KNEE SURGERY Bilateral    when younger   TOTAL ABDOMINAL HYSTERECTOMY  06/1988    Family History  Problem Relation Age of Onset   Lung cancer Father    Stroke Mother    Hypertension Mother    Diabetes Mother    Rheum arthritis Sister    Rheum arthritis Sister     Social History   Socioeconomic History   Marital status: Married    Spouse name: Durene Fruits   Number of children: 3    Years of education: Not on file   Highest education level: Not on file  Occupational History   Occupation: retired    Comment: homemaker  Tobacco Use   Smoking status: Never    Passive exposure: Yes   Smokeless tobacco: Never  Vaping Use   Vaping Use: Never used  Substance and Sexual Activity   Alcohol use: No   Drug use: No   Sexual activity: Not Currently    Birth control/protection: None    Comment: Married  Other Topics Concern   Not on file  Social History Narrative   Lives with her husband, who is very helpful and supportive.   Social Determinants of Radio broadcast assistant Strain: Not on file  Food Insecurity: No Food Insecurity   Worried About Charity fundraiser in the Last Year: Never true   Ran Out of Food in the Last Year: Never true  Transportation Needs: Not on file  Physical Activity: Not on file  Stress: Not on file  Social Connections: Not on file  Intimate Partner Violence: Not on file    Outpatient Medications Prior to Visit  Medication Sig Dispense Refill   albuterol (VENTOLIN HFA) 108 (90 Base) MCG/ACT inhaler Inhale 2 puffs into the lungs every 6 (six) hours as needed for wheezing or  shortness of breath. 18 g 11   atorvastatin (LIPITOR) 40 MG tablet Take 1 tablet by mouth once daily 90 tablet 0   Budeson-Glycopyrrol-Formoterol (BREZTRI AEROSPHERE) 160-9-4.8 MCG/ACT AERO Inhale 2 puffs into the lungs in the morning and at bedtime. 10.7 g 0   cloNIDine (CATAPRES) 0.1 MG tablet Take 1 tablet by mouth twice daily 180 tablet 0   cyanocobalamin 2000 MCG tablet Take 2,000 mcg by mouth daily.     diphenhydramine-acetaminophen (TYLENOL PM) 25-500 MG TABS tablet Take 1 tablet by mouth at bedtime as needed.     ferrous sulfate 325 (65 FE) MG tablet Take 325 mg by mouth daily with breakfast.     gabapentin (NEURONTIN) 300 MG capsule Take 1 capsule by mouth twice daily 180 capsule 0   HYDROcodone-acetaminophen (NORCO/VICODIN) 5-325 MG  tablet Take 1 tablet by mouth every 6 (six) hours as needed for moderate pain. 120 tablet 0   hydroxychloroquine (PLAQUENIL) 200 MG tablet Take 1 tablet (200 mg total) by mouth 2 (two) times daily. 60 tablet 2   lisinopril (ZESTRIL) 10 MG tablet Take 1 tablet by mouth once daily 90 tablet 0   montelukast (SINGULAIR) 10 MG tablet Take 1 tablet (10 mg total) by mouth at bedtime. 30 tablet 5   omeprazole (PRILOSEC) 40 MG capsule Take 1 capsule by mouth twice daily 180 capsule 0   ONETOUCH ULTRA test strip USE TO CHECK BLOOD SUGAR ONCE DAILY IN THE MORNING     PARoxetine (PAXIL-CR) 25 MG 24 hr tablet Take 1 tablet by mouth once daily 90 tablet 0   rOPINIRole (REQUIP) 1 MG tablet TAKE 1 TABLET EVERY MORNINGAND 2 TABLETS AT BEDTIME 270 tablet 3   Tofacitinib Citrate (XELJANZ) 5 MG TABS Take 5 mg by mouth 2 (two) times daily. Take twice a day.     Vitamin D, Ergocalciferol, (DRISDOL) 1.25 MG (50000 UNIT) CAPS capsule Take 1 capsule by mouth once a week 12 capsule 3   estradiol (ESTRACE) 0.1 MG/GM vaginal cream      fluticasone (FLONASE) 50 MCG/ACT nasal spray Place 2 sprays into both nostrils daily. (Patient taking differently: Place 2 sprays into both nostrils daily as needed for allergies.) 16 g 6   nitrofurantoin, macrocrystal-monohydrate, (MACROBID) 100 MG capsule Take 1 capsule (100 mg total) by mouth 2 (two) times daily. 60 capsule 2   polyethylene glycol (MIRALAX / GLYCOLAX) 17 g packet Take 17 g by mouth daily. Use daily as needed for constipation     No facility-administered medications prior to visit.    Allergies  Allergen Reactions   Sulfa Antibiotics Nausea And Vomiting   Actemra [Tocilizumab]    Amlodipine    Methotrexate Derivatives Other (See Comments)    sweating   Niaspan [Niacin]     Severe flushing   Remicade [Infliximab] Nausea And Vomiting and Other (See Comments)    Affects nervous system   Welchol [Colesevelam] Other (See Comments)    Abdominal pain  and headache    Review of Systems  Constitutional:  Negative for appetite change, fatigue and fever.  HENT:  Negative for congestion, ear pain, sinus pressure and sore throat.   Eyes:  Negative for pain.  Respiratory:  Positive for cough, shortness of breath and wheezing. Negative for chest tightness.   Cardiovascular:  Negative for chest pain and palpitations.  Gastrointestinal:  Negative for abdominal pain, constipation, diarrhea, nausea and vomiting.  Genitourinary:  Negative for dysuria and hematuria.  Musculoskeletal:  Positive for joint swelling (Left knee  pain). Negative for arthralgias, back pain and myalgias.  Skin:  Negative for rash.  Neurological:  Negative for dizziness, weakness and headaches.  Psychiatric/Behavioral:  Negative for dysphoric mood. The patient is not nervous/anxious.       Objective:    Physical Exam Vitals reviewed.  Constitutional:      Appearance: Normal appearance. She is obese.     Comments: Pt is in wheelchair  Neck:     Vascular: No carotid bruit.  Cardiovascular:     Rate and Rhythm: Normal rate and regular rhythm.     Pulses: Normal pulses.     Heart sounds: Normal heart sounds.  Pulmonary:     Effort: Pulmonary effort is normal. No respiratory distress.     Breath sounds: Normal breath sounds.     Comments: With ambulation pt became severely stridorous. Pulse oximetry dropped to 80%. Pt improved with 2 L of oxygen when we had her stop breathing through her mouth. When she breathed through her nose her oxygen increased to 98%. She was able to maintain her oxygen above 90% with ambulation on 2L. Abdominal:     General: Abdomen is flat. Bowel sounds are normal.     Palpations: Abdomen is soft.     Tenderness: There is no abdominal tenderness.  Musculoskeletal:        General: Tenderness (left knee.) present.  Neurological:     Mental Status: She is alert and oriented to person, place, and time.  Psychiatric:        Mood and Affect: Mood  normal.        Behavior: Behavior normal.    BP 140/68 (BP Location: Right Arm, Patient Position: Sitting)    Pulse 94    Temp 97.8 F (36.6 C) (Temporal)    Ht 5' (1.524 m)    Wt 208 lb (94.3 kg)    SpO2 98%    BMI 40.62 kg/m  Wt Readings from Last 3 Encounters:  10/30/21 208 lb (94.3 kg)  10/10/21 208 lb (94.3 kg)  09/28/21 208 lb (94.3 kg)    Health Maintenance Due  Topic Date Due   OPHTHALMOLOGY EXAM  Never done   Zoster Vaccines- Shingrix (1 of 2) Never done   COVID-19 Vaccine (3 - Moderna risk series) 08/22/2021   HEMOGLOBIN A1C  09/30/2021   TETANUS/TDAP  09/30/2021    There are no preventive care reminders to display for this patient.   Lab Results  Component Value Date   TSH 2.080 03/30/2021   Lab Results  Component Value Date   WBC 6.6 09/28/2021   HGB 11.1 (L) 09/28/2021   HCT 34.1 (L) 09/28/2021   MCV 87.1 09/28/2021   PLT 167.0 09/28/2021   Lab Results  Component Value Date   NA 142 03/30/2021   K 4.4 03/30/2021   CO2 21 03/30/2021   GLUCOSE 90 03/30/2021   BUN 26 03/30/2021   CREATININE 1.22 (H) 03/30/2021   BILITOT 0.3 03/30/2021   ALKPHOS 76 03/30/2021   AST 27 03/30/2021   ALT 19 03/30/2021   PROT 6.9 03/30/2021   ALBUMIN 4.5 03/30/2021   CALCIUM 9.3 03/30/2021   EGFR 45 (L) 03/30/2021   Lab Results  Component Value Date   CHOL 169 03/30/2021   Lab Results  Component Value Date   HDL 77 03/30/2021   Lab Results  Component Value Date   LDLCALC 76 03/30/2021   Lab Results  Component Value Date   TRIG 86 03/30/2021   Lab  Results  Component Value Date   CHOLHDL 2.2 03/30/2021   Lab Results  Component Value Date   HGBA1C 6.0 (H) 03/30/2021         Assessment & Plan:   Problem List Items Addressed This Visit       Cardiovascular and Mediastinum   Essential hypertension - Primary     Respiratory   Chronic respiratory failure with hypoxia (HCC)     Endocrine   Dyslipidemia associated with type 2 diabetes  mellitus ([place])   Relevant Orders   CBC with Differential/Platelet (Completed)   Comprehensive metabolic panel (Completed)   Hemoglobin A1c (Completed)   Lipid panel (Completed)     Musculoskeletal and Integument   Localized osteoarthritis of left knee    Risks were discussed including bleeding, infection, increase in sugars if diabetic, atrophy at site of injection, and increased pain.  After consent was obtained, using sterile technique the knee was prepped with alcohol.  The joint was entered and Kenalog 80 mg and 5 ml plain Lidocaine was then injected and the needle withdrawn.  The procedure was well tolerated.   The patient is asked to continue to rest the joint for a few more days before resuming regular activities.  It may be more painful for the first 1-2 days.  Watch for fever, or increased swelling or persistent pain in the joint. Call or return to clinic prn if such symptoms occur or there is failure to improve as anticipated.        Other   Dyspnea on exertion   Stridor   Mild recurrent major depression ([place])     I,Lauren M Auman,acting as a scribe for [name], MD.,have documented all relevant documentation on the behalf of [name], MD,as directed by  [name], MD while in the presence of [name], MD.    Oleta Mouse, CMA

## 2021-10-30 ENCOUNTER — Other Ambulatory Visit: Payer: Self-pay

## 2021-10-30 ENCOUNTER — Encounter: Payer: Self-pay | Admitting: Family Medicine

## 2021-10-30 ENCOUNTER — Ambulatory Visit (INDEPENDENT_AMBULATORY_CARE_PROVIDER_SITE_OTHER): Payer: Medicare Other | Admitting: Family Medicine

## 2021-10-30 VITALS — BP 140/68 | HR 94 | Temp 97.8°F | Ht 60.0 in | Wt 208.0 lb

## 2021-10-30 DIAGNOSIS — E1169 Type 2 diabetes mellitus with other specified complication: Secondary | ICD-10-CM | POA: Diagnosis not present

## 2021-10-30 DIAGNOSIS — R061 Stridor: Secondary | ICD-10-CM | POA: Diagnosis not present

## 2021-10-30 DIAGNOSIS — R0609 Other forms of dyspnea: Secondary | ICD-10-CM

## 2021-10-30 DIAGNOSIS — I152 Hypertension secondary to endocrine disorders: Secondary | ICD-10-CM

## 2021-10-30 DIAGNOSIS — M1712 Unilateral primary osteoarthritis, left knee: Secondary | ICD-10-CM | POA: Diagnosis not present

## 2021-10-30 DIAGNOSIS — F330 Major depressive disorder, recurrent, mild: Secondary | ICD-10-CM

## 2021-10-30 DIAGNOSIS — E1159 Type 2 diabetes mellitus with other circulatory complications: Secondary | ICD-10-CM

## 2021-10-30 DIAGNOSIS — J9611 Chronic respiratory failure with hypoxia: Secondary | ICD-10-CM

## 2021-10-30 DIAGNOSIS — E785 Hyperlipidemia, unspecified: Secondary | ICD-10-CM

## 2021-10-30 NOTE — Assessment & Plan Note (Signed)
Risks were discussed including bleeding, infection, increase in sugars if diabetic, atrophy at site of injection, and increased pain.  After consent was obtained, using sterile technique the knee was prepped with alcohol.  The joint was entered and Kenalog 80 mg and 5 ml plain Lidocaine was then injected and the needle withdrawn.  The procedure was well tolerated.   The patient is asked to continue to rest the joint for a few more days before resuming regular activities.  It may be more painful for the first 1-2 days.  Watch for fever, or increased swelling or persistent pain in the joint. Call or return to clinic prn if such symptoms occur or there is failure to improve as anticipated. 

## 2021-10-31 LAB — CBC WITH DIFFERENTIAL/PLATELET
Basophils Absolute: 0.1 10*3/uL (ref 0.0–0.2)
Basos: 1 %
EOS (ABSOLUTE): 0.4 10*3/uL (ref 0.0–0.4)
Eos: 5 %
Hematocrit: 33.7 % — ABNORMAL LOW (ref 34.0–46.6)
Hemoglobin: 10.9 g/dL — ABNORMAL LOW (ref 11.1–15.9)
Immature Grans (Abs): 0.1 10*3/uL (ref 0.0–0.1)
Immature Granulocytes: 1 %
Lymphocytes Absolute: 1.2 10*3/uL (ref 0.7–3.1)
Lymphs: 16 %
MCH: 28.1 pg (ref 26.6–33.0)
MCHC: 32.3 g/dL (ref 31.5–35.7)
MCV: 87 fL (ref 79–97)
Monocytes Absolute: 0.7 10*3/uL (ref 0.1–0.9)
Monocytes: 9 %
Neutrophils Absolute: 5.3 10*3/uL (ref 1.4–7.0)
Neutrophils: 68 %
Platelets: 163 10*3/uL (ref 150–450)
RBC: 3.88 x10E6/uL (ref 3.77–5.28)
RDW: 14.8 % (ref 11.7–15.4)
WBC: 7.7 10*3/uL (ref 3.4–10.8)

## 2021-10-31 LAB — COMPREHENSIVE METABOLIC PANEL
ALT: 19 IU/L (ref 0–32)
AST: 25 IU/L (ref 0–40)
Albumin/Globulin Ratio: 2 (ref 1.2–2.2)
Albumin: 4.3 g/dL (ref 3.7–4.7)
Alkaline Phosphatase: 66 IU/L (ref 44–121)
BUN/Creatinine Ratio: 21 (ref 12–28)
BUN: 27 mg/dL (ref 8–27)
Bilirubin Total: 0.2 mg/dL (ref 0.0–1.2)
CO2: 20 mmol/L (ref 20–29)
Calcium: 9 mg/dL (ref 8.7–10.3)
Chloride: 109 mmol/L — ABNORMAL HIGH (ref 96–106)
Creatinine, Ser: 1.28 mg/dL — ABNORMAL HIGH (ref 0.57–1.00)
Globulin, Total: 2.1 g/dL (ref 1.5–4.5)
Glucose: 106 mg/dL — ABNORMAL HIGH (ref 70–99)
Potassium: 5.5 mmol/L — ABNORMAL HIGH (ref 3.5–5.2)
Sodium: 145 mmol/L — ABNORMAL HIGH (ref 134–144)
Total Protein: 6.4 g/dL (ref 6.0–8.5)
eGFR: 42 mL/min/{1.73_m2} — ABNORMAL LOW (ref >59)

## 2021-10-31 LAB — LIPID PANEL
Chol/HDL Ratio: 2.4 ratio (ref 0.0–4.4)
Cholesterol, Total: 184 mg/dL (ref 100–199)
HDL: 77 mg/dL (ref >39)
LDL Chol Calc (NIH): 89 mg/dL (ref 0–99)
Triglycerides: 101 mg/dL (ref 0–149)
VLDL Cholesterol Cal: 18 mg/dL (ref 5–40)

## 2021-10-31 LAB — HEMOGLOBIN A1C
Est. average glucose Bld gHb Est-mCnc: 134 mg/dL
Hgb A1c MFr Bld: 6.3 % — ABNORMAL HIGH (ref 4.8–5.6)

## 2021-11-01 ENCOUNTER — Other Ambulatory Visit: Payer: Self-pay

## 2021-11-01 ENCOUNTER — Other Ambulatory Visit: Payer: Medicare Other

## 2021-11-01 DIAGNOSIS — J9611 Chronic respiratory failure with hypoxia: Secondary | ICD-10-CM | POA: Insufficient documentation

## 2021-11-01 DIAGNOSIS — F330 Major depressive disorder, recurrent, mild: Secondary | ICD-10-CM | POA: Insufficient documentation

## 2021-11-01 DIAGNOSIS — R061 Stridor: Secondary | ICD-10-CM | POA: Insufficient documentation

## 2021-11-01 DIAGNOSIS — E875 Hyperkalemia: Secondary | ICD-10-CM

## 2021-11-05 NOTE — Assessment & Plan Note (Signed)
BP little high.  No changes to medicines.

## 2021-11-05 NOTE — Assessment & Plan Note (Signed)
Refer to ENT for nasopharyngoscopy as her "wheezing" is more stridor.

## 2021-11-05 NOTE — Assessment & Plan Note (Signed)
New diagnosis today. Occurs with ambulation. Needs oxygen 2 L. Likely needs at night also, but pt is a mouth breather and would likely benefit from a mask. Pt has history of OSA, but refuses to wear a cpap.  At the very least I would recommend pt wear oxygen at 2 L at night.  I have instructed her to use her husband's oxygen as he just was admitted to a SNF for rehab.

## 2021-11-05 NOTE — Assessment & Plan Note (Signed)
Control: good Recommend check feet daily. Recommend annual eye exams. Medicines: none Continue to work on eating a healthy diet. Unable to exercise.  Labs drawn today.    

## 2021-11-05 NOTE — Assessment & Plan Note (Signed)
Needs oxygen 2 L. Failed walk test with oxygen dropping to 80%. Improved with oxygen 2 L and then was able to maintain oxygen level with ambulation while wearing oxygen.

## 2021-11-05 NOTE — Assessment & Plan Note (Signed)
The current medical regimen is effective;  continue present plan and medications.  

## 2021-11-06 ENCOUNTER — Other Ambulatory Visit: Payer: Medicare Other

## 2021-11-06 ENCOUNTER — Other Ambulatory Visit: Payer: Self-pay | Admitting: Family Medicine

## 2021-11-06 ENCOUNTER — Other Ambulatory Visit: Payer: Self-pay

## 2021-11-06 MED ORDER — HYDROCODONE-ACETAMINOPHEN 5-325 MG PO TABS: 1.0000 | ORAL_TABLET | Freq: Four times a day (QID) | ORAL | 0 refills | Status: DC | PRN

## 2021-11-08 ENCOUNTER — Other Ambulatory Visit: Payer: Medicare Other

## 2021-11-13 DIAGNOSIS — Z79899 Other long term (current) drug therapy: Secondary | ICD-10-CM | POA: Diagnosis not present

## 2021-11-13 DIAGNOSIS — R5383 Other fatigue: Secondary | ICD-10-CM | POA: Diagnosis not present

## 2021-11-13 DIAGNOSIS — E669 Obesity, unspecified: Secondary | ICD-10-CM | POA: Diagnosis not present

## 2021-11-13 DIAGNOSIS — J849 Interstitial pulmonary disease, unspecified: Secondary | ICD-10-CM | POA: Diagnosis not present

## 2021-11-13 DIAGNOSIS — M150 Primary generalized (osteo)arthritis: Secondary | ICD-10-CM | POA: Diagnosis not present

## 2021-11-13 DIAGNOSIS — Z13220 Encounter for screening for lipoid disorders: Secondary | ICD-10-CM | POA: Diagnosis not present

## 2021-11-13 DIAGNOSIS — Z6837 Body mass index (BMI) 37.0-37.9, adult: Secondary | ICD-10-CM | POA: Diagnosis not present

## 2021-11-13 DIAGNOSIS — M48061 Spinal stenosis, lumbar region without neurogenic claudication: Secondary | ICD-10-CM | POA: Diagnosis not present

## 2021-11-13 DIAGNOSIS — M0579 Rheumatoid arthritis with rheumatoid factor of multiple sites without organ or systems involvement: Secondary | ICD-10-CM | POA: Diagnosis not present

## 2021-11-28 ENCOUNTER — Telehealth: Payer: Self-pay

## 2021-11-28 NOTE — Chronic Care Management (AMB) (Signed)
Chronic Care Management Pharmacy Assistant   Name: Sherry Burke  MRN: [id number] DOB: 1941-01-21   Reason for Encounter: Disease State call for COPD     Recent office visits:  10/30/21 [name] MD. Seen for HTN and DM. Referral to ENT and DME. Completed course of Estradiol 0.1 mg, Flonase nasal spray, Macrobid 100 mg and Glycolax 17g. No other med changes.   10/10/21 [name] MD. Seen UTI and Knee Pain. Started on Ciprofloxacin HCI 250 mg 2 times daily.   Recent consult visits:  None  Hospital visits:  None  Medications: Outpatient Encounter Medications as of 11/28/2021  Medication Sig   albuterol (VENTOLIN HFA) 108 (90 Base) MCG/ACT inhaler Inhale 2 puffs into the lungs every 6 (six) hours as needed for wheezing or shortness of breath.   atorvastatin (LIPITOR) 40 MG tablet Take 1 tablet by mouth once daily   Budeson-Glycopyrrol-Formoterol (BREZTRI AEROSPHERE) 160-9-4.8 MCG/ACT AERO Inhale 2 puffs into the lungs in the morning and at bedtime.   cloNIDine (CATAPRES) 0.1 MG tablet Take 1 tablet by mouth twice daily   cyanocobalamin 2000 MCG tablet Take 2,000 mcg by mouth daily.   diphenhydramine-acetaminophen (TYLENOL PM) 25-500 MG TABS tablet Take 1 tablet by mouth at bedtime as needed.   ferrous sulfate 325 (65 FE) MG tablet Take 325 mg by mouth daily with breakfast.   gabapentin (NEURONTIN) 300 MG capsule Take 1 capsule by mouth twice daily   HYDROcodone-acetaminophen (NORCO/VICODIN) 5-325 MG tablet Take 1 tablet by mouth every 6 (six) hours as needed for moderate pain.   hydroxychloroquine (PLAQUENIL) 200 MG tablet Take 1 tablet (200 mg total) by mouth 2 (two) times daily.   lisinopril (ZESTRIL) 10 MG tablet Take 1 tablet by mouth once daily   montelukast (SINGULAIR) 10 MG tablet Take 1 tablet (10 mg total) by mouth at bedtime.   omeprazole (PRILOSEC) 40 MG capsule Take 1 capsule by mouth twice daily   ONETOUCH ULTRA test strip USE TO CHECK BLOOD SUGAR ONCE DAILY IN  THE MORNING   PARoxetine (PAXIL-CR) 25 MG 24 hr tablet Take 1 tablet by mouth once daily   rOPINIRole (REQUIP) 1 MG tablet TAKE 1 TABLET EVERY MORNINGAND 2 TABLETS AT BEDTIME   Tofacitinib Citrate (XELJANZ) 5 MG TABS Take 5 mg by mouth 2 (two) times daily. Take twice a day.   Vitamin D, Ergocalciferol, (DRISDOL) 1.25 MG (50000 UNIT) CAPS capsule Take 1 capsule by mouth once a week   No facility-administered encounter medications on file as of 11/28/2021.     Current COPD regimen:  Breztri Inhaler 2 puffs in the morning and evening. Pt is not taking due to being out Albuterol 108 Inhaler 2 puffs q6h prn wheezing or SOB.Marland Kitchen  Any recent hospitalizations or ED visits since last visit with CPP? No  reports the following COPD symptoms, including Symptoms worse at night   What recent interventions/DTPs have been made by any provider to improve breathing since last visit: An allergy specialist put her on Breztri and Singulair and she stated the [facility] did not help her. She stated the Singulair caused her an upset stomach. She feels that it did help her get up her flem and may want a refill on this.    Have you had exacerbation/flare-up since last visit? Yes, she has to use her rescue inhaler daily and anytime she stretches she stated it gets worse and feels pressure on her chest.   What do you do when you are short of  breath?  Rest  Current tobacco use? No  Respiratory Devices/Equipment Do you have a nebulizer? Yes, Albuterol nebulizer solution 0.042% BID  Do you use a Peak Flow Meter? Yes up to 15  Do you use a maintenance inhaler? No How often do you forget to use your daily inhaler? Pt stated she does not have one  Do you use a rescue inhaler? Yes How often do you use your rescue inhaler?  daily Do you use a spacer with your inhaler? No  Adherence Review: Does the patient have >5 day gap between last estimated fill date for maintenance inhaler medications? CPP to review Albuterol  inhaler- 50 DS last filled 09/07/20. Pt stated she has several laying around and has 75 mcg in each.   Care Gaps: Last annual wellness visit?None noted   Elray Mcgregor, [place] Pharmacist Assistant  [phone]

## 2021-11-29 NOTE — Progress Notes (Signed)
Error

## 2021-11-30 ENCOUNTER — Other Ambulatory Visit: Payer: Self-pay | Admitting: Family Medicine

## 2021-12-04 ENCOUNTER — Ambulatory Visit (INDEPENDENT_AMBULATORY_CARE_PROVIDER_SITE_OTHER): Payer: Medicare Other

## 2021-12-04 ENCOUNTER — Ambulatory Visit (INDEPENDENT_AMBULATORY_CARE_PROVIDER_SITE_OTHER): Payer: Medicare Other | Admitting: Pulmonary Disease

## 2021-12-04 ENCOUNTER — Other Ambulatory Visit: Payer: Self-pay

## 2021-12-04 ENCOUNTER — Encounter: Payer: Self-pay | Admitting: Pulmonary Disease

## 2021-12-04 VITALS — BP 150/70 | HR 94 | Temp 97.8°F | Ht 60.0 in | Wt 206.0 lb

## 2021-12-04 DIAGNOSIS — J849 Interstitial pulmonary disease, unspecified: Secondary | ICD-10-CM

## 2021-12-04 DIAGNOSIS — F330 Major depressive disorder, recurrent, mild: Secondary | ICD-10-CM

## 2021-12-04 DIAGNOSIS — M05711 Rheumatoid arthritis with rheumatoid factor of right shoulder without organ or systems involvement: Secondary | ICD-10-CM

## 2021-12-04 DIAGNOSIS — I10 Essential (primary) hypertension: Secondary | ICD-10-CM

## 2021-12-04 DIAGNOSIS — J383 Other diseases of vocal cords: Secondary | ICD-10-CM

## 2021-12-04 DIAGNOSIS — E1159 Type 2 diabetes mellitus with other circulatory complications: Secondary | ICD-10-CM

## 2021-12-04 DIAGNOSIS — M05712 Rheumatoid arthritis with rheumatoid factor of left shoulder without organ or systems involvement: Secondary | ICD-10-CM | POA: Diagnosis not present

## 2021-12-04 NOTE — Patient Instructions (Signed)
Visit Information   Goals Addressed   None    Patient Care Plan: CCM Pharmacy Care Plan     Problem Identified: dm, htn, hld   Priority: High  Onset Date: 01/10/2021     Long-Range Goal: Disease Management   Start Date: 01/10/2021  Expected End Date: 01/10/2022  Recent Progress: On track  Priority: High  Note:      Current Barriers:  Unable to optimize arthritis regimen due to side effects.    Pharmacist Clinical Goal(s):  Over the next 90 days, patient will adhere to prescribed medication regimen as evidenced by fill history through collaboration with PharmD and provider.   Interventions: 1:1 collaboration with [facility], Elnita Maxwell, MD regarding development and update of comprehensive plan of care as evidenced by provider attestation and co-signature Inter-disciplinary care team collaboration (see longitudinal plan of care) Comprehensive medication review performed; medication list updated in electronic medical record  Hypertension (BP goal <130/80) BP Readings from Last 3 Encounters:  12/04/21 (!) 150/70  10/30/21 140/68  10/10/21 (!) 144/68  -Not Controlled -Current treatment: lisinopril 10 mg daily Appropriate, Query effective, Safe, Accessible Clonidine 0.1 mg bid Appropriate, Query effective, Safe, Accessible -Medications previously tried: amlodipine, furosemide, spironolactone -Current home readings:   -Jan 2023: Patient states commonly in 132'G systolic -Current dietary habits: wathcing salt in diet  -Current exercise habits: minimal due to back and knee pain  -Denies hypotensive/hypertensive symptoms -Educated on BP goals and benefits of medications for prevention of heart attack, stroke and kidney damage; Daily salt intake goal < 2300 mg; Exercise goal of 150 minutes per week; Importance of home blood pressure monitoring; Proper BP monitoring technique; -Counseled to monitor BP at home weekly, document, and provide log at future appointments -Counseled on diet and  exercise extensively Recommended to continue current medication Jan 2023: BP elevated, will let PCP know  Hyperlipidemia: (LDL goal < 100) The ASCVD Risk score (Arnett DK, et al., 2019) failed to calculate for the following reasons:   The 2019 ASCVD risk score is only valid for ages 51 to 40 Lab Results  Component Value Date   CHOL 184 10/30/2021   CHOL 169 03/30/2021   CHOL 163 12/15/2020   Lab Results  Component Value Date   HDL 77 10/30/2021   HDL 77 03/30/2021   HDL 66 12/15/2020   Lab Results  Component Value Date   LDLCALC 89 10/30/2021   LDLCALC 76 03/30/2021   LDLCALC 78 12/15/2020   Lab Results  Component Value Date   TRIG 101 10/30/2021   TRIG 86 03/30/2021   TRIG 103 12/15/2020   Lab Results  Component Value Date   CHOLHDL 2.4 10/30/2021   CHOLHDL 2.2 03/30/2021   CHOLHDL 2.5 12/15/2020  No results found for: LDLDIRECT -Not ideally controlled -Current treatment: atorvastatin 40 mg daily Appropriate, Effective, Safe, Accessible -Medications previously tried: none reported  -Current dietary patterns: vegetables, wheat bread, homemade soup -Current exercise habits: minimal due to knee/back pain -Educated on Cholesterol goals;  Benefits of statin for ASCVD risk reduction; Importance of limiting foods high in cholesterol; -Counseled on diet and exercise extensively Recommended to continue current medication  Diabetes (A1c goal <7%) Lab Results  Component Value Date   HGBA1C 6.3 (H) 10/30/2021   HGBA1C 6.0 (H) 03/30/2021   HGBA1C 6.0 (H) 12/15/2020   Lab Results  Component Value Date   LDLCALC 89 10/30/2021   CREATININE 1.28 (H) 10/30/2021   Lab Results  Component Value Date   NA 145 (H) 10/30/2021  K 5.5 (H) 10/30/2021   CREATININE 1.28 (H) 10/30/2021   EGFR 42 (L) 10/30/2021   GFRNONAA 40 (L) 01/01/2021   GLUCOSE 106 (H) 10/30/2021   Lab Results  Component Value Date   WBC 7.7 10/30/2021   HGB 10.9 (L) 10/30/2021   HCT 33.7 (L)  10/30/2021   MCV 87 10/30/2021   PLT 163 10/30/2021  -Controlled -Current medications: Diet and lifestyle -Medications previously tried: n/a  -Denies hypoglycemic/hyperglycemic symptoms -Current meal patterns:  breakfast: eggs and toast  lunch: vegetables  dinner: sandwich -Current exercise: minimal due to knee/back pain  -Educated onA1c and blood sugar goals; Complications of diabetes including kidney damage, retinal damage, and cardiovascular disease; Carbohydrate counting and/or plate method -Counseled to check feet daily and get yearly eye exams -Counseled on diet and exercise extensively   Depression/Anxiety (Goal: manage symptoms of depression) -Controlled -Current treatment:  Paroxetine CR 25 mg daily Appropriate, Effective, Safe, Accessible -Medications previously tried/failed: none reported -PHQ9:  Depression screen [facility] 2/9 12/04/2021 03/30/2021 01/08/2021  Decreased Interest 0 0 0  Down, Depressed, Hopeless 1 0 0  PHQ - 2 Score 1 0 0  Difficult doing work/chores - - -  -Educated on Benefits of medication for symptom control -Recommended to continue current medication  Osteoporosis / Osteopenia (Goal reduce risk of fracture ) -Controlled -Last DEXA Scan:  ordered updated scan 12/2020. 2019 scan showed osteopenia -Patient's updated Dexa scan is ordered.  -Current treatment  Vitamin D 50,000 units weekly Appropriate, Effective, Safe, Accessible -Medications previously tried: none reported  -Recommend [id number] units of vitamin D daily. Recommend 1200 mg of calcium daily from dietary and supplemental sources. Recommend weight-bearing and muscle strengthening exercises for building and maintaining bone density. -Counseled on diet and exercise extensively Jan 2023: DEXA ordered for 12/2020 but unable to find results, will ask team if was ordered  Rheumatoid Arthritis  (Goal: minimize joint pain/swelling) -Controlled -Pain Scale Today's Vitals   12/04/21 1400  PainSc:  7   Aggravating Factors: Chronic Pain  Pain Type: Chronic  Pain with Medications: 3/10 -Current treatment  Hydrocodone-acetaminophen 5-325 mg every 6 hours prn moderate pain Appropriate, Effective, Safe, Accessible Xeljanz 5 mg daily Appropriate, Effective, Safe, Accessible Hydroxychloroquine 200 mg bid Appropriate, Effective, Safe, Accessible -Medications previously tried:  methotrexate, leflunomide, nabumetone -Recommended to continue current medication Patient reports that she is stable on current regimen.    Patient Goals/Self-Care Activities Over the next 90 days, patient will:  - take medications as prescribed focus on medication adherence by using pill box engage in dietary modifications by reduced sodium.   Follow Up Plan: Telephone follow up appointment with care management team member scheduled for: March 2023  [place] Constable, [place].D. - [phone]      Ms. Kliewer was given information about Chronic Care Management services today including:  CCM service includes personalized support from designated clinical staff supervised by her physician, including individualized plan of care and coordination with other care providers 24/7 contact phone numbers for assistance for urgent and routine care needs. Standard insurance, coinsurance, copays and deductibles apply for chronic care management only during months in which we provide at least 20 minutes of these services. Most insurances cover these services at 100%, however patients may be responsible for any copay, coinsurance and/or deductible if applicable. This service may help you avoid the need for more expensive face-to-face services. Only one practitioner may furnish and bill the service in a calendar month. The patient may stop CCM services at any time (effective at  the end of the month) by phone call to the office staff.  Patient agreed to services and verbal consent obtained.   The patient verbalized understanding of  instructions, educational materials, and care plan provided today and declined offer to receive copy of patient instructions, educational materials, and care plan.  The pharmacy team will reach out to the patient again over the next 60 days.   Lane Hacker, [place]

## 2021-12-04 NOTE — Progress Notes (Signed)
Chronic Care Management Pharmacy Note  12/04/2021 Name:  Sherry Burke MRN:  [id number] DOB:  02/19/1941   Summary:  Pleasant 81 year old woman presents for f/u visit. She is wheelchair bound and in pain from her RA, but she enjoys her time watching movies, specifically ones about families moving from the city and finding joy in the country (Like "Lassie"). She was born in [place], then moved to [place] at 17. 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 They lived in [place] for 17 years while husband was in the service (Anniversary October 4th). They have 3 children, 1 daughter 42, and twin boys of 59. She loves eating Little Debbie cakes, especially the strawberry ones.   Plan Recommendations:  BP Elevated, states normal for her is 737 systolic. Will ask PCP about increasing therapy Unknown when last DEXA was, coordinated with Meredith Mody to determine and if past 2 years, will get scheduled  Subjective: Sherry Burke is an 81 y.o. year old female who is a primary patient of Cox, Kirsten, MD.  The CCM team was consulted for assistance with disease management and care coordination needs.    Engaged with patient by telephone for follow up visit in response to provider referral for pharmacy case management and/or care coordination services.   Consent to Services:  The patient was given information about Chronic Care Management services, agreed to services, and gave verbal consent prior to initiation of services.  Please see initial visit note for detailed documentation.   Patient Care Team: [name], MD as PCP - General (Family Medicine) Hennie Duos, MD as Consulting Physician (Rheumatology) Lane Hacker, [facility] (Pharmacist)  Recent office visits:  10/30/21 [name] MD. Seen for HTN and DM. Referral to ENT and DME. Completed course of Estradiol 0.1 mg, Flonase nasal spray, Macrobid 100 mg and Glycolax 17g. No other med changes.     10/10/21 [name] MD. Seen UTI and Knee Pain. Started on Ciprofloxacin HCI 250 mg 2 times daily.    Recent consult visits:  None   Hospital visits:  None  Objective:  Lab Results  Component Value Date   CREATININE 1.28 (H) 10/30/2021   BUN 27 10/30/2021   GFRNONAA 40 (L) 01/01/2021   GFRAA 46 (L) 01/01/2021   NA 145 (H) 10/30/2021   K 5.5 (H) 10/30/2021   CALCIUM 9.0 10/30/2021   CO2 20 10/30/2021    Lab Results  Component Value Date/Time   HGBA1C 6.3 (H) 10/30/2021 11:05 AM   HGBA1C 6.0 (H) 03/30/2021 11:29 AM    Last diabetic Eye exam: No results found for: HMDIABEYEEXA  Last diabetic Foot exam: No results found for: HMDIABFOOTEX   Lab Results  Component Value Date   CHOL 184 10/30/2021   HDL 77 10/30/2021   LDLCALC 89 10/30/2021   TRIG 101 10/30/2021   CHOLHDL 2.4 10/30/2021    Hepatic Function Latest Ref Rng & Units 10/30/2021 03/30/2021 02/05/2021  Total Protein 6.0 - 8.5 g/dL 6.4 6.9 6.7  Albumin 3.7 - 4.7 g/dL 4.3 4.5 4.2  AST 0 - 40 IU/L _0 ALT 0 - 32 IU/L _1 Alk Phosphatase 44 - 121 IU/L 66 76 86  Total Bilirubin 0.0 - 1.2 mg/dL 0.2 0.3 0.3    Lab Results  Component Value Date/Time   TSH 2.080 03/30/2021 11:29 AM    CBC Latest Ref Rng & Units 10/30/2021 09/28/2021 03/30/2021  WBC  3.4 - 10.8 x10E3/uL 7.7 6.6 7.3  Hemoglobin 11.1 - 15.9 g/dL 10.9(L) 11.1(L) 10.9(L)  Hematocrit 34.0 - 46.6 % 33.7(L) 34.1(L) 33.2(L)  Platelets 150 - 450 x10E3/uL 163 167.0 186    No results found for: VD25OH  Clinical ASCVD: No  The ASCVD Risk score (Arnett DK, et al., 2019) failed to calculate for the following reasons:   The 2019 ASCVD risk score is only valid for ages 2 to 37    Depression screen PHQ 2/9 12/04/2021 03/30/2021 01/08/2021  Decreased Interest 0 0 0  Down, Depressed, Hopeless 1 0 0  PHQ - 2 Score 1 0 0  Difficult doing work/chores - - -     Social History   Tobacco Use  Smoking Status Never   Passive exposure: Yes   Smokeless Tobacco Never   BP Readings from Last 3 Encounters:  12/04/21 (!) 150/70  10/30/21 140/68  10/10/21 (!) 144/68   Pulse Readings from Last 3 Encounters:  12/04/21 94  10/30/21 94  10/10/21 (!) 103   Wt Readings from Last 3 Encounters:  12/04/21 206 lb (93.4 kg)  10/30/21 208 lb (94.3 kg)  10/10/21 208 lb (94.3 kg)    Assessment/Interventions: Review of patient past medical history, allergies, medications, health status, including review of consultants reports, laboratory and other test data, was performed as part of comprehensive evaluation and provision of chronic care management services.   SDOH:  (Social Determinants of Health) assessments and interventions performed: Yes SDOH Interventions    Flowsheet Row Most Recent Value  SDOH Interventions   Financial Strain Interventions Intervention Not Indicated  Physical Activity Interventions Other (Comments)  [In a wheelchair and patient is out of breath with minimal movement]       CCM Care Plan  Allergies  Allergen Reactions   Sulfa Antibiotics Nausea And Vomiting   Actemra [Tocilizumab]    Amlodipine    Methotrexate Derivatives Other (See Comments)    sweating   Niaspan [Niacin]     Severe flushing   Remicade [Infliximab] Nausea And Vomiting and Other (See Comments)    Affects nervous system   Welchol [Colesevelam] Other (See Comments)    Abdominal pain and headache    Medications Reviewed Today     Reviewed by Lane Hacker, [facility] (Pharmacist) on 12/04/21 at 1359  Med List Status: <None>   Medication Order Taking? Sig Documenting Provider Last Dose Status Informant  albuterol (VENTOLIN HFA) 108 (90 Base) MCG/ACT inhaler [id number] No Inhale 2 puffs into the lungs every 6 (six) hours as needed for wheezing or shortness of breath. Julian Hy, DO Taking Active   atorvastatin (LIPITOR) 40 MG tablet [id number] No Take 1 tablet by mouth once daily Cox, Kirsten, MD Taking Active    Budeson-Glycopyrrol-Formoterol (BREZTRI AEROSPHERE) 160-9-4.8 MCG/ACT AERO [id number] No Inhale 2 puffs into the lungs in the morning and at bedtime. Martyn Ehrich, NP Taking Active   cloNIDine (CATAPRES) 0.1 MG tablet [id number] No Take 1 tablet by mouth twice daily Cox, Kirsten, MD Taking Active   cyanocobalamin 2000 MCG tablet [id number] No Take 2,000 mcg by mouth daily. [provider] Taking Active   diphenhydramine-acetaminophen (TYLENOL PM) 25-500 MG TABS tablet [id number] No Take 1 tablet by mouth at bedtime as needed. [provider] Taking Active   ferrous sulfate 325 (65 FE) MG tablet [id number] No Take 325 mg by mouth daily with breakfast. [provider] Taking Active   gabapentin (NEURONTIN) 300 MG capsule [id number] No Take 1 capsule  by mouth twice daily Cox, Kirsten, MD Taking Active   HYDROcodone-acetaminophen (NORCO/VICODIN) 5-325 MG tablet [id number] No Take 1 tablet by mouth every 6 (six) hours as needed for moderate pain. Cox, Kirsten, MD Taking Active   hydroxychloroquine (PLAQUENIL) 200 MG tablet [id number] No Take 1 tablet (200 mg total) by mouth 2 (two) times daily. Cox, Kirsten, MD Taking Active   lisinopril (ZESTRIL) 10 MG tablet [id number] No Take 1 tablet by mouth once daily Marianne Sofia, PA-C Taking Active   montelukast (SINGULAIR) 10 MG tablet [id number] No Take 1 tablet (10 mg total) by mouth at bedtime. Chilton Greathouse, MD Taking Active   omeprazole (PRILOSEC) 40 MG capsule [id number] No Take 1 capsule by mouth twice daily Cox, Kirsten, MD Taking Active   [facility] ULTRA test strip [id number] No USE TO CHECK BLOOD SUGAR ONCE DAILY IN THE MORNING [provider] Taking Active   PARoxetine (PAXIL-CR) 25 MG 24 hr tablet [id number] No Take 1 tablet by mouth once daily Cox, Kirsten, MD Taking Active   rOPINIRole (REQUIP) 1 MG tablet [id number] No TAKE 1 TABLET EVERY MORNINGAND 2 TABLETS AT BEDTIME Marianne Sofia, PA-C Taking Active   Tofacitinib  Citrate (XELJANZ) 5 MG TABS [id number] No Take 5 mg by mouth 2 (two) times daily. Take twice a day. [provider] Taking Active   Vitamin D, Ergocalciferol, (DRISDOL) 1.25 MG (50000 UNIT) CAPS capsule [id number] No Take 1 capsule by mouth once a week Blane Ohara, MD Taking Active             Patient Active Problem List   Diagnosis Date Noted   Stridor 11/01/2021   Chronic respiratory failure with hypoxia (HCC) 11/01/2021   Mild recurrent major depression (HCC) 11/01/2021   Localized osteoarthritis of left knee 10/30/2021   Moderate persistent asthma 10/13/2021   Primary osteoarthritis of right knee 10/13/2021   Healthcare maintenance 08/01/2020   ILD (interstitial lung disease) (HCC) 03/11/2020   Acute cystitis without hematuria 03/05/2020   Class 2 severe obesity due to excess calories with serious comorbidity and body mass index (BMI) of 37.0 to 37.9 in adult (HCC) 03/05/2020   Dermatitis 03/05/2020   GERD without esophagitis 02/25/2020   Dyslipidemia associated with type 2 diabetes mellitus (HCC) 02/25/2020   Mixed hyperlipidemia 02/25/2020   Hypertension associated with diabetes (HCC) 02/25/2020   Moderate recurrent major depression (HCC) 02/25/2020   Bilateral carpal tunnel syndrome 06/10/2019   Bilateral hand pain 06/10/2019   Neck pain 08/19/2016   Other spondylosis with radiculopathy, cervical region 08/19/2016   Rheumatoid arthritis involving both shoulders with positive rheumatoid factor (HCC) 08/19/2016   OSA (obstructive sleep apnea) 04/22/2012   RLS (restless legs syndrome) 04/22/2012   COPD (chronic obstructive pulmonary disease) (HCC) 03/18/2011   Dyspnea on exertion 03/18/2011    Immunization History  Administered Date(s) Administered   Fluad Quad(high Dose 65+) 08/22/2021   Influenza Whole 08/12/2011   Influenza, High Dose Seasonal PF 08/12/2019, 08/01/2020   Moderna Covid-19 Vaccine Bivalent Booster 57yrs & up 08/22/2021   Moderna SARS-COV2  Booster Vaccination 09/12/2020, 03/20/2021   Moderna Sars-Covid-2 Vaccination 11/26/2019, 12/24/2019   Pneumococcal Conjugate-13 09/08/2014   Pneumococcal Polysaccharide-23 10/05/2014   Tdap 10/01/2011    Conditions to be addressed/monitored:  Hypertension, Hyperlipidemia, Diabetes, COPD, Depression and Rheumatoid arthritis  Care Plan : CCM Pharmacy Care Plan  Updates made by Zettie Pho, RPH since 12/04/2021 12:00 AM     Problem: dm, htn, hld   Priority: High  Onset Date: 01/10/2021  Long-Range Goal: Disease Management   Start Date: 01/10/2021  Expected End Date: 01/10/2022  Recent Progress: On track  Priority: High  Note:      Current Barriers:  Unable to optimize arthritis regimen due to side effects.    Pharmacist Clinical Goal(s):  Over the next 90 days, patient will adhere to prescribed medication regimen as evidenced by fill history through collaboration with PharmD and provider.   Interventions: 1:1 collaboration with Cox, Elnita Maxwell, MD regarding development and update of comprehensive plan of care as evidenced by provider attestation and co-signature Inter-disciplinary care team collaboration (see longitudinal plan of care) Comprehensive medication review performed; medication list updated in electronic medical record  Hypertension (BP goal <130/80) BP Readings from Last 3 Encounters:  12/04/21 (!) 150/70  10/30/21 140/68  10/10/21 (!) 144/68  -Not Controlled -Current treatment: lisinopril 10 mg daily Appropriate, Query effective, Safe, Accessible Clonidine 0.1 mg bid Appropriate, Query effective, Safe, Accessible -Medications previously tried: amlodipine, furosemide, spironolactone -Current home readings:   -Jan 2023: Patient states commonly in 097'D systolic -Current dietary habits: wathcing salt in diet  -Current exercise habits: minimal due to back and knee pain  -Denies hypotensive/hypertensive symptoms -Educated on BP goals and benefits of  medications for prevention of heart attack, stroke and kidney damage; Daily salt intake goal < 2300 mg; Exercise goal of 150 minutes per week; Importance of home blood pressure monitoring; Proper BP monitoring technique; -Counseled to monitor BP at home weekly, document, and provide log at future appointments -Counseled on diet and exercise extensively Recommended to continue current medication Jan 2023: BP elevated, will let PCP know  Hyperlipidemia: (LDL goal < 100) The ASCVD Risk score (Arnett DK, et al., 2019) failed to calculate for the following reasons:   The 2019 ASCVD risk score is only valid for ages 13 to 32 Lab Results  Component Value Date   CHOL 184 10/30/2021   CHOL 169 03/30/2021   CHOL 163 12/15/2020   Lab Results  Component Value Date   HDL 77 10/30/2021   HDL 77 03/30/2021   HDL 66 12/15/2020   Lab Results  Component Value Date   LDLCALC 89 10/30/2021   LDLCALC 76 03/30/2021   LDLCALC 78 12/15/2020   Lab Results  Component Value Date   TRIG 101 10/30/2021   TRIG 86 03/30/2021   TRIG 103 12/15/2020   Lab Results  Component Value Date   CHOLHDL 2.4 10/30/2021   CHOLHDL 2.2 03/30/2021   CHOLHDL 2.5 12/15/2020  No results found for: LDLDIRECT -Not ideally controlled -Current treatment: atorvastatin 40 mg daily Appropriate, Effective, Safe, Accessible -Medications previously tried: none reported  -Current dietary patterns: vegetables, wheat bread, homemade soup -Current exercise habits: minimal due to knee/back pain -Educated on Cholesterol goals;  Benefits of statin for ASCVD risk reduction; Importance of limiting foods high in cholesterol; -Counseled on diet and exercise extensively Recommended to continue current medication  Diabetes (A1c goal <7%) Lab Results  Component Value Date   HGBA1C 6.3 (H) 10/30/2021   HGBA1C 6.0 (H) 03/30/2021   HGBA1C 6.0 (H) 12/15/2020   Lab Results  Component Value Date   LDLCALC 89 10/30/2021   CREATININE  1.28 (H) 10/30/2021   Lab Results  Component Value Date   NA 145 (H) 10/30/2021   K 5.5 (H) 10/30/2021   CREATININE 1.28 (H) 10/30/2021   EGFR 42 (L) 10/30/2021   GFRNONAA 40 (L) 01/01/2021   GLUCOSE 106 (H) 10/30/2021   Lab Results  Component Value Date  WBC 7.7 10/30/2021   HGB 10.9 (L) 10/30/2021   HCT 33.7 (L) 10/30/2021   MCV 87 10/30/2021   PLT 163 10/30/2021  -Controlled -Current medications: Diet and lifestyle -Medications previously tried: n/a  -Denies hypoglycemic/hyperglycemic symptoms -Current meal patterns:  breakfast: eggs and toast  lunch: vegetables  dinner: sandwich -Current exercise: minimal due to knee/back pain  -Educated onA1c and blood sugar goals; Complications of diabetes including kidney damage, retinal damage, and cardiovascular disease; Carbohydrate counting and/or plate method -Counseled to check feet daily and get yearly eye exams -Counseled on diet and exercise extensively   Depression/Anxiety (Goal: manage symptoms of depression) -Controlled -Current treatment:  Paroxetine CR 25 mg daily Appropriate, Effective, Safe, Accessible -Medications previously tried/failed: none reported -PHQ9:  Depression screen [facility] 2/9 12/04/2021 03/30/2021 01/08/2021  Decreased Interest 0 0 0  Down, Depressed, Hopeless 1 0 0  PHQ - 2 Score 1 0 0  Difficult doing work/chores - - -  -Educated on Benefits of medication for symptom control -Recommended to continue current medication  Osteoporosis / Osteopenia (Goal reduce risk of fracture ) -Controlled -Last DEXA Scan:  ordered updated scan 12/2020. 2019 scan showed osteopenia -Patient's updated Dexa scan is ordered.  -Current treatment  Vitamin D 50,000 units weekly Appropriate, Effective, Safe, Accessible -Medications previously tried: none reported  -Recommend [phone] units of vitamin D daily. Recommend 1200 mg of calcium daily from dietary and supplemental sources. Recommend weight-bearing and muscle  strengthening exercises for building and maintaining bone density. -Counseled on diet and exercise extensively Jan 2023: DEXA ordered for 12/2020 but unable to find results, will ask team if was ordered  Rheumatoid Arthritis  (Goal: minimize joint pain/swelling) -Controlled -Pain Scale Today's Vitals   12/04/21 1400  PainSc: 7   Aggravating Factors: Chronic Pain  Pain Type: Chronic  Pain with Medications: 3/10 -Current treatment  Hydrocodone-acetaminophen 5-325 mg every 6 hours prn moderate pain Appropriate, Effective, Safe, Accessible Xeljanz 5 mg daily Appropriate, Effective, Safe, Accessible Hydroxychloroquine 200 mg bid Appropriate, Effective, Safe, Accessible -Medications previously tried:  methotrexate, leflunomide, nabumetone -Recommended to continue current medication Patient reports that she is stable on current regimen.    Patient Goals/Self-Care Activities Over the next 90 days, patient will:  - take medications as prescribed focus on medication adherence by using pill box engage in dietary modifications by reduced sodium.   Follow Up Plan: Telephone follow up appointment with care management team member scheduled for: March 2023  [place] Constable, [place].D. - [phone]       Medication Assistance: None required.  Patient affirms current coverage meets needs.  Patient's preferred pharmacy is:  [facility] [address] [place] [address] [place] [address] Phone: [phone] Fax: [phone]  [facility] [place] [facility] [address] [place] [address] Phone: [phone] Fax: [phone]  Uses pill box? Yes Pt endorses good compliance  We discussed: Current pharmacy is preferred with insurance plan and patient is satisfied with pharmacy services Patient decided to: Continue current medication management  strategy  Care Plan and Follow Up Patient Decision:  Patient agrees to Care Plan and Follow-up.  Plan: Telephone follow up appointment with care management team member scheduled for:  03/23  [place] Constable, Pharm.D. - [phone]

## 2021-12-04 NOTE — Progress Notes (Signed)
Sherry Burke    [id number]    [birth year]  Primary Care Physician:Cox, Elnita Maxwell, MD  Referring Physician: [name], MD [address] [place] [address] [place],   [id number]  Chief complaint: Follow up for RA ILD, asthma, dyspnea  HPI: 81 year old with history of RA ILD, asthma, GERD, postnasal drip, OSA She was previously followed by Dr. Carlis Abbott Her rheumatoid arthritis was well controlled on methotrexate but stopped 4 to 5 years ago out of concern for methotrexate induced lung injury.  She continued to have joint symptoms and is currently on Xeljanz and Plaquenil under the care of Dr. Trudie Reed, rheumatology  Has history of COPD though she is a non-smoker.  She had been maintained on Spiriva, stiolto.  This was changed to breztri on 08/28/2021 during an office evaluation for worsening dyspnea, wheezing She had previously been on steroids which did not help her symptoms  History notable for OSA.  She stopped using the CPAP many years ago as she did not like it.  She feels he does not have apnea at present and does not want to reassess  Pets: No pets Occupation: Housewife Exposures: No mold, hot tub, Jacuzzi.  No feather pillows or comforter ILD questionnaire 09/28/2021-negative for exposures Smoking history: Never smoker Travel history: Originally from [place].  No significant travel history Relevant family history: Father died from lung cancer.  He was a smoker.  Interim history: Started on Singulair at last visit.  She continues on breztri.  Continues to have significant dyspnea on exertion with wheezing.  Outpatient Encounter Medications as of 12/04/2021  Medication Sig   albuterol (VENTOLIN HFA) 108 (90 Base) MCG/ACT inhaler Inhale 2 puffs into the lungs every 6 (six) hours as needed for wheezing or shortness of breath.   atorvastatin (LIPITOR) 40 MG tablet Take 1 tablet by mouth once daily   Budeson-Glycopyrrol-Formoterol (BREZTRI AEROSPHERE) 160-9-4.8 MCG/ACT AERO  Inhale 2 puffs into the lungs in the morning and at bedtime.   cloNIDine (CATAPRES) 0.1 MG tablet Take 1 tablet by mouth twice daily   cyanocobalamin 2000 MCG tablet Take 2,000 mcg by mouth daily.   diphenhydramine-acetaminophen (TYLENOL PM) 25-500 MG TABS tablet Take 1 tablet by mouth at bedtime as needed.   ferrous sulfate 325 (65 FE) MG tablet Take 325 mg by mouth daily with breakfast.   gabapentin (NEURONTIN) 300 MG capsule Take 1 capsule by mouth twice daily   HYDROcodone-acetaminophen (NORCO/VICODIN) 5-325 MG tablet Take 1 tablet by mouth every 6 (six) hours as needed for moderate pain.   hydroxychloroquine (PLAQUENIL) 200 MG tablet Take 1 tablet (200 mg total) by mouth 2 (two) times daily.   lisinopril (ZESTRIL) 10 MG tablet Take 1 tablet by mouth once daily   montelukast (SINGULAIR) 10 MG tablet Take 1 tablet (10 mg total) by mouth at bedtime.   omeprazole (PRILOSEC) 40 MG capsule Take 1 capsule by mouth twice daily   ONETOUCH ULTRA test strip USE TO CHECK BLOOD SUGAR ONCE DAILY IN THE MORNING   PARoxetine (PAXIL-CR) 25 MG 24 hr tablet Take 1 tablet by mouth once daily   rOPINIRole (REQUIP) 1 MG tablet TAKE 1 TABLET EVERY MORNINGAND 2 TABLETS AT BEDTIME   Tofacitinib Citrate (XELJANZ) 5 MG TABS Take 5 mg by mouth 2 (two) times daily. Take twice a day.   Vitamin D, Ergocalciferol, (DRISDOL) 1.25 MG (50000 UNIT) CAPS capsule Take 1 capsule by mouth once a week   No facility-administered encounter medications on file as  of 12/04/2021.   Physical Exam: Blood pressure (!) 150/70, pulse 94, temperature 97.8 F (36.6 C), temperature source Oral, height 5' (1.524 m), weight 206 lb (93.4 kg), SpO2 95 %. Gen:      No acute distress HEENT:  EOMI, sclera anicteric Neck:     No masses; no thyromegaly Lungs:    Expiratory wheeze CV:         Regular rate and rhythm; no murmurs Abd:      + bowel sounds; soft, non-tender; no palpable masses, no distension Ext:    No edema; adequate peripheral  perfusion Skin:      Warm and dry; no rash Neuro: alert and oriented x 3 Psych: normal mood and affect   Data Reviewed: Imaging: High-res CT 09/08/2019-peripheral reticular densities, bronchiolectasis, lung nodules.  High-res CT 04/18/2020-stable scarring, basal fibrosis  High-resolution CT 06/30/2021- Minimal subpleural reticulation and traction bronchiectasis in the lower lobes, biapical scarring, calcified granuloma  I have reviewed the images personally  PFTs: 11/28/2020 FVC 1.51 [9%], FEV1 1.04 [64%], F/F 69, TLC 4.10 [92%], DLCO 10.99 [67%] Moderate diffusion defect  Labs: CBC 03/30/2021-WBC 7.3, eos 4%, absolute eosinophil count 292 CBC 10/30/2021-WBC 7.7, eos 5%, absolute eosinophil count 385  IgE 09/29/2019 2-4 Alpha-1 antitrypsin 09/28/2021-150, PI FM  Assessment:  Asthma She likely has reactive airway disease given presentation with wheezing.  Although she has diagnosis of COPD in the chart there is no significant obstruction and she is a non-smoker. Has mildly elevated peripheral eosinophils and seems to be slightly better with breztri compared stiolto Suspect her GERD and postnasal drip is playing a role and presentation Also consider upper airway disease, vocal cord issues as her wheeze appears to be upper airway predominant  As she continues to be symptomatic we will escalate therapy to Dupixent Continue breztri, Singulair Continue PPI twice daily.  For postnasal drip she will continue Flonase and Zyrtec  Refer to ENT for examination of vocal cords  RA ILD I have reviewed her scans dating back to 2015.  She has very minimal changes at the base which is unchanged.  No specific treatment required at present She continues on Xeljanz and Plaquenil per her rheumatologist Continue monitoring.  Of note she is on chronic nitrofurantoin and has been on methotrexate and amiodarone in the past  Plan/Recommendations: Continue breztri, Singulair Start  Dupixent PPI Flonase, Zyrtec ENT referral  Marshell Garfinkel MD [facility] 12/04/2021, 8:52 AM  CC: [name], MD

## 2021-12-04 NOTE — Patient Instructions (Signed)
Sorry that your breathing has not improved and you continue to have wheezing in spite of breztri and Singulair We will start you on a medication called Dupixent injections Follow-up in 2 to 3 months

## 2021-12-05 ENCOUNTER — Telehealth: Payer: Self-pay

## 2021-12-05 NOTE — Telephone Encounter (Signed)
Received New start paperwork for [facility]. Will update as we work through the benefits process.  Submitted a Prior Authorization request to [facility] for [place] via CoverMyMeds. Will update once we receive a response.   Key: [id number]

## 2021-12-11 DIAGNOSIS — R490 Dysphonia: Secondary | ICD-10-CM | POA: Diagnosis not present

## 2021-12-11 DIAGNOSIS — I10 Essential (primary) hypertension: Secondary | ICD-10-CM

## 2021-12-11 DIAGNOSIS — E1159 Type 2 diabetes mellitus with other circulatory complications: Secondary | ICD-10-CM

## 2021-12-11 DIAGNOSIS — M810 Age-related osteoporosis without current pathological fracture: Secondary | ICD-10-CM | POA: Diagnosis not present

## 2021-12-11 DIAGNOSIS — J45909 Unspecified asthma, uncomplicated: Secondary | ICD-10-CM | POA: Diagnosis not present

## 2021-12-11 DIAGNOSIS — J984 Other disorders of lung: Secondary | ICD-10-CM | POA: Diagnosis not present

## 2021-12-11 DIAGNOSIS — Z7951 Long term (current) use of inhaled steroids: Secondary | ICD-10-CM | POA: Diagnosis not present

## 2021-12-11 DIAGNOSIS — M069 Rheumatoid arthritis, unspecified: Secondary | ICD-10-CM

## 2021-12-11 DIAGNOSIS — R062 Wheezing: Secondary | ICD-10-CM | POA: Diagnosis not present

## 2021-12-11 DIAGNOSIS — J342 Deviated nasal septum: Secondary | ICD-10-CM | POA: Diagnosis not present

## 2021-12-11 DIAGNOSIS — R061 Stridor: Secondary | ICD-10-CM | POA: Diagnosis not present

## 2021-12-11 DIAGNOSIS — J3089 Other allergic rhinitis: Secondary | ICD-10-CM | POA: Diagnosis not present

## 2021-12-11 DIAGNOSIS — K117 Disturbances of salivary secretion: Secondary | ICD-10-CM | POA: Diagnosis not present

## 2021-12-11 DIAGNOSIS — R0600 Dyspnea, unspecified: Secondary | ICD-10-CM | POA: Diagnosis not present

## 2021-12-11 DIAGNOSIS — E785 Hyperlipidemia, unspecified: Secondary | ICD-10-CM | POA: Diagnosis not present

## 2021-12-11 DIAGNOSIS — F32A Depression, unspecified: Secondary | ICD-10-CM | POA: Diagnosis not present

## 2021-12-14 ENCOUNTER — Ambulatory Visit: Payer: Medicare Other | Admitting: Family Medicine

## 2021-12-14 NOTE — Progress Notes (Signed)
Subjective:  Patient ID: Sherry Burke, female    DOB: Dec 09, 1940  Age: 81 y.o. MRN: [id number]  Chief Complaint  Patient presents with   Chronic respiratory failure    6 week follow up   Chronic Respiratory failure: 6 week follow up. Patient uses oxygen with any ambulation. 2 L. Patient saw Dr. Gaylyn Cheers who did not feel their was a source for wheezing. He conducted a nasopharyngoscopy. I was concerned about stridor. Patient saw Dr Vaughan Browner. He took her off singulair as it was not helping. He is starting her on dupixent and continuing breztri 2 puffs twice daily. Patient gets very shortness of breath with ambulation and with minimal exertion. Per patient she was not walked at pulmonology.   Current Outpatient Medications on File Prior to Visit  Medication Sig Dispense Refill   albuterol (VENTOLIN HFA) 108 (90 Base) MCG/ACT inhaler Inhale 2 puffs into the lungs every 6 (six) hours as needed for wheezing or shortness of breath. 18 g 11   atorvastatin (LIPITOR) 40 MG tablet Take 1 tablet by mouth once daily 90 tablet 0   cloNIDine (CATAPRES) 0.1 MG tablet Take 1 tablet by mouth twice daily 180 tablet 0   cyanocobalamin 2000 MCG tablet Take 2,000 mcg by mouth daily.     diphenhydramine-acetaminophen (TYLENOL PM) 25-500 MG TABS tablet Take 1 tablet by mouth at bedtime as needed.     ferrous sulfate 325 (65 FE) MG tablet Take 325 mg by mouth daily with breakfast.     gabapentin (NEURONTIN) 300 MG capsule Take 1 capsule by mouth twice daily 180 capsule 0   HYDROcodone-acetaminophen (NORCO/VICODIN) 5-325 MG tablet Take 1 tablet by mouth every 6 (six) hours as needed for moderate pain. 120 tablet 0   hydroxychloroquine (PLAQUENIL) 200 MG tablet Take 1 tablet (200 mg total) by mouth 2 (two) times daily. 60 tablet 2   lisinopril (ZESTRIL) 10 MG tablet Take 1 tablet by mouth once daily 90 tablet 0   omeprazole (PRILOSEC) 40 MG capsule Take 1 capsule by mouth twice daily 180 capsule 1   ONETOUCH ULTRA test strip  USE TO CHECK BLOOD SUGAR ONCE DAILY IN THE MORNING     PARoxetine (PAXIL-CR) 25 MG 24 hr tablet Take 1 tablet by mouth once daily 90 tablet 1   rOPINIRole (REQUIP) 1 MG tablet TAKE 1 TABLET EVERY MORNINGAND 2 TABLETS AT BEDTIME 270 tablet 3   Tofacitinib Citrate (XELJANZ) 5 MG TABS Take 5 mg by mouth 2 (two) times daily. Take twice a day.     Vitamin D, Ergocalciferol, (DRISDOL) 1.25 MG (50000 UNIT) CAPS capsule Take 1 capsule by mouth once a week 12 capsule 0   No current facility-administered medications on file prior to visit.   Past Medical History:  Diagnosis Date   Acute renal insufficiency    Asthma    Chronic bronchitis    COPD (chronic obstructive pulmonary disease) (HCC)    Depression    History of migraine headaches    Hyperlipemia    OSA (obstructive sleep apnea)    pt states she doesn't have it   Osteoarthritis    Pneumonia    Rheumatoid arthritis(714.0)    RLS (restless legs syndrome)    Vitamin D deficiency disease    Past Surgical History:  Procedure Laterality Date   KNEE SURGERY Bilateral    when younger   TOTAL ABDOMINAL HYSTERECTOMY  06/1988    Family History  Problem Relation Age of Onset   Lung cancer Father  Stroke Mother    Hypertension Mother    Diabetes Mother    Rheum arthritis Sister    Rheum arthritis Sister    Social History   Socioeconomic History   Marital status: Married    Spouse name: Durene Fruits   Number of children: 3   Years of education: Not on file   Highest education level: Not on file  Occupational History   Occupation: retired    Comment: homemaker  Tobacco Use   Smoking status: Never    Passive exposure: Yes   Smokeless tobacco: Never  Vaping Use   Vaping Use: Never used  Substance and Sexual Activity   Alcohol use: No   Drug use: No   Sexual activity: Not Currently    Birth control/protection: None    Comment: Married  Other Topics Concern   Not on file  Social History Narrative   Lives with her husband, who is  very helpful and supportive.   Social Determinants of Health   Financial Resource Strain: Low Risk    Difficulty of Paying Living Expenses: Not hard at all  Food Insecurity: No Food Insecurity   Worried About Charity fundraiser in the Last Year: Never true   Arboriculturist in the Last Year: Never true  Transportation Needs: Not on file  Physical Activity: Inactive   Days of Exercise per Week: 0 days   Minutes of Exercise per Session: 0 min  Stress: Not on file  Social Connections: Not on file    Review of Systems  Constitutional:  Positive for fatigue. Negative for appetite change and fever.  HENT:  Negative for congestion, ear pain, sinus pressure and sore throat.   Eyes:  Negative for pain.  Respiratory:  Positive for cough, shortness of breath and wheezing. Negative for chest tightness.   Cardiovascular:  Negative for chest pain and palpitations.  Gastrointestinal:  Negative for abdominal pain, constipation, diarrhea, nausea and vomiting.  Genitourinary:  Negative for dysuria and hematuria.  Musculoskeletal:  Positive for back pain. Negative for arthralgias, joint swelling and myalgias.  Skin:  Negative for rash.  Neurological:  Positive for weakness. Negative for dizziness and headaches.  Hematological:  Positive for adenopathy.  Psychiatric/Behavioral:  Negative for dysphoric mood. The patient is not nervous/anxious.     Objective:  BP 120/60    Pulse 88    Temp (!) 97.1 F (36.2 C)    Resp 20    Ht 5' (1.524 m)    Wt 208 lb (94.3 kg)    SpO2 96%    BMI 40.62 kg/m   BP/Weight 12/17/2021 12/04/2021 16/08/9603  Systolic BP 540 981 191  Diastolic BP 60 70 68  Wt. (Lbs) 208 206 208  BMI 40.62 40.23 40.62    Physical Exam Vitals reviewed.  Constitutional:      Appearance: Normal appearance. She is normal weight.  Cardiovascular:     Rate and Rhythm: Normal rate and regular rhythm.     Heart sounds: Normal heart sounds.  Pulmonary:     Effort: Pulmonary effort is  normal. No respiratory distress.     Breath sounds: Normal breath sounds. Stridor (with ambulation. wheezing,) present.  Neurological:     Mental Status: She is alert and oriented to person, place, and time.  Psychiatric:        Mood and Affect: Mood normal.        Behavior: Behavior normal.    Diabetic Foot Exam - Simple   No  data filed      Lab Results  Component Value Date   WBC 7.7 10/30/2021   HGB 10.9 (L) 10/30/2021   HCT 33.7 (L) 10/30/2021   PLT 163 10/30/2021   GLUCOSE 106 (H) 10/30/2021   CHOL 184 10/30/2021   TRIG 101 10/30/2021   HDL 77 10/30/2021   LDLCALC 89 10/30/2021   ALT 19 10/30/2021   AST 25 10/30/2021   NA 145 (H) 10/30/2021   K 5.5 (H) 10/30/2021   CL 109 (H) 10/30/2021   CREATININE 1.28 (H) 10/30/2021   BUN 27 10/30/2021   CO2 20 10/30/2021   TSH 2.080 03/30/2021   HGBA1C 6.3 (H) 10/30/2021      Assessment & Plan:   Problem List Items Addressed This Visit       Respiratory   Moderate persistent asthma    Continue breztri 2 puffs twice daily.  Still becomes very dyspneic, hypoxic, and stridorous with minimal exertion.      Relevant Medications   Budeson-Glycopyrrol-Formoterol (BREZTRI AEROSPHERE) 160-9-4.8 MCG/ACT AERO   Chronic respiratory failure with hypoxia (HCC) - Primary    Continue Oxygen at 2 L.         Other   Dyspnea on exertion    See above.       Stridor    I am wondering if patient would benefit from bronchoscopy.  I will send a message to Dr. Vaughan Browner.      Hypokalemia    Check cmp      Relevant Orders   Comprehensive metabolic panel  .  Meds ordered this encounter  Medications   Budeson-Glycopyrrol-Formoterol (BREZTRI AEROSPHERE) 160-9-4.8 MCG/ACT AERO    Sig: Inhale 2 puffs into the lungs in the morning and at bedtime.    Dispense:  32.1 g    Refill:  1    Order Specific Question:   Lot Number?    Answer:   [id number]    Order Specific Question:   Expiration Date?    Answer:   02/09/2024    Order  Specific Question:   Manufacturer?    Answer:   [employer] [[id number]]    Orders Placed This Encounter  Procedures   Comprehensive metabolic panel    Total time spent on today's visit was greater than 30 minutes, including both face-to-face time and nonface-to-face time personally spent on review of chart (labs and imaging), discussing labs and goals, discussing further work-up, treatment options, referrals to specialist if needed, reviewing outside records of pertinent, answering patient's questions, and coordinating care.  Follow-up: Return in about 6 weeks (around 01/29/2022) for chronic fasting.  An After Visit Summary was printed and given to the patient.  [name], MD [facility] [phone]

## 2021-12-15 ENCOUNTER — Other Ambulatory Visit: Payer: Self-pay | Admitting: Family Medicine

## 2021-12-15 ENCOUNTER — Other Ambulatory Visit: Payer: Self-pay | Admitting: Physician Assistant

## 2021-12-15 DIAGNOSIS — F331 Major depressive disorder, recurrent, moderate: Secondary | ICD-10-CM

## 2021-12-15 DIAGNOSIS — K219 Gastro-esophageal reflux disease without esophagitis: Secondary | ICD-10-CM

## 2021-12-17 ENCOUNTER — Ambulatory Visit (INDEPENDENT_AMBULATORY_CARE_PROVIDER_SITE_OTHER): Payer: Medicare Other | Admitting: Family Medicine

## 2021-12-17 ENCOUNTER — Other Ambulatory Visit: Payer: Self-pay

## 2021-12-17 ENCOUNTER — Encounter: Payer: Self-pay | Admitting: Family Medicine

## 2021-12-17 VITALS — BP 120/60 | HR 88 | Temp 97.1°F | Resp 20 | Ht 60.0 in | Wt 208.0 lb

## 2021-12-17 DIAGNOSIS — R061 Stridor: Secondary | ICD-10-CM | POA: Diagnosis not present

## 2021-12-17 DIAGNOSIS — E876 Hypokalemia: Secondary | ICD-10-CM | POA: Diagnosis not present

## 2021-12-17 DIAGNOSIS — R0609 Other forms of dyspnea: Secondary | ICD-10-CM | POA: Diagnosis not present

## 2021-12-17 DIAGNOSIS — J9611 Chronic respiratory failure with hypoxia: Secondary | ICD-10-CM

## 2021-12-17 DIAGNOSIS — J4540 Moderate persistent asthma, uncomplicated: Secondary | ICD-10-CM

## 2021-12-17 LAB — COMPREHENSIVE METABOLIC PANEL
ALT: 14 IU/L (ref 0–32)
AST: 22 IU/L (ref 0–40)
Albumin/Globulin Ratio: 1.6 (ref 1.2–2.2)
Albumin: 3.8 g/dL (ref 3.7–4.7)
Alkaline Phosphatase: 64 IU/L (ref 44–121)
BUN/Creatinine Ratio: 18 (ref 12–28)
BUN: 26 mg/dL (ref 8–27)
Bilirubin Total: 0.3 mg/dL (ref 0.0–1.2)
CO2: 25 mmol/L (ref 20–29)
Calcium: 9 mg/dL (ref 8.7–10.3)
Chloride: 103 mmol/L (ref 96–106)
Creatinine, Ser: 1.42 mg/dL — ABNORMAL HIGH (ref 0.57–1.00)
Globulin, Total: 2.4 g/dL (ref 1.5–4.5)
Glucose: 105 mg/dL — ABNORMAL HIGH (ref 70–99)
Potassium: 4.9 mmol/L (ref 3.5–5.2)
Sodium: 140 mmol/L (ref 134–144)
Total Protein: 6.2 g/dL (ref 6.0–8.5)
eGFR: 37 mL/min/{1.73_m2} — ABNORMAL LOW (ref >59)

## 2021-12-17 MED ORDER — BREZTRI AEROSPHERE 160-9-4.8 MCG/ACT IN AERO: 2.0000 | INHALATION_SPRAY | Freq: Two times a day (BID) | RESPIRATORY_TRACT | 1 refills | Status: DC

## 2021-12-17 NOTE — Assessment & Plan Note (Signed)
I am wondering if patient would benefit from bronchoscopy.  I will send a message to Dr. Vaughan Browner.

## 2021-12-17 NOTE — Assessment & Plan Note (Signed)
See above

## 2021-12-17 NOTE — Assessment & Plan Note (Signed)
Continue breztri 2 puffs twice daily.  Still becomes very dyspneic, hypoxic, and stridorous with minimal exertion.

## 2021-12-17 NOTE — Assessment & Plan Note (Signed)
Check cmp 

## 2021-12-17 NOTE — Assessment & Plan Note (Signed)
Continue Oxygen at 2 L.

## 2021-12-18 NOTE — Telephone Encounter (Signed)
Received a fax regarding Prior Authorization from  [facility] FEP  for [place]. Authorization has been DENIED because: patient must have inadequate asthmatic symptom control after minimum of 3 months of use with ICS + LABA or ICS + LAMA.Marland Kitchen  Phone# [phone]  Knox Saliva, PharmD, MPH, BCPS Clinical Pharmacist (Rheumatology and Pulmonology)

## 2021-12-19 ENCOUNTER — Other Ambulatory Visit: Payer: Self-pay

## 2021-12-19 MED ORDER — LISINOPRIL 10 MG PO TABS: 10.0000 mg | ORAL_TABLET | Freq: Every day | ORAL | 0 refills | Status: DC

## 2021-12-24 ENCOUNTER — Other Ambulatory Visit: Payer: Self-pay

## 2021-12-24 MED ORDER — HYDROCODONE-ACETAMINOPHEN 5-325 MG PO TABS: 1.0000 | ORAL_TABLET | Freq: Four times a day (QID) | ORAL | 0 refills | Status: DC | PRN

## 2021-12-29 ENCOUNTER — Other Ambulatory Visit: Payer: Self-pay | Admitting: Family Medicine

## 2022-01-02 ENCOUNTER — Telehealth: Payer: Self-pay

## 2022-01-02 NOTE — Chronic Care Management (AMB) (Signed)
Chronic Care Management Pharmacy Assistant   Name: Sherry Burke  MRN: [id number] DOB: 30-Apr-1941   Reason for Encounter: Disease State call for HTN   Recent office visits:  12/17/21 [name] MD. Seen for Chronic Respiratory Failure. No med changes.   Recent consult visits:  None  Hospital visits:  None  Medications: Outpatient Encounter Medications as of 01/02/2022  Medication Sig   albuterol (VENTOLIN HFA) 108 (90 Base) MCG/ACT inhaler Inhale 2 puffs into the lungs every 6 (six) hours as needed for wheezing or shortness of breath.   atorvastatin (LIPITOR) 40 MG tablet Take 1 tablet by mouth once daily   Budeson-Glycopyrrol-Formoterol (BREZTRI AEROSPHERE) 160-9-4.8 MCG/ACT AERO Inhale 2 puffs into the lungs in the morning and at bedtime.   cloNIDine (CATAPRES) 0.1 MG tablet Take 1 tablet by mouth twice daily   cyanocobalamin 2000 MCG tablet Take 2,000 mcg by mouth daily.   diphenhydramine-acetaminophen (TYLENOL PM) 25-500 MG TABS tablet Take 1 tablet by mouth at bedtime as needed.   ferrous sulfate 325 (65 FE) MG tablet Take 325 mg by mouth daily with breakfast.   gabapentin (NEURONTIN) 300 MG capsule Take 1 capsule by mouth twice daily   HYDROcodone-acetaminophen (NORCO/VICODIN) 5-325 MG tablet Take 1 tablet by mouth every 6 (six) hours as needed for moderate pain.   hydroxychloroquine (PLAQUENIL) 200 MG tablet Take 1 tablet (200 mg total) by mouth 2 (two) times daily.   lisinopril (ZESTRIL) 10 MG tablet Take 1 tablet (10 mg total) by mouth daily.   omeprazole (PRILOSEC) 40 MG capsule Take 1 capsule by mouth twice daily   ONETOUCH ULTRA test strip USE TO CHECK BLOOD SUGAR ONCE DAILY IN THE MORNING   PARoxetine (PAXIL-CR) 25 MG 24 hr tablet Take 1 tablet by mouth once daily   rOPINIRole (REQUIP) 1 MG tablet TAKE 1 TABLET EVERY MORNINGAND 2 TABLETS AT BEDTIME   Tofacitinib Citrate (XELJANZ) 5 MG TABS Take 5 mg by mouth 2 (two) times daily. Take twice a day.   Vitamin D,  Ergocalciferol, (DRISDOL) 1.25 MG (50000 UNIT) CAPS capsule Take 1 capsule by mouth once a week   No facility-administered encounter medications on file as of 01/02/2022.     Recent Office Vitals: BP Readings from Last 3 Encounters:  12/17/21 120/60  12/04/21 (!) 150/70  10/30/21 140/68   Pulse Readings from Last 3 Encounters:  12/17/21 88  12/04/21 94  10/30/21 94    Wt Readings from Last 3 Encounters:  12/17/21 208 lb (94.3 kg)  12/04/21 206 lb (93.4 kg)  10/30/21 208 lb (94.3 kg)     Kidney Function Lab Results  Component Value Date/Time   CREATININE 1.42 (H) 12/17/2021 10:34 AM   CREATININE 1.28 (H) 10/30/2021 11:05 AM   GFRNONAA 40 (L) 01/01/2021 10:16 AM   GFRAA 46 (L) 01/01/2021 10:16 AM    BMP Latest Ref Rng & Units 12/17/2021 10/30/2021 03/30/2021  Glucose 70 - 99 mg/dL 105(H) 106(H) 90  BUN 8 - 27 mg/dL 26 27 26   Creatinine 0.57 - 1.00 mg/dL 1.42(H) 1.28(H) 1.22(H)  BUN/Creat Ratio 12 - 28 18 21 21   Sodium 134 - 144 mmol/L 140 145(H) 142  Potassium 3.5 - 5.2 mmol/L 4.9 5.5(H) 4.4  Chloride 96 - 106 mmol/L 103 109(H) 104  CO2 20 - 29 mmol/L 25 20 21   Calcium 8.7 - 10.3 mg/dL 9.0 9.0 9.3     Current antihypertensive regimen:  Lisinopril 10mg  daily  Patient verbally confirms she is taking the above  medications as directed. Yes  How often are you checking your Blood Pressure? infrequently  Current home BP readings: Pt has not had any readings but stated she will start taking it for [place].   Wrist or arm cuff:Arm  Caffeine intake:Coffee  Salt intake: Tries to watch   Any readings above 180/120? No  What recent interventions/DTPs have been made by any provider to improve Blood Pressure control since last CPP Visit: Pt denies any changes   Any recent hospitalizations or ED visits since last visit with CPP? No  What diet changes have been made to improve Blood Pressure Control?  Pt stated she eats a lot of processed foods due to limited mobility. She stated  she has a lot of swelling in legs and feet.   What exercise is being done to improve your Blood Pressure Control?  Pt is not exercising due to having very bad knees. She stated both her knees are gone   Adherence Review: Is the patient currently on ACE/ARB medication? Yes Does the patient have >5 day gap between last estimated fill dates? CPP to review  Care Gaps: Last annual wellness visit?None noted   Star Rating Drugs:  Medication:  Last Fill: Day Supply Lisinopril   12/21/21 90ds    10/07/21 [place], [place] Pharmacist Assistant  [phone]

## 2022-01-08 ENCOUNTER — Encounter: Payer: Self-pay | Admitting: Pulmonary Disease

## 2022-01-08 ENCOUNTER — Other Ambulatory Visit: Payer: Self-pay

## 2022-01-08 ENCOUNTER — Ambulatory Visit (INDEPENDENT_AMBULATORY_CARE_PROVIDER_SITE_OTHER): Payer: Medicare Other | Admitting: Pulmonary Disease

## 2022-01-08 VITALS — BP 126/62 | HR 85 | Temp 98.0°F | Ht 60.0 in | Wt 207.0 lb

## 2022-01-08 DIAGNOSIS — J849 Interstitial pulmonary disease, unspecified: Secondary | ICD-10-CM | POA: Diagnosis not present

## 2022-01-08 DIAGNOSIS — M05712 Rheumatoid arthritis with rheumatoid factor of left shoulder without organ or systems involvement: Secondary | ICD-10-CM

## 2022-01-08 DIAGNOSIS — R0602 Shortness of breath: Secondary | ICD-10-CM

## 2022-01-08 DIAGNOSIS — M05711 Rheumatoid arthritis with rheumatoid factor of right shoulder without organ or systems involvement: Secondary | ICD-10-CM

## 2022-01-08 DIAGNOSIS — G4733 Obstructive sleep apnea (adult) (pediatric): Secondary | ICD-10-CM | POA: Diagnosis not present

## 2022-01-08 LAB — CBC WITH DIFFERENTIAL/PLATELET
Basophils Absolute: 0 10*3/uL (ref 0.0–0.1)
Basophils Relative: 0.7 % (ref 0.0–3.0)
Eosinophils Absolute: 0.2 10*3/uL (ref 0.0–0.7)
Eosinophils Relative: 3.7 % (ref 0.0–5.0)
HCT: 30.2 % — ABNORMAL LOW (ref 36.0–46.0)
Hemoglobin: 9.8 g/dL — ABNORMAL LOW (ref 12.0–15.0)
Lymphocytes Relative: 15.6 % (ref 12.0–46.0)
Lymphs Abs: 1 10*3/uL (ref 0.7–4.0)
MCHC: 32.6 g/dL (ref 30.0–36.0)
MCV: 87.7 fl (ref 78.0–100.0)
Monocytes Absolute: 0.6 10*3/uL (ref 0.1–1.0)
Monocytes Relative: 10.2 % (ref 3.0–12.0)
Neutro Abs: 4.3 10*3/uL (ref 1.4–7.7)
Neutrophils Relative %: 69.8 % (ref 43.0–77.0)
Platelets: 181 10*3/uL (ref 150.0–400.0)
RBC: 3.44 Mil/uL — ABNORMAL LOW (ref 3.87–5.11)
RDW: 16.7 % — ABNORMAL HIGH (ref 11.5–15.5)
WBC: 6.2 10*3/uL (ref 4.0–10.5)

## 2022-01-08 LAB — COMPREHENSIVE METABOLIC PANEL
ALT: 20 U/L (ref 0–35)
AST: 23 U/L (ref 0–37)
Albumin: 3.9 g/dL (ref 3.5–5.2)
Alkaline Phosphatase: 53 U/L (ref 39–117)
BUN: 26 mg/dL — ABNORMAL HIGH (ref 6–23)
CO2: 28 mEq/L (ref 19–32)
Calcium: 9.4 mg/dL (ref 8.4–10.5)
Chloride: 107 mEq/L (ref 96–112)
Creatinine, Ser: 1.34 mg/dL — ABNORMAL HIGH (ref 0.40–1.20)
GFR: 37.52 mL/min — ABNORMAL LOW (ref >60.00)
Glucose, Bld: 90 mg/dL (ref 70–99)
Potassium: 5 mEq/L (ref 3.5–5.1)
Sodium: 142 mEq/L (ref 135–145)
Total Bilirubin: 0.4 mg/dL (ref 0.2–1.2)
Total Protein: 6.9 g/dL (ref 6.0–8.3)

## 2022-01-08 MED ORDER — BREZTRI AEROSPHERE 160-9-4.8 MCG/ACT IN AERO: 2.0000 | INHALATION_SPRAY | Freq: Two times a day (BID) | RESPIRATORY_TRACT | 1 refills | Status: DC

## 2022-01-08 MED ORDER — PREDNISONE 20 MG PO TABS: ORAL_TABLET | ORAL | 0 refills | Status: DC

## 2022-01-08 MED ORDER — FAMOTIDINE 20 MG PO TABS: 20.0000 mg | ORAL_TABLET | Freq: Every day | ORAL | 5 refills | Status: DC

## 2022-01-08 MED ORDER — MONTELUKAST SODIUM 10 MG PO TABS: 10.0000 mg | ORAL_TABLET | Freq: Every day | ORAL | 1 refills | Status: DC

## 2022-01-08 NOTE — Patient Instructions (Addendum)
Continue supplemental oxygen We will repeat high-res CT for evaluation of the lung.  Based on the images we may consider a bronchoscopy Will reorder breztri, restart Singulair.  We will send in a new prescription if needed We will give a prescription for prednisone 40 mg a day for 5 days  We will stop the lisinopril and start you on Norvasc 5 mg a day.  Please follow-up with primary care to recheck blood pressure Check labs today including CBC with differential, IgE and proBNP for dyspnea I will work with pharmacy to get Dupixent started  Follow-up in 1 month.

## 2022-01-08 NOTE — Progress Notes (Addendum)
Sherry Burke    [id number]    01-26-1941  Primary Care Physician:Cox, Elnita Maxwell, MD  Referring Physician: [name], MD [address] [place] [address] [name],  [place] [address]  Chief complaint: Follow up for RA ILD, asthma, dyspnea  HPI: 81 year old with history of RA ILD, asthma, GERD, postnasal drip, OSA She was previously followed by Dr. Carlis Abbott Her rheumatoid arthritis was well controlled on methotrexate but stopped 4 to 5 years ago out of concern for methotrexate induced lung injury.  She continued to have joint symptoms and is currently on Xeljanz and Plaquenil under the care of Dr. Trudie Reed, rheumatology  Has history of COPD though she is a non-smoker.  She had been maintained on Spiriva, stiolto.  This was changed to breztri on 08/28/2021 during an office evaluation for worsening dyspnea, wheezing She had previously been on steroids which did not help her symptoms  History notable for OSA.  She stopped using the CPAP many years ago as she did not like it.  She feels he does not have apnea at present and does not want to reassess  Pets: No pets Occupation: Housewife Exposures: No mold, hot tub, Jacuzzi.  No feather pillows or comforter ILD questionnaire 09/28/2021-negative for exposures Smoking history: Never smoker Travel history: Originally from [place].  No significant travel history Relevant family history: Father died from lung cancer.  He was a smoker.  Interim history: Continues on Singulair.  She ran out of breztri 3 weeks ago and has not called for a refill. Saw Dr. Gaylyn Cheers, ENT at [facility].  I do not have the office records but per patient she had a normal examination with normal vocal cords  Dupixent was ordered at last visit but insurance has denied Continues to have significant dyspnea on exertion with wheezing with mostly upper airway location  Outpatient Encounter Medications as of 01/08/2022  Medication Sig   albuterol (VENTOLIN HFA) 108 (90 Base) MCG/ACT  inhaler Inhale 2 puffs into the lungs every 6 (six) hours as needed for wheezing or shortness of breath.   atorvastatin (LIPITOR) 40 MG tablet Take 1 tablet by mouth once daily   cloNIDine (CATAPRES) 0.1 MG tablet Take 1 tablet by mouth twice daily   cyanocobalamin 2000 MCG tablet Take 2,000 mcg by mouth daily.   diphenhydramine-acetaminophen (TYLENOL PM) 25-500 MG TABS tablet Take 1 tablet by mouth at bedtime as needed.   ferrous sulfate 325 (65 FE) MG tablet Take 325 mg by mouth daily with breakfast.   gabapentin (NEURONTIN) 300 MG capsule Take 1 capsule by mouth twice daily   HYDROcodone-acetaminophen (NORCO/VICODIN) 5-325 MG tablet Take 1 tablet by mouth every 6 (six) hours as needed for moderate pain.   hydroxychloroquine (PLAQUENIL) 200 MG tablet Take 1 tablet (200 mg total) by mouth 2 (two) times daily.   lisinopril (ZESTRIL) 10 MG tablet Take 1 tablet (10 mg total) by mouth daily.   omeprazole (PRILOSEC) 40 MG capsule Take 1 capsule by mouth twice daily   ONETOUCH ULTRA test strip USE TO CHECK BLOOD SUGAR ONCE DAILY IN THE MORNING   PARoxetine (PAXIL-CR) 25 MG 24 hr tablet Take 1 tablet by mouth once daily   rOPINIRole (REQUIP) 1 MG tablet TAKE 1 TABLET EVERY MORNINGAND 2 TABLETS AT BEDTIME   Tofacitinib Citrate (XELJANZ) 5 MG TABS Take 5 mg by mouth 2 (two) times daily. Take twice a day.   Vitamin D, Ergocalciferol, (DRISDOL) 1.25 MG (50000 UNIT) CAPS capsule Take 1 capsule  by mouth once a week   Budeson-Glycopyrrol-Formoterol (BREZTRI AEROSPHERE) 160-9-4.8 MCG/ACT AERO Inhale 2 puffs into the lungs in the morning and at bedtime. (Patient not taking: Reported on 01/08/2022)   No facility-administered encounter medications on file as of 01/08/2022.   Physical Exam: Blood pressure 126/62, pulse 85, temperature 98 F (36.7 C), temperature source Oral, height 5' (1.524 m), weight 207 lb (93.9 kg), SpO2 96 %. Gen:      No acute distress HEENT:  EOMI, sclera anicteric Neck:     No masses;  no thyromegaly Lungs:    Upper airway wheeze on minimal exertion CV:         Regular rate and rhythm; no murmurs Abd:      + bowel sounds; soft, non-tender; no palpable masses, no distension Ext:    No edema; adequate peripheral perfusion Skin:      Warm and dry; no rash Neuro: alert and oriented x 3 Psych: normal mood and affect   Data Reviewed: Imaging: High-res CT 09/08/2019-peripheral reticular densities, bronchiolectasis, lung nodules.  High-res CT 04/18/2020-stable scarring, basal fibrosis  High-resolution CT 06/30/2021- Minimal subpleural reticulation and traction bronchiectasis in the lower lobes, biapical scarring, calcified granuloma I have reviewed the images personally  PFTs: 09/28/2021 FVC 1.51 [9%], FEV1 1.04 [64%], F/F 69, TLC 4.10 [92%], DLCO 10.99 [67%] Moderate diffusion defect  Labs: CBC 03/30/2021-WBC 7.3, eos 4%, absolute eosinophil count 292 CBC 10/30/2021-WBC 7.7, eos 5%, absolute eosinophil count 385  IgE 09/29/2019 2-4 Alpha-1 antitrypsin 09/28/2021-150, PI FM  Assessment:  Asthma She likely has reactive airway disease given presentation with wheezing.  Although she has diagnosis of COPD in the chart there is no significant obstruction and she is a non-smoker. Has mildly elevated peripheral eosinophils Suspect her GERD and postnasal drip is playing a role and presentation Also consider upper airway disease secondary to ACE inhibitor as her wheeze appears to be upper airway predominant.  ENT examination was reportedly normal  For some reason she is off breztri and Singulair.  We will resume this.  Prednisone 40 mg a day for 5 days to see if they will improve symptoms We will appeal Dupixent denial  We will hold the ACE inhibitor for now.  Even though she has been on this medication for many years sometimes it can exacerbate cough due to other etiologies.  Will cc Dr. Tobie Poet to see if an ARB will appropriate for her Continue supplemental oxygen.  She did not  desat on exertion today but was limited in effort due to knee pain  GERD, postnasal drip Continue PPI twice daily.  Add Pepcid at night For postnasal drip she will continue Flonase and Zyrtec  RA ILD I have reviewed her scans dating back to 2015.  She has very minimal changes at the base which is unchanged.  No specific treatment required at present She continues on Xeljanz and Plaquenil per her rheumatologist Of note she is on chronic nitrofurantoin and has been on methotrexate and amiodarone in the past  We will repeat high-res CT.  If unremarkable then consider bronchoscopy if she continues to have wheezing for airway inspection  OSA Has been noncompliant with CPAP.  Noninvasive ventilation would be beneficial for her.  We discussed it today and she does not want to retry  Plan/Recommendations: High-res CT Resume breztri, Singulair Prednisone for 5 days Stop lisinopril Recheck labs including CBC, IgE, proBNP Appeal Dupixent denial Add Pepcid at night  Marshell Garfinkel MD [facility] 01/08/2022, 9:04  AM  CC: [name], MD

## 2022-01-08 NOTE — Progress Notes (Signed)
Appeal for [place] will be submitted ASAP.  Knox Saliva, PharmD, MPH, BCPS Clinical Pharmacist (Rheumatology and Pulmonology)

## 2022-01-09 LAB — IGE: IgE (Immunoglobulin E), Serum: 4 kU/L (ref <=114)

## 2022-01-09 LAB — PRO B NATRIURETIC PEPTIDE: NT-Pro BNP: 624 pg/mL (ref 0–738)

## 2022-01-11 NOTE — Telephone Encounter (Addendum)
Submitted an URGENT appeal to [place]  for [place]. Patient has taken [place] since November 2021. Changed to [facility] in October 2022. Recent OV on 01/08/22 with Dr. Vaughan Browner shows that patient has had dyspnea on exertion and wheezing despite triple inhaler + montelukast. Per rep, next step is appeal ? ?Reference # [id number]  ?Phone: [phone] ?Fax: [phone] ? ?Dupixent [employer] PAP application placed with appeal documents in appeal folder ? ?Knox Saliva, PharmD, MPH, BCPS ?Clinical Pharmacist (Rheumatology and Pulmonology) ?

## 2022-01-12 DIAGNOSIS — Z20822 Contact with and (suspected) exposure to covid-19: Secondary | ICD-10-CM | POA: Diagnosis not present

## 2022-01-14 ENCOUNTER — Telehealth: Payer: Self-pay | Admitting: Pulmonary Disease

## 2022-01-16 ENCOUNTER — Other Ambulatory Visit: Payer: Self-pay

## 2022-01-16 DIAGNOSIS — E1159 Type 2 diabetes mellitus with other circulatory complications: Secondary | ICD-10-CM

## 2022-01-16 DIAGNOSIS — I152 Hypertension secondary to endocrine disorders: Secondary | ICD-10-CM

## 2022-01-16 NOTE — Telephone Encounter (Signed)
Closing encounter as patient has been denied Dupixent and would like to hold on pursuing at this time. ? ?Knox Saliva, PharmD, MPH, BCPS ?Clinical Pharmacist (Rheumatology and Pulmonology) ?

## 2022-01-16 NOTE — Telephone Encounter (Signed)
Received fax from [facility] FEP in response to [place] appeal. This was reviewed by board-certified independent physician reviewer who confirmed original denial decision ? ?"This is a patient who has reactive airway disease but no documented bronchoreactivity. Instead of reactive airway disease, the attending provider can document 'clinical asthma with positive but inadequate response to long-term inhaled medication.' In addition there is no adequate documentation of greater than or equal to 50% adherence within the past 6 months of an ICS/LABS or ICS/LAMA. Until these are adequately documented, this request for Dupixent will be denied."  ? ?Per rep, the next step is appeal through [place] [employer] ([employer]). Appeal must be MAILED and cannot be faxed per rep. Must be submitted within 90 days. Case remains open for 6 months.  ? ?In order for patient to be eligible for Dupixent pt assistance program as a Medicare patient, she must have prior authorization approved through primary insurance. ? ?I spoke with patient regarding inhaler use. She states she is taking Breztri every day. Does report mouth dryness. She is rinsing mouth after use. She is taking 2 puffs twice daily. I advised her to chew sugar-free gum or hard candies, use Biotene oral solution (OTC), reduce caffeine intake, and increase water intake. She self-reports drinking very minimal water and more sodas.  She states she believes she was not previously taking Spiriva/Stiolto daily. She states she does not want to try for Dupixent right now because she wants to give Judithann Sauger an adequate trial. Patient states she is doing better right now with Judithann Sauger - did not cough while on phone. She states even while talking to me she is not having shortness of breath. I advised that this may likely also be due to the prednisone. ? ?She states she was told Dr. Vaughan Browner was switching her lisinopril to amlodipine but rx was never sent. ? ?Knox Saliva, PharmD, MPH,  BCPS ?Clinical Pharmacist (Rheumatology and Pulmonology) ?

## 2022-01-17 NOTE — Telephone Encounter (Signed)
I called patient and advised that her PCP, Dr. Tobie Poet will discuss alternative antihypertensives at [facility] later this month. Patient advised to continue taking lisinopril as prescribed until PCP visit. ? ?Knox Saliva, PharmD, MPH, BCPS ?Clinical Pharmacist (Rheumatology and Pulmonology) ?

## 2022-01-21 NOTE — Telephone Encounter (Signed)
I think there is some confusion regarding instructions.  She is supposed to stop her lisinopril and will be reevaluated at follow-up visit with Dr. Tobie Poet to determine if she needs alternate medication.  Although she has been on lisinopril for many years sometimes it can exacerbate cough and wheezing that is set off by another etiology.  I called the patient and clarified this ? ?She states that overall her breathing is better but has soreness in the mouth while using breztri.  We will call in a spacer and advised her to rinse mouth after every use. ?

## 2022-01-22 ENCOUNTER — Ambulatory Visit: Payer: Medicare Other

## 2022-01-22 MED ORDER — SPACER/AERO-HOLDING CHAMBERS DEVI: 0 refills | Status: AC

## 2022-01-22 NOTE — Telephone Encounter (Signed)
Spacer order placed to patient pharmacy.  Nothing further at this time. ?

## 2022-01-22 NOTE — Addendum Note (Signed)
Addended by: Elton Sin on: 01/22/2022 11:45 AM ? ? Modules accepted: Orders ? ?

## 2022-01-28 ENCOUNTER — Other Ambulatory Visit: Payer: Self-pay

## 2022-01-28 ENCOUNTER — Ambulatory Visit (INDEPENDENT_AMBULATORY_CARE_PROVIDER_SITE_OTHER): Payer: Medicare Other | Admitting: Family Medicine

## 2022-01-28 VITALS — BP 120/64 | HR 88 | Temp 97.2°F | Resp 18 | Ht 61.0 in | Wt 208.0 lb

## 2022-01-28 DIAGNOSIS — E538 Deficiency of other specified B group vitamins: Secondary | ICD-10-CM | POA: Diagnosis not present

## 2022-01-28 DIAGNOSIS — E785 Hyperlipidemia, unspecified: Secondary | ICD-10-CM | POA: Diagnosis not present

## 2022-01-28 DIAGNOSIS — J9611 Chronic respiratory failure with hypoxia: Secondary | ICD-10-CM

## 2022-01-28 DIAGNOSIS — I152 Hypertension secondary to endocrine disorders: Secondary | ICD-10-CM

## 2022-01-28 DIAGNOSIS — G2581 Restless legs syndrome: Secondary | ICD-10-CM | POA: Diagnosis not present

## 2022-01-28 DIAGNOSIS — F330 Major depressive disorder, recurrent, mild: Secondary | ICD-10-CM | POA: Diagnosis not present

## 2022-01-28 DIAGNOSIS — G4733 Obstructive sleep apnea (adult) (pediatric): Secondary | ICD-10-CM

## 2022-01-28 DIAGNOSIS — M05712 Rheumatoid arthritis with rheumatoid factor of left shoulder without organ or systems involvement: Secondary | ICD-10-CM

## 2022-01-28 DIAGNOSIS — K219 Gastro-esophageal reflux disease without esophagitis: Secondary | ICD-10-CM

## 2022-01-28 DIAGNOSIS — E1159 Type 2 diabetes mellitus with other circulatory complications: Secondary | ICD-10-CM

## 2022-01-28 DIAGNOSIS — J4540 Moderate persistent asthma, uncomplicated: Secondary | ICD-10-CM | POA: Diagnosis not present

## 2022-01-28 DIAGNOSIS — M05711 Rheumatoid arthritis with rheumatoid factor of right shoulder without organ or systems involvement: Secondary | ICD-10-CM

## 2022-01-28 DIAGNOSIS — E1169 Type 2 diabetes mellitus with other specified complication: Secondary | ICD-10-CM | POA: Diagnosis not present

## 2022-01-28 MED ORDER — OMEPRAZOLE 40 MG PO CPDR: 40.0000 mg | DELAYED_RELEASE_CAPSULE | Freq: Two times a day (BID) | ORAL | 1 refills | Status: AC

## 2022-01-28 NOTE — Progress Notes (Signed)
Subjective:  Patient ID: Sherry Burke, female    DOB: Dec 12, 1940  Age: 81 y.o. MRN: [id number]  Chief Complaint  Patient presents with   Asthma    HPI Asthma: Dr. Isaiah Serge stopped ace inhibitor and scheduled ct of chest at [facility] imaging. Continues to have dyspnea. Has oxygen at home. Patient received her cpap and on oxygen 2 L continuous. Patient needs labwork for Dr. Isaiah Serge and for Dr. Nickola Major.   Not eating healthy.   Current Outpatient Medications on File Prior to Visit  Medication Sig Dispense Refill   albuterol (VENTOLIN HFA) 108 (90 Base) MCG/ACT inhaler Inhale 2 puffs into the lungs every 6 (six) hours as needed for wheezing or shortness of breath. 18 g 11   atorvastatin (LIPITOR) 40 MG tablet Take 1 tablet by mouth once daily 90 tablet 0   Budeson-Glycopyrrol-Formoterol (BREZTRI AEROSPHERE) 160-9-4.8 MCG/ACT AERO Inhale 2 puffs into the lungs in the morning and at bedtime. 32.1 g 1   cloNIDine (CATAPRES) 0.1 MG tablet Take 1 tablet by mouth twice daily 180 tablet 0   cyanocobalamin 2000 MCG tablet Take 2,000 mcg by mouth daily.     diphenhydramine-acetaminophen (TYLENOL PM) 25-500 MG TABS tablet Take 1 tablet by mouth at bedtime as needed.     famotidine (PEPCID) 20 MG tablet Take 1 tablet (20 mg total) by mouth at bedtime. 30 tablet 5   ferrous sulfate 325 (65 FE) MG tablet Take 325 mg by mouth daily with breakfast.     gabapentin (NEURONTIN) 300 MG capsule Take 1 capsule by mouth twice daily 180 capsule 0   hydroxychloroquine (PLAQUENIL) 200 MG tablet Take 1 tablet (200 mg total) by mouth 2 (two) times daily. 60 tablet 2   montelukast (SINGULAIR) 10 MG tablet Take 1 tablet (10 mg total) by mouth at bedtime. 90 tablet 1   ONETOUCH ULTRA test strip USE TO CHECK BLOOD SUGAR ONCE DAILY IN THE MORNING     PARoxetine (PAXIL-CR) 25 MG 24 hr tablet Take 1 tablet by mouth once daily 90 tablet 1   rOPINIRole (REQUIP) 1 MG tablet TAKE 1 TABLET EVERY MORNINGAND 2 TABLETS AT BEDTIME 270  tablet 3   Spacer/Aero-Holding Chambers DEVI Use with inhaler 1 each 0   Tofacitinib Citrate (XELJANZ) 5 MG TABS Take 5 mg by mouth 2 (two) times daily. Take twice a day.     Vitamin D, Ergocalciferol, (DRISDOL) 1.25 MG (50000 UNIT) CAPS capsule Take 1 capsule by mouth once a week 12 capsule 0   lisinopril (ZESTRIL) 10 MG tablet Take 1 tablet (10 mg total) by mouth daily. (Patient not taking: Reported on 01/28/2022) 90 tablet 0   No current facility-administered medications on file prior to visit.   Past Medical History:  Diagnosis Date   Acute renal insufficiency    Asthma    Chronic bronchitis    COPD (chronic obstructive pulmonary disease) (HCC)    Depression    History of migraine headaches    Hyperlipemia    OSA (obstructive sleep apnea)    pt states she doesn't have it   Osteoarthritis    Pneumonia    Rheumatoid arthritis(714.0)    RLS (restless legs syndrome)    Vitamin D deficiency disease    Past Surgical History:  Procedure Laterality Date   KNEE SURGERY Bilateral    when younger   TOTAL ABDOMINAL HYSTERECTOMY  06/1988    Family History  Problem Relation Age of Onset   Lung cancer Father    Stroke  Mother    Hypertension Mother    Diabetes Mother    Rheum arthritis Sister    Rheum arthritis Sister    Social History   Socioeconomic History   Marital status: Married    Spouse name: Faylene Million   Number of children: 3   Years of education: Not on file   Highest education level: Not on file  Occupational History   Occupation: retired    Comment: homemaker  Tobacco Use   Smoking status: Never    Passive exposure: Yes   Smokeless tobacco: Never  Vaping Use   Vaping Use: Never used  Substance and Sexual Activity   Alcohol use: No   Drug use: No   Sexual activity: Not Currently    Birth control/protection: None    Comment: Married  Other Topics Concern   Not on file  Social History Narrative   Lives with her husband, who is very helpful and supportive.    Social Determinants of Health   Financial Resource Strain: Low Risk    Difficulty of Paying Living Expenses: Not hard at all  Food Insecurity: Not on file  Transportation Needs: Not on file  Physical Activity: Inactive   Days of Exercise per Week: 0 days   Minutes of Exercise per Session: 0 min  Stress: Not on file  Social Connections: Not on file   Review of Systems  Constitutional:  Positive for fatigue. Negative for chills and fever.  HENT:  Negative for congestion, rhinorrhea and sore throat.   Respiratory:  Positive for cough and shortness of breath.   Cardiovascular:  Negative for chest pain.  Gastrointestinal:  Negative for abdominal pain, constipation, diarrhea, nausea and vomiting.  Genitourinary:  Negative for dysuria and urgency.  Musculoskeletal:  Positive for arthralgias (bilateral knee pain) and back pain. Negative for myalgias.  Neurological:  Positive for dizziness and weakness. Negative for light-headedness and headaches.  Psychiatric/Behavioral:  Negative for dysphoric mood. The patient is not nervous/anxious.    Objective:  BP 120/64   Pulse 88   Temp (!) 97.2 F (36.2 C)   Resp 18   Ht 5\' 1"  (1.549 m)   Wt 208 lb (94.3 kg)   SpO2 97%   BMI 39.30 kg/m      01/28/2022    9:18 AM 01/08/2022    8:56 AM 12/17/2021    8:51 AM  BP/Weight  Systolic BP 120 126 120  Diastolic BP 64 62 60  Wt. (Lbs) 208 207 208  BMI 39.3 kg/m2 40.43 kg/m2 40.62 kg/m2    Physical Exam Vitals reviewed.  Constitutional:      Appearance: Normal appearance. She is obese.  Neck:     Vascular: No carotid bruit.  Cardiovascular:     Rate and Rhythm: Normal rate and regular rhythm.     Heart sounds: Normal heart sounds.  Pulmonary:     Effort: Pulmonary effort is normal. No respiratory distress.     Breath sounds: Normal breath sounds.  Abdominal:     General: Abdomen is flat. Bowel sounds are normal.     Palpations: Abdomen is soft.     Tenderness: There is no abdominal  tenderness.  Neurological:     Mental Status: She is alert and oriented to person, place, and time.  Psychiatric:        Mood and Affect: Mood normal.        Behavior: Behavior normal.    Diabetic Foot Exam - Simple   No data filed  Lab Results  Component Value Date   WBC 9.0 01/28/2022   HGB 10.2 (L) 01/28/2022   HCT 31.6 (L) 01/28/2022   PLT 152 01/28/2022   GLUCOSE 91 01/28/2022   CHOL 184 10/30/2021   TRIG 101 10/30/2021   HDL 77 10/30/2021   LDLCALC 89 10/30/2021   ALT 20 01/28/2022   AST 24 01/28/2022   NA 144 01/28/2022   K 4.7 01/28/2022   CL 107 (H) 01/28/2022   CREATININE 1.28 (H) 01/28/2022   BUN 23 01/28/2022   CO2 22 01/28/2022   TSH 2.080 03/30/2021   HGBA1C 6.3 (H) 10/30/2021      Assessment & Plan:   Problem List Items Addressed This Visit       Cardiovascular and Mediastinum   Hypertension associated with diabetes (HCC)    Well controlled.  No changes to medicines.  Continue to work on eating a healthy diet and exercise.  Labs drawn today.        Relevant Orders   CBC with Differential/Platelet (Completed)   Comprehensive metabolic panel (Completed)     Respiratory   OSA (obstructive sleep apnea)    Continue cpap.      Moderate persistent asthma    The current medical regimen is effective;  continue present plan and medications. Held lisinopril.       Chronic respiratory failure with hypoxia (HCC)    Continue oxygen at 2 L.         Digestive   GERD without esophagitis    Continue omeprazole 40 mg twice daily and pepcid.      Relevant Medications   omeprazole (PRILOSEC) 40 MG capsule     Endocrine   Dyslipidemia associated with type 2 diabetes mellitus (HCC)    Control: good  Recommend check feet daily. Recommend annual eye exams. Medicines: none Continue to work on eating a healthy diet and exercise.  Labs drawn today.           Musculoskeletal and Integument   Rheumatoid arthritis involving both  shoulders with positive rheumatoid factor (HCC)    The current medical regimen is fairly effective;  continue present plan and medications. Check labs.         Other   RLS (restless legs syndrome)    The current medical regimen is effective;  continue present plan and medications.       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       Other Visit Diagnoses     B12 deficiency       Relevant Orders   Vitamin B12 (Completed)   Methylmalonic acid, serum (Completed)     .  Meds ordered this encounter  Medications   omeprazole (PRILOSEC) 40 MG capsule    Sig: Take 1 capsule (40 mg total) by mouth 2 (two) times daily.    Dispense:  180 capsule    Refill:  1    Orders Placed This Encounter  Procedures   CBC with Differential/Platelet   Comprehensive metabolic panel   Vitamin B12   Methylmalonic acid, serum     Follow-up: Return in about 3 months (around 04/30/2022) for chronic fasting.  An After Visit Summary was printed and given to the patient.  Blane Ohara, MD [facility] [phone]

## 2022-01-30 LAB — COMPREHENSIVE METABOLIC PANEL
ALT: 20 IU/L (ref 0–32)
AST: 24 IU/L (ref 0–40)
Albumin/Globulin Ratio: 1.7 (ref 1.2–2.2)
Albumin: 4 g/dL (ref 3.7–4.7)
Alkaline Phosphatase: 62 IU/L (ref 44–121)
BUN/Creatinine Ratio: 18 (ref 12–28)
BUN: 23 mg/dL (ref 8–27)
Bilirubin Total: 0.3 mg/dL (ref 0.0–1.2)
CO2: 22 mmol/L (ref 20–29)
Calcium: 9.1 mg/dL (ref 8.7–10.3)
Chloride: 107 mmol/L — ABNORMAL HIGH (ref 96–106)
Creatinine, Ser: 1.28 mg/dL — ABNORMAL HIGH (ref 0.57–1.00)
Globulin, Total: 2.3 g/dL (ref 1.5–4.5)
Glucose: 91 mg/dL (ref 70–99)
Potassium: 4.7 mmol/L (ref 3.5–5.2)
Sodium: 144 mmol/L (ref 134–144)
Total Protein: 6.3 g/dL (ref 6.0–8.5)
eGFR: 42 mL/min/{1.73_m2} — ABNORMAL LOW (ref >59)

## 2022-01-30 LAB — CBC WITH DIFFERENTIAL/PLATELET
Basophils Absolute: 0.1 10*3/uL (ref 0.0–0.2)
Basos: 1 %
EOS (ABSOLUTE): 0.3 10*3/uL (ref 0.0–0.4)
Eos: 3 %
Hematocrit: 31.6 % — ABNORMAL LOW (ref 34.0–46.6)
Hemoglobin: 10.2 g/dL — ABNORMAL LOW (ref 11.1–15.9)
Immature Grans (Abs): 0.1 10*3/uL (ref 0.0–0.1)
Immature Granulocytes: 1 %
Lymphocytes Absolute: 1 10*3/uL (ref 0.7–3.1)
Lymphs: 11 %
MCH: 28.2 pg (ref 26.6–33.0)
MCHC: 32.3 g/dL (ref 31.5–35.7)
MCV: 87 fL (ref 79–97)
Monocytes Absolute: 0.7 10*3/uL (ref 0.1–0.9)
Monocytes: 8 %
Neutrophils Absolute: 6.9 10*3/uL (ref 1.4–7.0)
Neutrophils: 76 %
Platelets: 152 10*3/uL (ref 150–450)
RBC: 3.62 x10E6/uL — ABNORMAL LOW (ref 3.77–5.28)
RDW: 14.5 % (ref 11.7–15.4)
WBC: 9 10*3/uL (ref 3.4–10.8)

## 2022-01-30 LAB — VITAMIN B12: Vitamin B-12: 1605 pg/mL — ABNORMAL HIGH (ref 232–1245)

## 2022-01-30 LAB — METHYLMALONIC ACID, SERUM: Methylmalonic Acid: 324 nmol/L (ref 0–378)

## 2022-01-31 ENCOUNTER — Other Ambulatory Visit: Payer: Self-pay

## 2022-01-31 MED ORDER — HYDROCODONE-ACETAMINOPHEN 5-325 MG PO TABS: 1.0000 | ORAL_TABLET | Freq: Four times a day (QID) | ORAL | 0 refills | Status: DC | PRN

## 2022-02-04 ENCOUNTER — Encounter: Payer: Self-pay | Admitting: Family Medicine

## 2022-02-04 ENCOUNTER — Other Ambulatory Visit: Payer: Medicare Other

## 2022-02-04 DIAGNOSIS — K449 Diaphragmatic hernia without obstruction or gangrene: Secondary | ICD-10-CM | POA: Diagnosis not present

## 2022-02-04 DIAGNOSIS — J84112 Idiopathic pulmonary fibrosis: Secondary | ICD-10-CM | POA: Diagnosis not present

## 2022-02-04 DIAGNOSIS — I700 Atherosclerosis of aorta: Secondary | ICD-10-CM | POA: Diagnosis not present

## 2022-02-04 DIAGNOSIS — I2510 Atherosclerotic heart disease of native coronary artery without angina pectoris: Secondary | ICD-10-CM | POA: Diagnosis not present

## 2022-02-04 DIAGNOSIS — J479 Bronchiectasis, uncomplicated: Secondary | ICD-10-CM | POA: Diagnosis not present

## 2022-02-04 DIAGNOSIS — J849 Interstitial pulmonary disease, unspecified: Secondary | ICD-10-CM | POA: Diagnosis not present

## 2022-02-04 NOTE — Assessment & Plan Note (Signed)
The current medical regimen is effective;  continue present plan and medications. ?Held lisinopril.  ?

## 2022-02-04 NOTE — Assessment & Plan Note (Signed)
The current medical regimen is effective;  continue present plan and medications.  

## 2022-02-04 NOTE — Assessment & Plan Note (Signed)
Control: good ?Recommend check feet daily. ?Recommend annual eye exams. ?Medicines: none ?Continue to work on eating a healthy diet and exercise.  ?Labs drawn today.   ? ?

## 2022-02-04 NOTE — Assessment & Plan Note (Signed)
The current medical regimen is fairly effective;  continue present plan and medications. ?Check labs.  ?

## 2022-02-04 NOTE — Assessment & Plan Note (Signed)
Continue cpap.  

## 2022-02-04 NOTE — Assessment & Plan Note (Signed)
Continue oxygen at 2 L.  

## 2022-02-04 NOTE — Assessment & Plan Note (Signed)
Continue omeprazole 40 mg twice daily and pepcid. ?

## 2022-02-04 NOTE — Assessment & Plan Note (Signed)
Well controlled.  ?No changes to medicines.  ?Continue to work on eating a healthy diet and exercise.  ?Labs drawn today.  ?

## 2022-02-05 ENCOUNTER — Telehealth: Payer: Self-pay

## 2022-02-05 NOTE — Progress Notes (Signed)
Pt has been informed of appt with CPP. ? ?Sherry Burke, CMA ?Clinical Pharmacist Assistant  ?[phone]  ?

## 2022-02-07 ENCOUNTER — Ambulatory Visit (INDEPENDENT_AMBULATORY_CARE_PROVIDER_SITE_OTHER): Payer: Medicare Other

## 2022-02-07 ENCOUNTER — Telehealth: Payer: Self-pay | Admitting: Pulmonary Disease

## 2022-02-07 DIAGNOSIS — I152 Hypertension secondary to endocrine disorders: Secondary | ICD-10-CM

## 2022-02-07 DIAGNOSIS — E1169 Type 2 diabetes mellitus with other specified complication: Secondary | ICD-10-CM

## 2022-02-07 DIAGNOSIS — F330 Major depressive disorder, recurrent, mild: Secondary | ICD-10-CM

## 2022-02-07 NOTE — Patient Instructions (Signed)
Visit Information ? ? Goals Addressed   ?None ?  ? ?Patient Care Plan: [place]  ?  ? ?Problem Identified: dm, htn, hld   ?Priority: High  ?Onset Date: 01/10/2021  ?  ? ?Long-Range Goal: Disease Management   ?Start Date: 01/10/2021  ?Expected End Date: 01/10/2022  ?Recent Progress: On track  ?Priority: High  ?Note:   ? ? ? ?Current Barriers:  ?Unable to optimize arthritis regimen due to side effects.  ? ? ?Pharmacist Clinical Goal(s):  ?Over the next 90 days, patient will adhere to prescribed medication regimen as evidenced by fill history through collaboration with PharmD and provider.  ? ?Interventions: ?1:1 collaboration with Cox, Kirsten, MD regarding development and update of comprehensive plan of care as evidenced by provider attestation and co-signature ?Inter-disciplinary care team collaboration (see longitudinal plan of care) ?Comprehensive medication review performed; medication list updated in electronic medical record ? ?Hypertension (BP goal <130/80) ?BP Readings from Last 3 Encounters:  ?01/28/22 120/64  ?01/08/22 126/62  ?12/17/21 120/60  ?-Not Controlled ?-Current treatment: ?Clonidine 0.1 mg bid Appropriate, Query effective, Safe, Accessible ?-Medications previously tried: amlodipine, furosemide, spironolactone, Lisinopril (Dc'd in Feb 2023 due to coughing) ?-Current home readings:  ? -Jan 2023: Patient states commonly in 062'I systolic ? -March 9485: 462-703 systolic per patient ?-Current dietary habits: wathcing salt in diet  ?-Current exercise habits: minimal due to back and knee pain  ?-Denies hypotensive/hypertensive symptoms ?-Educated on BP goals and benefits of medications for prevention of heart attack, stroke and kidney damage; ?Daily salt intake goal < 2300 mg; ?Exercise goal of 150 minutes per week; ?Importance of home blood pressure monitoring; ?Proper BP monitoring technique; ?-Counseled to monitor BP at home weekly, document, and provide log at future appointments ?-Counseled  on diet and exercise extensively ?Recommended to continue current medication ?Recommended to continue current medication ? ?Hyperlipidemia: (LDL goal < 100) ?The ASCVD Risk score (Arnett DK, et al., 2019) failed to calculate for the following reasons: ?  The 2019 ASCVD risk score is only valid for ages 90 to 21 ?Lab Results  ?Component Value Date  ? CHOL 184 10/30/2021  ? CHOL 169 03/30/2021  ? CHOL 163 12/15/2020  ? ?Lab Results  ?Component Value Date  ? HDL 77 10/30/2021  ? HDL 77 03/30/2021  ? HDL 66 12/15/2020  ? ?Lab Results  ?Component Value Date  ? [place] 89 10/30/2021  ? [place] 76 03/30/2021  ? [place] 78 12/15/2020  ? ?Lab Results  ?Component Value Date  ? TRIG 101 10/30/2021  ? TRIG 86 03/30/2021  ? TRIG 103 12/15/2020  ? ?Lab Results  ?Component Value Date  ? CHOLHDL 2.4 10/30/2021  ? CHOLHDL 2.2 03/30/2021  ? CHOLHDL 2.5 12/15/2020  ?No results found for: LDLDIRECT ?-Not ideally controlled ?-Current treatment: ?atorvastatin 40 mg daily Appropriate, Effective, Safe, Accessible ?-Medications previously tried: none reported  ?-Current dietary patterns: vegetables, wheat bread, homemade soup ?-Current exercise habits: minimal due to knee/back pain ?-Educated on Cholesterol goals;  ?Benefits of statin for ASCVD risk reduction; ?Importance of limiting foods high in cholesterol; ?-Counseled on diet and exercise extensively ?Recommended to continue current medication ? ?Diabetes (A1c goal <7%) ?Lab Results  ?Component Value Date  ? HGBA1C 6.3 (H) 10/30/2021  ? HGBA1C 6.0 (H) 03/30/2021  ? HGBA1C 6.0 (H) 12/15/2020  ? ?Lab Results  ?Component Value Date  ? [place] 89 10/30/2021  ? CREATININE 1.28 (H) 01/28/2022  ? ?Lab Results  ?Component Value Date  ? NA 144 01/28/2022  ?  K 4.7 01/28/2022  ? CREATININE 1.28 (H) 01/28/2022  ? EGFR 42 (L) 01/28/2022  ? GFRNONAA 40 (L) 01/01/2021  ? GLUCOSE 91 01/28/2022  ? ?Lab Results  ?Component Value Date  ? WBC 9.0 01/28/2022  ? HGB 10.2 (L) 01/28/2022  ? HCT 31.6 (L)  01/28/2022  ? MCV 87 01/28/2022  ? PLT 152 01/28/2022  ?-Controlled ?-Current medications: ?Diet and lifestyle ?-Medications previously tried: n/a  ?-Denies hypoglycemic/hyperglycemic symptoms ?-Current meal patterns:  ?breakfast: eggs and toast  ?lunch: vegetables  ?dinner: sandwich ?-Current exercise: minimal due to knee/back pain  ?-Educated onA1c and blood sugar goals; ?Complications of diabetes including kidney damage, retinal damage, and cardiovascular disease; ?Carbohydrate counting and/or plate method ?-Counseled to check feet daily and get yearly eye exams ?-Counseled on diet and exercise extensively ? ? ?Depression/Anxiety (Goal: manage symptoms of depression) ?-Controlled ?-Current treatment: ? Paroxetine CR 25 mg daily Appropriate, Effective, Safe, Accessible ?-Medications previously tried/failed: none reported ?-PHQ9:  ? ?  12/04/2021  ?  1:35 PM 03/30/2021  ? 10:24 AM 01/08/2021  ?  1:36 PM  ?Depression screen PHQ 2/9  ?Decreased Interest 0 0 0  ?Down, Depressed, Hopeless 1 0 0  ?PHQ - 2 Score 1 0 0  ?-Educated on Benefits of medication for symptom control ?-Most anti-cholinergic but patient is content on therapy and doesn't wish to change things ?-Recommended to continue current medication ? ?Osteoporosis / Osteopenia (Goal reduce risk of fracture ) ?-Controlled ?-Last DEXA Scan:  ordered updated scan 12/2020. 2019 scan showed osteopenia ?-Patient's updated Dexa scan is ordered.  ?-Current treatment  ?Vitamin D 50,000 units weekly Appropriate, Effective, Safe, Accessible ?-Medications previously tried: none reported  ?-Recommend [id number] units of vitamin D daily. Recommend 1200 mg of calcium daily from dietary and supplemental sources. Recommend weight-bearing and muscle strengthening exercises for building and maintaining bone density. ?-Counseled on diet and exercise extensively ?Jan 2023: DEXA ordered for 12/2020 but unable to find results, will ask team if was ordered ?March 2023: Still haven't heard  back, will re-ask ? ?Rheumatoid Arthritis  (Goal: minimize joint pain/swelling) ?-Controlled ?-Pain Scale ?There were no vitals filed for this visit. ?Aggravating Factors: Chronic Pain  ?Pain Type: Chronic  ?Pain with Medications: 3/10 ?-Current treatment  ?Hydrocodone-acetaminophen 5-325 mg every 6 hours prn moderate pain Appropriate, Effective, Safe, Accessible ?Xeljanz 5 mg daily Appropriate, Effective, Safe, Accessible ?Hydroxychloroquine 200 mg bid Appropriate, Effective, Safe, Accessible ?Last Eye Exam: May 2022 ?On med for 4-5 years per patient ?-Medications previously tried:  methotrexate, leflunomide, nabumetone ?-Recommended to continue current medication Patient reports that she is stable on current regimen.  ?March 2023: Counseled to schedule eye exam for May 2023 ? ? ?Patient Goals/Self-Care Activities ?Over the next 90 days, patient will:  ?- take medications as prescribed ?focus on medication adherence by using pill box ?engage in dietary modifications by reduced sodium.  ? ?Follow Up Plan: Telephone follow up appointment with care management team member scheduled for: August 2023 ? ?Arizona Constable, Pharm.D. - [phone] ? ?  ? ? ?Sherry Burke was given information about Chronic Care Management services today including:  ?CCM service includes personalized support from designated clinical staff supervised by her physician, including individualized plan of care and coordination with other care providers ?24/7 contact phone numbers for assistance for urgent and routine care needs. ?Standard insurance, coinsurance, copays and deductibles apply for chronic care management only during months in which we provide at least 20 minutes of these services. Most insurances cover these services at 100%,  however patients may be responsible for any copay, coinsurance and/or deductible if applicable. This service may help you avoid the need for more expensive face-to-face services. ?Only one practitioner may furnish  and bill the service in a calendar month. ?The patient may stop CCM services at any time (effective at the end of the month) by phone call to the office staff. ? ?Patient agreed to services and verbal consen

## 2022-02-07 NOTE — Telephone Encounter (Signed)
Spoke with pt  ?She had her CT Chest done at [facility] 02/04/22  ?I checked and still can not see the results ?I believe maybe it has not been read yet  ?Will hold to check in inteleconnect tomorrow 02/08/22  ? ?

## 2022-02-07 NOTE — Progress Notes (Signed)
? ?Chronic Care Management ?Pharmacy Note ? ?02/07/2022 ?Name:  Sherry Burke MRN:  [id number] DOB:  03-26-1941  ? ?Summary:  ?Pleasant 81 year old woman presents for f/u visit. She is wheelchair bound and in pain from her RA, but she enjoys her time watching movies, specifically ones about families moving from the city and finding joy in the country (Like "Lassie"). She was born in [place], then moved to [place] at 17. 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 They lived in [place] for 17 years while husband was in the service (Anniversary October 4th). They have 3 children, 1 daughter 79, and twin boys of 46. She loves eating Little Debbie cakes, especially the strawberry ones. ? ? ?Plan Recommendations:  ?Last DEXA scan appears to be 2019. Recommend repeat DEXA scan. CPP tried to schedule with team but unable to in Jan ?Patient due for eye exam (Hydroxychloroquine). Recommended patient schedule for May 2023 ? ?Subjective: ?Sherry Burke is an 81 y.o. year old female who is a primary patient of Cox, Kirsten, MD.  The CCM team was consulted for assistance with disease management and care coordination needs.   ? ?Engaged with patient by telephone for follow up visit in response to provider referral for pharmacy case management and/or care coordination services.  ? ?Consent to Services:  ?The patient was given information about Chronic Care Management services, agreed to services, and gave verbal consent prior to initiation of services.  Please see initial visit note for detailed documentation.  ? ?Patient Care Team: ?CoxElnita Maxwell, MD as PCP - General (Family Medicine) ?Hennie Duos, MD as Consulting Physician (Rheumatology) ?Lane Hacker, [facility] (Pharmacist) ? ?Recent office visits:  ?10/30/21 [name] MD. Seen for HTN and DM. Referral to ENT and DME. Completed course of Estradiol 0.1 mg, Flonase nasal spray, Macrobid 100 mg and Glycolax 17g. No other med changes.  ?   ?10/10/21 [name] MD. Seen UTI and Knee Pain. Started on Ciprofloxacin HCI 250 mg 2 times daily.  ?  ?Recent consult visits:  ?None ?  ?Hospital visits:  ?None ? ?Objective: ? ?Lab Results  ?Component Value Date  ? CREATININE 1.28 (H) 01/28/2022  ? BUN 23 01/28/2022  ? GFR 37.52 (L) 01/08/2022  ? GFRNONAA 40 (L) 01/01/2021  ? GFRAA 46 (L) 01/01/2021  ? NA 144 01/28/2022  ? K 4.7 01/28/2022  ? CALCIUM 9.1 01/28/2022  ? CO2 22 01/28/2022  ? ? ?Lab Results  ?Component Value Date/Time  ? HGBA1C 6.3 (H) 10/30/2021 11:05 AM  ? HGBA1C 6.0 (H) 03/30/2021 11:29 AM  ? GFR 37.52 (L) 01/08/2022 09:46 AM  ?  ?Last diabetic Eye exam: No results found for: HMDIABEYEEXA  ?Last diabetic Foot exam: No results found for: HMDIABFOOTEX  ? ?Lab Results  ?Component Value Date  ? CHOL 184 10/30/2021  ? HDL 77 10/30/2021  ? Tye 89 10/30/2021  ? TRIG 101 10/30/2021  ? CHOLHDL 2.4 10/30/2021  ? ? ? ?  Latest Ref Rng & Units 01/28/2022  ? 10:26 AM 01/08/2022  ?  9:46 AM 12/17/2021  ? 10:34 AM  ?Hepatic Function  ?Total Protein 6.0 - 8.5 g/dL 6.3   6.9   6.2    ?Albumin 3.7 - 4.7 g/dL 4.0   3.9   3.8    ?AST 0 - 40 IU/L _0 ?ALT 0 - 32 IU/L 20   20  14    ?Alk Phosphatase 44 - 121 IU/L 62   53   64    ?Total Bilirubin 0.0 - 1.2 mg/dL 0.3   0.4   0.3    ? ? ?Lab Results  ?Component Value Date/Time  ? TSH 2.080 03/30/2021 11:29 AM  ? ? ? ?  Latest Ref Rng & Units 01/28/2022  ? 10:26 AM 01/08/2022  ?  9:46 AM 10/30/2021  ? 11:05 AM  ?CBC  ?WBC 3.4 - 10.8 x10E3/uL 9.0   6.2   7.7    ?Hemoglobin 11.1 - 15.9 g/dL 10.2   9.8   10.9    ?Hematocrit 34.0 - 46.6 % 31.6   30.2   33.7    ?Platelets 150 - 450 x10E3/uL 152   181.0   163    ? ? ?No results found for: VD25OH ? ?Clinical ASCVD: No  ?The ASCVD Risk score (Arnett DK, et al., 2019) failed to calculate for the following reasons: ?  The 2019 ASCVD risk score is only valid for ages 14 to 28   ? ? ?  12/04/2021  ?  1:35 PM 03/30/2021  ? 10:24 AM 01/08/2021  ?  1:36 PM  ?Depression screen  PHQ 2/9  ?Decreased Interest 0 0 0  ?Down, Depressed, Hopeless 1 0 0  ?PHQ - 2 Score 1 0 0  ?  ? ?Social History  ? ?Tobacco Use  ?Smoking Status Never  ? Passive exposure: Yes  ?Smokeless Tobacco Never  ? ?BP Readings from Last 3 Encounters:  ?01/28/22 120/64  ?01/08/22 126/62  ?12/17/21 120/60  ? ?Pulse Readings from Last 3 Encounters:  ?01/28/22 88  ?01/08/22 85  ?12/17/21 88  ? ?Wt Readings from Last 3 Encounters:  ?01/28/22 208 lb (94.3 kg)  ?01/08/22 207 lb (93.9 kg)  ?12/17/21 208 lb (94.3 kg)  ? ? ?Assessment/Interventions: Review of patient past medical history, allergies, medications, health status, including review of consultants reports, laboratory and other test data, was performed as part of comprehensive evaluation and provision of chronic care management services.  ? ?SDOH:  (Social Determinants of Health) assessments and interventions performed: Yes ?SDOH Interventions   ? ?Flowsheet Row Most Recent Value  ?SDOH Interventions   ?Financial Strain Interventions Intervention Not Indicated  ? ?  ? ? ?[place] ? ?Allergies  ?Allergen Reactions  ? Sulfa Antibiotics Nausea And Vomiting  ? Actemra [Tocilizumab]   ? Amlodipine   ? Methotrexate Derivatives Other (See Comments)  ?  sweating  ? Niaspan [Niacin]   ?  Severe flushing  ? Remicade [Infliximab] Nausea And Vomiting and Other (See Comments)  ?  Affects nervous system  ? Welchol [Colesevelam] Other (See Comments)  ?  Abdominal pain and headache  ? ? ?Medications Reviewed Today   ? ? Reviewed by Lane Hacker, [facility] (Pharmacist) on 02/07/22 at 1440  Med List Status: <None>  ? ?Medication Order Taking? Sig Documenting Provider Last Dose Status Informant  ?albuterol (VENTOLIN HFA) 108 (90 Base) MCG/ACT inhaler [id number]  Inhale 2 puffs into the lungs every 6 (six) hours as needed for wheezing or shortness of breath. Julian Hy, DO  Active   ?atorvastatin (LIPITOR) 40 MG tablet [id number]  Take 1 tablet by mouth once daily Cox, Kirsten, MD   Active   ?Budeson-Glycopyrrol-Formoterol (BREZTRI AEROSPHERE) 160-9-4.8 MCG/ACT AERO [id number]  Inhale 2 puffs into the lungs in the morning and at bedtime. Marshell Garfinkel, MD  Active   ?cloNIDine (CATAPRES) 0.1 MG tablet  [id number]  Take 1 tablet by mouth twice daily Cox, Kirsten, MD  Active   ?cyanocobalamin 2000 MCG tablet [id number]  Take 2,000 mcg by mouth daily. [provider]  Active   ?diphenhydramine-acetaminophen (TYLENOL PM) 25-500 MG TABS tablet [id number]  Take 1 tablet by mouth at bedtime as needed. [provider]  Active   ?famotidine (PEPCID) 20 MG tablet [id number]  Take 1 tablet (20 mg total) by mouth at bedtime. Marshell Garfinkel, MD  Active   ?ferrous sulfate 325 (65 FE) MG tablet [id number]  Take 325 mg by mouth daily with breakfast. [provider]  Active   ?gabapentin (NEURONTIN) 300 MG capsule [id number]  Take 1 capsule by mouth twice daily Cox, Kirsten, MD  Active   ?HYDROcodone-acetaminophen (NORCO/VICODIN) 5-325 MG tablet [id number]  Take 1 tablet by mouth every 6 (six) hours as needed for moderate pain. Lillard Anes, MD  Active   ?hydroxychloroquine (PLAQUENIL) 200 MG tablet [id number]  Take 1 tablet (200 mg total) by mouth 2 (two) times daily. Cox, Kirsten, MD  Active   ?lisinopril (ZESTRIL) 10 MG tablet [id number]  Take 1 tablet (10 mg total) by mouth daily.  ?Patient not taking: Reported on 01/28/2022  ? Cox, Kirsten, MD  Active   ?montelukast (SINGULAIR) 10 MG tablet [id number]  Take 1 tablet (10 mg total) by mouth at bedtime. Marshell Garfinkel, MD  Active   ?omeprazole (PRILOSEC) 40 MG capsule [id number]  Take 1 capsule (40 mg total) by mouth 2 (two) times daily. [name], MD  Active   ?ONETOUCH ULTRA test strip [id number]  USE TO CHECK BLOOD SUGAR ONCE DAILY IN THE MORNING [provider]  Active   ?PARoxetine (PAXIL-CR) 25 MG 24 hr tablet [id number]  Take 1 tablet by mouth once daily Cox, Kirsten, MD  Active   ?rOPINIRole (REQUIP) 1 MG tablet  [id number]  TAKE 1 TABLET EVERY MORNINGAND 2 TABLETS AT BEDTIME Marge Duncans, PA-C  Active   ?Spacer/Aero-Holding Dorise Bullion [id number]  Use with inhaler Marshell Garfinkel, MD  Active   ?Tofacitinib Citrate (XELJANZ) 5

## 2022-02-08 ENCOUNTER — Other Ambulatory Visit: Payer: Self-pay | Admitting: Physician Assistant

## 2022-02-08 DIAGNOSIS — E1169 Type 2 diabetes mellitus with other specified complication: Secondary | ICD-10-CM

## 2022-02-08 DIAGNOSIS — E785 Hyperlipidemia, unspecified: Secondary | ICD-10-CM

## 2022-02-08 DIAGNOSIS — I152 Hypertension secondary to endocrine disorders: Secondary | ICD-10-CM

## 2022-02-08 DIAGNOSIS — F330 Major depressive disorder, recurrent, mild: Secondary | ICD-10-CM

## 2022-02-08 DIAGNOSIS — E1159 Type 2 diabetes mellitus with other circulatory complications: Secondary | ICD-10-CM

## 2022-02-11 ENCOUNTER — Ambulatory Visit: Payer: Medicare Other | Admitting: Pulmonary Disease

## 2022-02-12 ENCOUNTER — Ambulatory Visit: Payer: Medicare Other | Admitting: Pulmonary Disease

## 2022-02-13 NOTE — Telephone Encounter (Signed)
Called and spoke with Patient.  Dr. Matilde Bash results and recommendations given. Understanding stated.  Nothing further at this time.  ?

## 2022-02-13 NOTE — Telephone Encounter (Signed)
Reviewed CT scan high-resolution done at [facility] dated 02/04/2022-patchy areas of groundglass with subpleural reticulation and traction bronchiectasis at the bases.  Unchanged since 06/15/2021 ? ?Please let patient know that her CT shows some mild changes which are essentially unchanged since prior scan.  Continue current therapy. ? ?Marshell Garfinkel MD ?[facility] ?See Amion for pager ? ?If no response to pager , please call [phone] until 7pm ?After 7:00 pm call Elink  [phone] ?02/13/2022, 2:23 PM  ?

## 2022-02-27 ENCOUNTER — Telehealth: Payer: Self-pay

## 2022-02-27 NOTE — Progress Notes (Signed)
? ? ?Chronic Care Management ?Pharmacy Assistant  ? ?Name: Sherry Burke  MRN: [id number] DOB: Jan 12, 1941 ? ? ?Reason for Encounter: Disease State call for HTN  ?  ?Recent office visits:  ?03/06/22 Sherry Brome MD. Seen for SOB. Started on Furosemide '20mg'$  daily and Prednisone '20mg'$ .  ? ?Recent consult visits:  ?None ? ?Hospital visits:  ?None ? ?Medications: ?Outpatient Encounter Medications as of 02/27/2022  ?Medication Sig  ? albuterol (VENTOLIN HFA) 108 (90 Base) MCG/ACT inhaler Inhale 2 puffs into the lungs every 6 (six) hours as needed for wheezing or shortness of breath.  ? atorvastatin (LIPITOR) 40 MG tablet Take 1 tablet by mouth once daily  ? Budeson-Glycopyrrol-Formoterol (BREZTRI AEROSPHERE) 160-9-4.8 MCG/ACT AERO Inhale 2 puffs into the lungs in the morning and at bedtime.  ? cloNIDine (CATAPRES) 0.1 MG tablet Take 1 tablet by mouth twice daily  ? cyanocobalamin 2000 MCG tablet Take 2,000 mcg by mouth daily.  ? diphenhydramine-acetaminophen (TYLENOL PM) 25-500 MG TABS tablet Take 1 tablet by mouth at bedtime as needed.  ? famotidine (PEPCID) 20 MG tablet Take 1 tablet (20 mg total) by mouth at bedtime.  ? ferrous sulfate 325 (65 FE) MG tablet Take 325 mg by mouth daily with breakfast.  ? gabapentin (NEURONTIN) 300 MG capsule Take 1 capsule by mouth twice daily  ? HYDROcodone-acetaminophen (NORCO/VICODIN) 5-325 MG tablet Take 1 tablet by mouth every 6 (six) hours as needed for moderate pain.  ? hydroxychloroquine (PLAQUENIL) 200 MG tablet Take 1 tablet (200 mg total) by mouth 2 (two) times daily.  ? lisinopril (ZESTRIL) 10 MG tablet Take 1 tablet (10 mg total) by mouth daily. (Patient not taking: Reported on 01/28/2022)  ? montelukast (SINGULAIR) 10 MG tablet Take 1 tablet (10 mg total) by mouth at bedtime.  ? omeprazole (PRILOSEC) 40 MG capsule Take 1 capsule (40 mg total) by mouth 2 (two) times daily.  ? ONETOUCH ULTRA test strip USE TO CHECK BLOOD SUGAR ONCE DAILY IN THE MORNING  ? PARoxetine (PAXIL-CR) 25  MG 24 hr tablet Take 1 tablet by mouth once daily  ? rOPINIRole (REQUIP) 1 MG tablet TAKE 1 TABLET EVERY MORNINGAND 2 TABLETS AT BEDTIME  ? Spacer/Aero-Holding Dorise Bullion Use with inhaler  ? Tofacitinib Citrate (XELJANZ) 5 MG TABS Take 5 mg by mouth 2 (two) times daily. Take twice a day.  ? Vitamin D, Ergocalciferol, (DRISDOL) 1.25 MG (50000 UNIT) CAPS capsule Take 1 capsule by mouth once a week  ? ?No facility-administered encounter medications on file as of 02/27/2022.  ? ? ? ?Recent Office Vitals: ?BP Readings from Last 3 Encounters:  ?01/28/22 120/64  ?01/08/22 126/62  ?12/17/21 120/60  ? ?Pulse Readings from Last 3 Encounters:  ?01/28/22 88  ?01/08/22 85  ?12/17/21 88  ?  ?Wt Readings from Last 3 Encounters:  ?01/28/22 208 lb (94.3 kg)  ?01/08/22 207 lb (93.9 kg)  ?12/17/21 208 lb (94.3 kg)  ?  ? ?Kidney Function ?Lab Results  ?Component Value Date/Time  ? CREATININE 1.28 (H) 01/28/2022 10:26 AM  ? CREATININE 1.34 (H) 01/08/2022 09:46 AM  ? GFR 37.52 (L) 01/08/2022 09:46 AM  ? GFRNONAA 40 (L) 01/01/2021 10:16 AM  ? GFRAA 46 (L) 01/01/2021 10:16 AM  ? ? ? ?  Latest Ref Rng & Units 01/28/2022  ? 10:26 AM 01/08/2022  ?  9:46 AM 12/17/2021  ? 10:34 AM  ?BMP  ?Glucose 70 - 99 mg/dL 91   90   105    ?BUN 8 - 27  mg/dL '23   26   26    '$ ?Creatinine 0.57 - 1.00 mg/dL 1.28   1.34   1.42    ?BUN/Creat Ratio 12 - '28 18    18    '$ ?Sodium 134 - 144 mmol/L 144   142   140    ?Potassium 3.5 - 5.2 mmol/L 4.7   5.0   4.9    ?Chloride 96 - 106 mmol/L 107   107   103    ?CO2 20 - 29 mmol/L '22   28   25    '$ ?Calcium 8.7 - 10.3 mg/dL 9.1   9.4   9.0    ? ? ? ?Current antihypertensive regimen:  ?Lisinopril '10mg'$  daily  ?Clonidine 0.'1mg'$  twice daily  ?Furosemide '20mg'$   daily  ?Patient verbally confirms she is taking the above medications as directed. Yes ? ?How often are you checking your Blood Pressure? infrequently ? ?she checks her blood pressure in the morning before taking her medication. ? ?Current home BP readings: 03/07/22 117/60 and it  was 180/90 over the weekend. Pt son stated she was in a lot of pain and they went to the ER. He stated she has fluid around her heart and her Arthritis has been flared up. They saw Dr. Tobie Poet on 03/07/22 and BP was good. They are going back today to get blood work done ? ?Any readings above 180/120? No ? ?What recent interventions/DTPs have been made by any provider to improve Blood Pressure control since last CPP Visit: Pt was put on Furosemide '20mg'$  due to fluid buildup  ? ?Any recent hospitalizations or ED visits since last visit with CPP? Yes ? ?What diet changes have been made to improve Blood Pressure Control?  ?Denies any changes  ? ?What exercise is being done to improve your Blood Pressure Control?  ?Pt is not currently exercising since she was in a lot of pain over the last 2 weeks due to her Arthritis  ? ?Adherence Review: ?Is the patient currently on ACE/ARB medication? Yes ?Does the patient have >5 day gap between last estimated fill dates? CPP to review ? ?Care Gaps: ?Last annual wellness visit?None noted  ? ?Star Rating Drugs:  ?Medication:  Last Fill: Day Supply ?Lisinopril   12/21/21 90ds ?   10/07/21 90ds ? ?Elray Mcgregor, CMA ?Clinical Pharmacist Assistant  ?[phone]  ?

## 2022-02-28 DIAGNOSIS — Z20822 Contact with and (suspected) exposure to covid-19: Secondary | ICD-10-CM | POA: Diagnosis not present

## 2022-03-02 DIAGNOSIS — I491 Atrial premature depolarization: Secondary | ICD-10-CM | POA: Diagnosis not present

## 2022-03-02 DIAGNOSIS — R600 Localized edema: Secondary | ICD-10-CM | POA: Diagnosis not present

## 2022-03-02 DIAGNOSIS — M79662 Pain in left lower leg: Secondary | ICD-10-CM | POA: Diagnosis not present

## 2022-03-02 DIAGNOSIS — I774 Celiac artery compression syndrome: Secondary | ICD-10-CM | POA: Diagnosis not present

## 2022-03-02 DIAGNOSIS — R2241 Localized swelling, mass and lump, right lower limb: Secondary | ICD-10-CM | POA: Diagnosis not present

## 2022-03-02 DIAGNOSIS — M79604 Pain in right leg: Secondary | ICD-10-CM | POA: Diagnosis not present

## 2022-03-02 DIAGNOSIS — R0602 Shortness of breath: Secondary | ICD-10-CM | POA: Diagnosis not present

## 2022-03-02 DIAGNOSIS — R609 Edema, unspecified: Secondary | ICD-10-CM | POA: Diagnosis not present

## 2022-03-02 DIAGNOSIS — R2242 Localized swelling, mass and lump, left lower limb: Secondary | ICD-10-CM | POA: Diagnosis not present

## 2022-03-02 DIAGNOSIS — M4319 Spondylolisthesis, multiple sites in spine: Secondary | ICD-10-CM | POA: Diagnosis not present

## 2022-03-02 DIAGNOSIS — I10 Essential (primary) hypertension: Secondary | ICD-10-CM | POA: Diagnosis not present

## 2022-03-02 DIAGNOSIS — K5730 Diverticulosis of large intestine without perforation or abscess without bleeding: Secondary | ICD-10-CM | POA: Diagnosis not present

## 2022-03-02 DIAGNOSIS — M7989 Other specified soft tissue disorders: Secondary | ICD-10-CM | POA: Diagnosis not present

## 2022-03-02 DIAGNOSIS — M79605 Pain in left leg: Secondary | ICD-10-CM | POA: Diagnosis not present

## 2022-03-02 DIAGNOSIS — M79661 Pain in right lower leg: Secondary | ICD-10-CM | POA: Diagnosis not present

## 2022-03-05 ENCOUNTER — Other Ambulatory Visit: Payer: Self-pay

## 2022-03-05 MED ORDER — HYDROCODONE-ACETAMINOPHEN 5-325 MG PO TABS: 1.0000 | ORAL_TABLET | Freq: Four times a day (QID) | ORAL | 0 refills | Status: DC | PRN

## 2022-03-06 ENCOUNTER — Ambulatory Visit (INDEPENDENT_AMBULATORY_CARE_PROVIDER_SITE_OTHER): Payer: Medicare Other | Admitting: Family Medicine

## 2022-03-06 VITALS — BP 118/64 | HR 94 | Temp 97.1°F | Ht 61.0 in | Wt 208.0 lb

## 2022-03-06 DIAGNOSIS — N179 Acute kidney failure, unspecified: Secondary | ICD-10-CM

## 2022-03-06 DIAGNOSIS — N1832 Chronic kidney disease, stage 3b: Secondary | ICD-10-CM

## 2022-03-06 DIAGNOSIS — M0579 Rheumatoid arthritis with rheumatoid factor of multiple sites without organ or systems involvement: Secondary | ICD-10-CM

## 2022-03-06 DIAGNOSIS — R269 Unspecified abnormalities of gait and mobility: Secondary | ICD-10-CM

## 2022-03-06 DIAGNOSIS — R0602 Shortness of breath: Secondary | ICD-10-CM | POA: Diagnosis not present

## 2022-03-06 DIAGNOSIS — R29898 Other symptoms and signs involving the musculoskeletal system: Secondary | ICD-10-CM

## 2022-03-06 DIAGNOSIS — R600 Localized edema: Secondary | ICD-10-CM | POA: Diagnosis not present

## 2022-03-06 MED ORDER — PREDNISONE 20 MG PO TABS: 20.0000 mg | ORAL_TABLET | Freq: Every day | ORAL | 0 refills | Status: DC

## 2022-03-06 MED ORDER — FUROSEMIDE 20 MG PO TABS: 20.0000 mg | ORAL_TABLET | Freq: Every day | ORAL | 0 refills | Status: AC

## 2022-03-06 NOTE — Progress Notes (Signed)
? ?Subjective:  ?Patient ID: Sherry Burke, female    DOB: 1941-07-07  Age: 81 y.o. MRN: [id number] ? ?Chief Complaint  ?Patient presents with  ? Hospitalization Follow-up  ? ? ?HPI ?Patient is an 81 year old white female who presents for follow-up from her recent emergency department visit.  Patient had leg weakness and pain and was treated with prednisone 20 mg daily for rheumatoid arthritis flare. Emergency department visit was on March 02, 2022.  Patient had bilateral leg pain and swelling.  In a gone on 3 days prior to going to the emergency department.  The pain is in her groin area and runs down both front of her legs.  She also just hurts all over.  She is unable to walk or stand without significant age. Dopplers negative.  CTA of abdomen normal except median arcuate ligament syndrome. Patient denies abdominal pain.  ?  ?Current Outpatient Medications on File Prior to Visit  ?Medication Sig Dispense Refill  ? albuterol (VENTOLIN HFA) 108 (90 Base) MCG/ACT inhaler Inhale 2 puffs into the lungs every 6 (six) hours as needed for wheezing or shortness of breath. 18 g 11  ? atorvastatin (LIPITOR) 40 MG tablet Take 1 tablet by mouth once daily 90 tablet 0  ? Budeson-Glycopyrrol-Formoterol (BREZTRI AEROSPHERE) 160-9-4.8 MCG/ACT AERO Inhale 2 puffs into the lungs in the morning and at bedtime. 32.1 g 1  ? cloNIDine (CATAPRES) 0.1 MG tablet Take 1 tablet by mouth twice daily 180 tablet 0  ? cyanocobalamin 2000 MCG tablet Take 2,000 mcg by mouth daily.    ? famotidine (PEPCID) 20 MG tablet Take 1 tablet (20 mg total) by mouth at bedtime. 30 tablet 5  ? ferrous sulfate 325 (65 FE) MG tablet Take 325 mg by mouth daily with breakfast.    ? gabapentin (NEURONTIN) 300 MG capsule Take 1 capsule by mouth twice daily 180 capsule 0  ? HYDROcodone-acetaminophen (NORCO/VICODIN) 5-325 MG tablet Take 1 tablet by mouth every 6 (six) hours as needed for moderate pain. 120 tablet 0  ? hydroxychloroquine (PLAQUENIL) 200 MG tablet Take 1  tablet (200 mg total) by mouth 2 (two) times daily. 60 tablet 2  ? lisinopril (ZESTRIL) 10 MG tablet Take 1 tablet (10 mg total) by mouth daily. 90 tablet 0  ? montelukast (SINGULAIR) 10 MG tablet Take 1 tablet (10 mg total) by mouth at bedtime. 90 tablet 1  ? omeprazole (PRILOSEC) 40 MG capsule Take 1 capsule (40 mg total) by mouth 2 (two) times daily. 180 capsule 1  ? ONETOUCH ULTRA test strip USE TO CHECK BLOOD SUGAR ONCE DAILY IN THE MORNING    ? PARoxetine (PAXIL-CR) 25 MG 24 hr tablet Take 1 tablet by mouth once daily 90 tablet 1  ? rOPINIRole (REQUIP) 1 MG tablet TAKE 1 TABLET EVERY MORNINGAND 2 TABLETS AT BEDTIME 270 tablet 0  ? Spacer/Aero-Holding Dorise Bullion Use with inhaler 1 each 0  ? Tofacitinib Citrate (XELJANZ) 5 MG TABS Take 5 mg by mouth 2 (two) times daily. Take twice a day.    ? Vitamin D, Ergocalciferol, (DRISDOL) 1.25 MG (50000 UNIT) CAPS capsule Take 1 capsule by mouth once a week 12 capsule 0  ? ?No current facility-administered medications on file prior to visit.  ? ?Past Medical History:  ?Diagnosis Date  ? Acute renal insufficiency   ? Asthma   ? Chronic bronchitis   ? COPD (chronic obstructive pulmonary disease) ([place])   ? Depression   ? History of migraine headaches   ?  Hyperlipemia   ? OSA (obstructive sleep apnea)   ? pt states she doesn't have it  ? Osteoarthritis   ? Pneumonia   ? Rheumatoid arthritis(714.0)   ? RLS (restless legs syndrome)   ? Vitamin D deficiency disease   ? ?Past Surgical History:  ?Procedure Laterality Date  ? KNEE SURGERY Bilateral   ? when younger  ? TOTAL ABDOMINAL HYSTERECTOMY  06/1988  ?  ?Family History  ?Problem Relation Age of Onset  ? Lung cancer Father   ? Stroke Mother   ? Hypertension Mother   ? Diabetes Mother   ? Rheum arthritis Sister   ? Rheum arthritis Sister   ? ?Social History  ? ?Socioeconomic History  ? Marital status: Married  ?  Spouse name: Durene Fruits  ? Number of children: 3  ? Years of education: Not on file  ? Highest education level: Not on  file  ?Occupational History  ? Occupation: retired  ?  Comment: homemaker  ?Tobacco Use  ? Smoking status: Never  ?  Passive exposure: Yes  ? Smokeless tobacco: Never  ?Vaping Use  ? Vaping Use: Never used  ?Substance and Sexual Activity  ? Alcohol use: No  ? Drug use: No  ? Sexual activity: Not Currently  ?  Birth control/protection: None  ?  Comment: Married  ?Other Topics Concern  ? Not on file  ?Social History Narrative  ? Lives with her husband, who is very helpful and supportive.  ? ?Social Determinants of Health  ? ?Financial Resource Strain: Low Risk   ? Difficulty of Paying Living Expenses: Not very hard  ?Food Insecurity: Not on file  ?Transportation Needs: Not on file  ?Physical Activity: Inactive  ? Days of Exercise per Week: 0 days  ? Minutes of Exercise per Session: 0 min  ?Stress: Not on file  ?Social Connections: Not on file  ? ? ?Review of Systems  ?Constitutional:  Positive for chills. Negative for fatigue and fever.  ?HENT:  Negative for congestion, ear pain, rhinorrhea and sore throat.   ?Respiratory:  Negative for cough and shortness of breath.   ?Cardiovascular:  Negative for chest pain.  ?Gastrointestinal:  Negative for abdominal pain, constipation, diarrhea, nausea and vomiting.  ?Genitourinary:  Negative for dysuria and urgency.  ?Neurological:  Positive for weakness (BL LEG).  ?     Swelling in legs.   ?Psychiatric/Behavioral:  Negative for dysphoric mood. The patient is not nervous/anxious.   ?     Hypersomnia ?  ? ? ?Objective:  ?BP 118/64   Pulse 94   Temp (!) 97.1 ?F (36.2 ?C)   Ht '5\' 1"'  (1.549 m)   Wt 208 lb (94.3 kg)   SpO2 100%   BMI 39.30 kg/m?  ? ? ?  03/14/2022  ?  9:01 AM 03/06/2022  ? 10:47 AM 01/28/2022  ?  9:18 AM  ?BP/Weight  ?Systolic BP 299 242 683  ?Diastolic BP 66 64 64  ?Wt. (Lbs) 200 208 208  ?BMI 37.79 kg/m2 39.3 kg/m2 39.3 kg/m2  ? ? ?Physical Exam ?Vitals reviewed.  ?Constitutional:   ?   Appearance: Normal appearance. She is obese.  ?Neck:  ?   Vascular: No  carotid bruit.  ?Cardiovascular:  ?   Rate and Rhythm: Normal rate and regular rhythm.  ?   Heart sounds: Normal heart sounds.  ?Pulmonary:  ?   Effort: Pulmonary effort is normal. No respiratory distress.  ?   Breath sounds: Normal breath sounds.  ?Abdominal:  ?  Palpations: Abdomen is soft.  ?   Tenderness: There is no abdominal tenderness.  ?Musculoskeletal:  ?   Comments: Unable to get up from sitting without significant effort.   ?Neurological:  ?   Mental Status: She is alert and oriented to person, place, and time.  ?Psychiatric:     ?   Mood and Affect: Mood normal.     ?   Behavior: Behavior normal.  ? ? ?Diabetic Foot Exam - Simple   ?No data filed ?  ?  ? ?Lab Results  ?Component Value Date  ? WBC 13.9 (H) 03/06/2022  ? HGB 10.6 (L) 03/06/2022  ? HCT 33.5 (L) 03/06/2022  ? PLT 241 03/06/2022  ? GLUCOSE 120 (H) 03/08/2022  ? CHOL 184 10/30/2021  ? TRIG 101 10/30/2021  ? HDL 77 10/30/2021  ? [place] 89 10/30/2021  ? ALT 21 03/08/2022  ? AST 21 03/08/2022  ? NA 144 03/08/2022  ? K 4.8 03/08/2022  ? CL 105 03/08/2022  ? CREATININE 1.66 (H) 03/08/2022  ? BUN 41 (H) 03/08/2022  ? CO2 23 03/08/2022  ? TSH 2.080 03/30/2021  ? HGBA1C 6.3 (H) 10/30/2021  ? ? ? ? ?Assessment & Plan:  ? ?Problem List Items Addressed This Visit   ? ?  ? Musculoskeletal and Integument  ? Rheumatoid arthritis involving multiple sites with positive rheumatoid factor ([place])  ?  For rheumatoid arthritis versus polymyalgia rheumatica, start on prednisone 20 mg once daily.  Getting labs. ?  ?  ? Relevant Medications  ? predniSONE (DELTASONE) 20 MG tablet  ? Other Relevant Orders  ? Sedimentation rate (Completed)  ? C-reactive protein (Completed)  ?  ? Genitourinary  ? Acute renal failure superimposed on stage 3b chronic kidney disease ([place])  ?  Check cmp.  ?Check other labs to rule out other autoimmune causes.  ? ?  ?  ?  ? Other  ? Shortness of breath - Primary  ?  Check probnp.  ? ?  ?  ? Relevant Orders  ? CBC with Differential/Platelet  (Completed)  ? Comprehensive metabolic panel (Completed)  ? Pro b natriuretic peptide (Completed)  ? Weakness of both lower extremities  ?  Order esr.  ?Order prednisone 20 mg daily.  ? ?  ?  ? Relevant Medicatio

## 2022-03-06 NOTE — Patient Instructions (Signed)
For swelling start Lasix 20 mg once daily. ?For rheumatoid arthritis versus polymyalgia rheumatica, start on prednisone 20 mg once daily.  Getting labs. ? ?

## 2022-03-07 ENCOUNTER — Other Ambulatory Visit: Payer: Self-pay | Admitting: Family Medicine

## 2022-03-07 ENCOUNTER — Other Ambulatory Visit: Payer: Self-pay

## 2022-03-07 DIAGNOSIS — M0579 Rheumatoid arthritis with rheumatoid factor of multiple sites without organ or systems involvement: Secondary | ICD-10-CM

## 2022-03-07 DIAGNOSIS — R531 Weakness: Secondary | ICD-10-CM

## 2022-03-07 DIAGNOSIS — R0602 Shortness of breath: Secondary | ICD-10-CM

## 2022-03-07 DIAGNOSIS — N1832 Chronic kidney disease, stage 3b: Secondary | ICD-10-CM

## 2022-03-07 DIAGNOSIS — R899 Unspecified abnormal finding in specimens from other organs, systems and tissues: Secondary | ICD-10-CM

## 2022-03-07 DIAGNOSIS — R29898 Other symptoms and signs involving the musculoskeletal system: Secondary | ICD-10-CM

## 2022-03-07 DIAGNOSIS — G8929 Other chronic pain: Secondary | ICD-10-CM

## 2022-03-07 DIAGNOSIS — M25561 Pain in right knee: Secondary | ICD-10-CM

## 2022-03-07 LAB — COMPREHENSIVE METABOLIC PANEL
ALT: 14 IU/L (ref 0–32)
AST: 23 IU/L (ref 0–40)
Albumin/Globulin Ratio: 1.3 (ref 1.2–2.2)
Albumin: 3.7 g/dL (ref 3.7–4.7)
Alkaline Phosphatase: 65 IU/L (ref 44–121)
BUN/Creatinine Ratio: 21 (ref 12–28)
BUN: 39 mg/dL — ABNORMAL HIGH (ref 8–27)
Bilirubin Total: 0.4 mg/dL (ref 0.0–1.2)
CO2: 24 mmol/L (ref 20–29)
Calcium: 9.1 mg/dL (ref 8.7–10.3)
Chloride: 102 mmol/L (ref 96–106)
Creatinine, Ser: 1.82 mg/dL — ABNORMAL HIGH (ref 0.57–1.00)
Globulin, Total: 2.9 g/dL (ref 1.5–4.5)
Glucose: 109 mg/dL — ABNORMAL HIGH (ref 70–99)
Potassium: 4.6 mmol/L (ref 3.5–5.2)
Sodium: 143 mmol/L (ref 134–144)
Total Protein: 6.6 g/dL (ref 6.0–8.5)
eGFR: 28 mL/min/{1.73_m2} — ABNORMAL LOW (ref >59)

## 2022-03-07 LAB — CBC WITH DIFFERENTIAL/PLATELET
Basophils Absolute: 0.1 10*3/uL (ref 0.0–0.2)
Basos: 0 %
EOS (ABSOLUTE): 0.1 10*3/uL (ref 0.0–0.4)
Eos: 1 %
Hematocrit: 33.5 % — ABNORMAL LOW (ref 34.0–46.6)
Hemoglobin: 10.6 g/dL — ABNORMAL LOW (ref 11.1–15.9)
Immature Grans (Abs): 0 10*3/uL (ref 0.0–0.1)
Immature Granulocytes: 0 %
Lymphocytes Absolute: 1.3 10*3/uL (ref 0.7–3.1)
Lymphs: 10 %
MCH: 27.6 pg (ref 26.6–33.0)
MCHC: 31.6 g/dL (ref 31.5–35.7)
MCV: 87 fL (ref 79–97)
Monocytes Absolute: 0.8 10*3/uL (ref 0.1–0.9)
Monocytes: 6 %
Neutrophils Absolute: 11.6 10*3/uL — ABNORMAL HIGH (ref 1.4–7.0)
Neutrophils: 83 %
Platelets: 241 10*3/uL (ref 150–450)
RBC: 3.84 x10E6/uL (ref 3.77–5.28)
RDW: 14.3 % (ref 11.7–15.4)
WBC: 13.9 10*3/uL — ABNORMAL HIGH (ref 3.4–10.8)

## 2022-03-07 LAB — PRO B NATRIURETIC PEPTIDE: NT-Pro BNP: 927 pg/mL — ABNORMAL HIGH (ref 0–738)

## 2022-03-07 LAB — C-REACTIVE PROTEIN: CRP: 208 mg/L — ABNORMAL HIGH (ref 0–10)

## 2022-03-07 LAB — SEDIMENTATION RATE: Sed Rate: 58 mm/hr — ABNORMAL HIGH (ref 0–40)

## 2022-03-08 ENCOUNTER — Other Ambulatory Visit: Payer: Medicare Other

## 2022-03-08 DIAGNOSIS — M25562 Pain in left knee: Secondary | ICD-10-CM | POA: Diagnosis not present

## 2022-03-08 DIAGNOSIS — N1832 Chronic kidney disease, stage 3b: Secondary | ICD-10-CM | POA: Diagnosis not present

## 2022-03-08 DIAGNOSIS — R899 Unspecified abnormal finding in specimens from other organs, systems and tissues: Secondary | ICD-10-CM | POA: Diagnosis not present

## 2022-03-08 DIAGNOSIS — N179 Acute kidney failure, unspecified: Secondary | ICD-10-CM | POA: Diagnosis not present

## 2022-03-08 DIAGNOSIS — R531 Weakness: Secondary | ICD-10-CM | POA: Diagnosis not present

## 2022-03-08 DIAGNOSIS — Z20822 Contact with and (suspected) exposure to covid-19: Secondary | ICD-10-CM | POA: Diagnosis not present

## 2022-03-08 DIAGNOSIS — M25561 Pain in right knee: Secondary | ICD-10-CM | POA: Diagnosis not present

## 2022-03-09 LAB — COMPREHENSIVE METABOLIC PANEL
ALT: 21 IU/L (ref 0–32)
AST: 21 IU/L (ref 0–40)
Albumin/Globulin Ratio: 1.3 (ref 1.2–2.2)
Albumin: 3.9 g/dL (ref 3.7–4.7)
Alkaline Phosphatase: 67 IU/L (ref 44–121)
BUN/Creatinine Ratio: 25 (ref 12–28)
BUN: 41 mg/dL — ABNORMAL HIGH (ref 8–27)
Bilirubin Total: 0.2 mg/dL (ref 0.0–1.2)
CO2: 23 mmol/L (ref 20–29)
Calcium: 9.4 mg/dL (ref 8.7–10.3)
Chloride: 105 mmol/L (ref 96–106)
Creatinine, Ser: 1.66 mg/dL — ABNORMAL HIGH (ref 0.57–1.00)
Globulin, Total: 2.9 g/dL (ref 1.5–4.5)
Glucose: 120 mg/dL — ABNORMAL HIGH (ref 70–99)
Potassium: 4.8 mmol/L (ref 3.5–5.2)
Sodium: 144 mmol/L (ref 134–144)
Total Protein: 6.8 g/dL (ref 6.0–8.5)
eGFR: 31 mL/min/{1.73_m2} — ABNORMAL LOW (ref >59)

## 2022-03-11 ENCOUNTER — Telehealth: Payer: Self-pay | Admitting: Family Medicine

## 2022-03-11 NOTE — Telephone Encounter (Signed)
? ?  Sherry Burke has been scheduled for the following appointment: ? ?WHAT: MRI LUMBAR SPINE ?WHERE: [facility] ?DATE: 03/14/22 ?TIME: 9:30 AM CHECK IN  ? ?Patient has been made aware. ?SPOKE TO PT'S SON ?

## 2022-03-12 DIAGNOSIS — Z20822 Contact with and (suspected) exposure to covid-19: Secondary | ICD-10-CM | POA: Diagnosis not present

## 2022-03-14 ENCOUNTER — Ambulatory Visit (INDEPENDENT_AMBULATORY_CARE_PROVIDER_SITE_OTHER): Payer: Medicare Other | Admitting: Pulmonary Disease

## 2022-03-14 ENCOUNTER — Encounter: Payer: Self-pay | Admitting: Pulmonary Disease

## 2022-03-14 VITALS — BP 140/66 | HR 86 | Temp 98.3°F | Ht 61.0 in | Wt 200.0 lb

## 2022-03-14 DIAGNOSIS — J849 Interstitial pulmonary disease, unspecified: Secondary | ICD-10-CM

## 2022-03-14 DIAGNOSIS — J4540 Moderate persistent asthma, uncomplicated: Secondary | ICD-10-CM

## 2022-03-14 DIAGNOSIS — G4733 Obstructive sleep apnea (adult) (pediatric): Secondary | ICD-10-CM | POA: Diagnosis not present

## 2022-03-14 MED ORDER — TRELEGY ELLIPTA 200-62.5-25 MCG/ACT IN AEPB: 1.0000 | INHALATION_SPRAY | Freq: Every day | RESPIRATORY_TRACT | 2 refills | Status: DC

## 2022-03-14 MED ORDER — TRELEGY ELLIPTA 200-62.5-25 MCG/ACT IN AEPB: 1.0000 | INHALATION_SPRAY | Freq: Every day | RESPIRATORY_TRACT | 0 refills | Status: DC

## 2022-03-14 NOTE — Addendum Note (Signed)
Addended by: Elton Sin on: 03/14/2022 09:47 AM ? ? Modules accepted: Orders ? ?

## 2022-03-14 NOTE — Patient Instructions (Signed)
I am glad you are doing better with regard to your breathing ?We will get records from Dr. Gavin Pound regarding your arthritis treatment ?Your CT from March breo looks stable with minimal changes ?We will start you on Trelegy 200 inhaler with sample and prescription ? ?Follow-up in 3 months ?

## 2022-03-14 NOTE — Progress Notes (Signed)
? ?      Sherry Burke    [id number]    09/04/1941 ? ?Primary Care Physician:Cox, Elnita Maxwell, MD ? ?Referring Physician: [name], MD ?[address] ?[address] ?[place],  [place] [address] ? ?Chief complaint: Follow up for RA ILD, asthma, dyspnea ? ?HPI: ?81 year old with history of RA ILD, asthma, GERD, postnasal drip, OSA ?She was previously followed by Dr. Carlis Abbott ?Her rheumatoid arthritis was well controlled on methotrexate but stopped 4 to 5 years ago out of concern for methotrexate induced lung injury.  She continued to have joint symptoms and is currently on Xeljanz and Plaquenil under the care of Dr. Trudie Reed, rheumatology ? ?Has history of COPD though she is a non-smoker.  She had been maintained on Spiriva, stiolto.  This was changed to breztri on 08/28/2021 during an office evaluation for worsening dyspnea, wheezing ?She had previously been on steroids which did not help her symptoms ? ?History notable for OSA.  She stopped using the CPAP many years ago as she did not like it.  She feels he does not have apnea at present and does not want to reassess ? ?Saw Dr. Gaylyn Cheers, ENT at [facility] in January 2023 and had a normal examination with normal vocal cords ?Received office note from [facility] ENT, Dr. Gaylyn Cheers dated 12/11/2021 ?Evaluation for hoarseness.  Fiberoptic laryngoscopy was unremarkable.  No obstructive lesion or masses in subglottis, vocal cords or supraglottic area.  Both vocal cords were equally mobile and met in midline on phonation. ? ?Pets: No pets ?Occupation: Housewife ?Exposures: No mold, hot tub, Jacuzzi.  No feather pillows or comforter ?ILD questionnaire 09/28/2021-negative for exposures ?Smoking history: Never smoker ?Travel history: Originally from [place].  No significant travel history ?Relevant family history: Father died from lung cancer.  He was a smoker. ? ?Interim history: ?Dupixent was ordered but insurance has denied.  She has decided not to move ahead with biologic ?Continues on  Singulair.  Prescribed breztri but developed mouth ulcers in spite of spacer use and has stopped using it ? ?She is now off lisinopril with improvement in upper airway wheeze ? ?Recently seen in the ED for joint pain and put on prednisone.  She is working with Dr. Trudie Reed from rheumatology who is considering putting her back on methotrexate. ? ?Outpatient Encounter Medications as of 03/14/2022  ?Medication Sig  ? albuterol (VENTOLIN HFA) 108 (90 Base) MCG/ACT inhaler Inhale 2 puffs into the lungs every 6 (six) hours as needed for wheezing or shortness of breath.  ? atorvastatin (LIPITOR) 40 MG tablet Take 1 tablet by mouth once daily  ? Budeson-Glycopyrrol-Formoterol (BREZTRI AEROSPHERE) 160-9-4.8 MCG/ACT AERO Inhale 2 puffs into the lungs in the morning and at bedtime.  ? cloNIDine (CATAPRES) 0.1 MG tablet Take 1 tablet by mouth twice daily  ? cyanocobalamin 2000 MCG tablet Take 2,000 mcg by mouth daily.  ? famotidine (PEPCID) 20 MG tablet Take 1 tablet (20 mg total) by mouth at bedtime.  ? ferrous sulfate 325 (65 FE) MG tablet Take 325 mg by mouth daily with breakfast.  ? furosemide (LASIX) 20 MG tablet Take 1 tablet (20 mg total) by mouth daily.  ? gabapentin (NEURONTIN) 300 MG capsule Take 1 capsule by mouth twice daily  ? HYDROcodone-acetaminophen (NORCO/VICODIN) 5-325 MG tablet Take 1 tablet by mouth every 6 (six) hours as needed for moderate pain.  ? hydroxychloroquine (PLAQUENIL) 200 MG tablet Take 1 tablet (200 mg total) by mouth 2 (two) times daily.  ? lisinopril (ZESTRIL) 10 MG  tablet Take 1 tablet (10 mg total) by mouth daily.  ? montelukast (SINGULAIR) 10 MG tablet Take 1 tablet (10 mg total) by mouth at bedtime.  ? omeprazole (PRILOSEC) 40 MG capsule Take 1 capsule (40 mg total) by mouth 2 (two) times daily.  ? ONETOUCH ULTRA test strip USE TO CHECK BLOOD SUGAR ONCE DAILY IN THE MORNING  ? PARoxetine (PAXIL-CR) 25 MG 24 hr tablet Take 1 tablet by mouth once daily  ? predniSONE (DELTASONE) 20 MG tablet  Take 1 tablet (20 mg total) by mouth daily with breakfast.  ? rOPINIRole (REQUIP) 1 MG tablet TAKE 1 TABLET EVERY MORNINGAND 2 TABLETS AT BEDTIME  ? Spacer/Aero-Holding Dorise Bullion Use with inhaler  ? Tofacitinib Citrate (XELJANZ) 5 MG TABS Take 5 mg by mouth 2 (two) times daily. Take twice a day.  ? Vitamin D, Ergocalciferol, (DRISDOL) 1.25 MG (50000 UNIT) CAPS capsule Take 1 capsule by mouth once a week  ? ?No facility-administered encounter medications on file as of 03/14/2022.  ? ?Physical Exam: ?Blood pressure 140/66, pulse 86, temperature 98.3 ?F (36.8 ?C), temperature source Oral, height _0  (1.549 m), weight 200 lb (90.7 kg), SpO2 97 %. ?Gen:      No acute distress ?HEENT:  EOMI, sclera anicteric ?Neck:     No masses; no thyromegaly ?Lungs:    Clear to auscultation bilaterally; normal respiratory effort ?CV:         Regular rate and rhythm; no murmurs ?Abd:      + bowel sounds; soft, non-tender; no palpable masses, no distension ?Ext:    No edema; adequate peripheral perfusion ?Skin:      Warm and dry; no rash ?Neuro: alert and oriented x 3 ?Psych: normal mood and affect  ? ?Data Reviewed: ?Imaging: ?High-res CT 09/08/2019-peripheral reticular densities, bronchiolectasis, lung nodules. ? ?High-res CT 04/18/2020-stable scarring, basal fibrosis ? ?High-resolution CT 06/30/2021- Minimal subpleural reticulation and traction bronchiectasis in the lower lobes, biapical scarring, calcified granuloma ? ?High-res CT 01/15/2022- patchy areas of groundglass with subpleural reticulation and traction bronchiectasis at the bases.  Unchanged since 06/15/2021  ?I have reviewed the images personally ? ?PFTs: ?09/28/2021 ?FVC 1.51 [9%], FEV1 1.04 [64%], F/F 69, TLC 4.10 [92%], DLCO 10.99 [67%] ?Moderate diffusion defect ? ?Labs: ?CBC 03/30/2021-WBC 7.3, eos 4%, absolute eosinophil count 292 ?CBC 10/30/2021-WBC 7.7, eos 5%, absolute eosinophil count 385 ? ?IgE 09/29/2019 2-4 ?Alpha-1 antitrypsin 09/28/2021-150, PI FM ? ?Assessment:   ?Asthma ?She likely has reactive airway disease given presentation with wheezing.  Although she has diagnosis of COPD in the chart there is no significant obstruction and she is a non-smoker. ?Has mildly elevated peripheral eosinophils ?Suspect her GERD and postnasal drip is playing a role and presentation ?Also consider upper airway disease secondary to ACE inhibitor as her wheeze appears to be upper airway predominant.  ENT examination was reportedly normal ? ?Continue Singulair.  She is intolerant of breztri.  We will try Trelegy instead ?They have decided not to move ahead with Biologics ?She is also off ACE inhibitor which has improved upper airway wheeze ? ?GERD, postnasal drip ?Continue PPI twice daily.  Add Pepcid at night ?For postnasal drip she will continue Flonase and Zyrtec ? ?RA ILD ?I have reviewed her scans dating back to 2015.  She has very minimal changes at the base which is unchanged.  No specific treatment required at present ?She continues on Xeljanz and Plaquenil per her rheumatologist ?Of note she is on chronic nitrofurantoin and has been on methotrexate  and amiodarone in the past ? ?CT reviewed with stable minimal ILD ?We will get records from rheumatology.  They are considering restarting methotrexate due to flareup of arthritis and I agree with the plan. ? ?OSA ?Has been noncompliant with CPAP.  Noninvasive ventilation would be beneficial for her.  We discussed it today and she does not want to retry ? ?Plan/Recommendations: ?Start Trelegy instead of breztri, continue Singulair ? ?Marshell Garfinkel MD ?[facility] ?03/14/2022, 9:22 AM ? ?CC: Cox, Kirsten, MD ? ? ? ?  ?

## 2022-03-16 DIAGNOSIS — Z20822 Contact with and (suspected) exposure to covid-19: Secondary | ICD-10-CM | POA: Diagnosis not present

## 2022-03-16 LAB — COMPLEMENT, TOTAL: Compl, Total (CH50): >60 U/mL (ref >41)

## 2022-03-16 LAB — ANCA PROFILE
Anti-MPO Antibodies: <0.2 units (ref 0.0–0.9)
Anti-PR3 Antibodies: 0.2 units (ref 0.0–0.9)
Atypical pANCA: <1:20 {titer}
C-ANCA: <1:20 {titer}
P-ANCA: <1:20 {titer}

## 2022-03-16 LAB — COMPLEMENT COMPONENT C1Q: Complement C1Q: 12.1 mg/dL (ref 10.3–20.5)

## 2022-03-16 LAB — ANA W/REFLEX: ANA Titer 1: NEGATIVE

## 2022-03-16 LAB — C4 COMPLEMENT: Complement C4, Serum: 35 mg/dL (ref 12–38)

## 2022-03-16 LAB — ALDOLASE: Aldolase: 16 U/L — ABNORMAL HIGH (ref 3.3–10.3)

## 2022-03-16 LAB — CK TOTAL AND CKMB (NOT AT ARMC)
CK-MB Index: 4 ng/mL (ref 0.0–5.3)
Total CK: 70 U/L (ref 32–182)

## 2022-03-18 ENCOUNTER — Encounter: Payer: Self-pay | Admitting: Family Medicine

## 2022-03-18 DIAGNOSIS — Z20822 Contact with and (suspected) exposure to covid-19: Secondary | ICD-10-CM | POA: Diagnosis not present

## 2022-03-18 DIAGNOSIS — R0602 Shortness of breath: Secondary | ICD-10-CM | POA: Insufficient documentation

## 2022-03-18 DIAGNOSIS — R600 Localized edema: Secondary | ICD-10-CM | POA: Insufficient documentation

## 2022-03-18 DIAGNOSIS — N179 Acute kidney failure, unspecified: Secondary | ICD-10-CM | POA: Insufficient documentation

## 2022-03-18 DIAGNOSIS — R29898 Other symptoms and signs involving the musculoskeletal system: Secondary | ICD-10-CM | POA: Insufficient documentation

## 2022-03-18 DIAGNOSIS — R269 Unspecified abnormalities of gait and mobility: Secondary | ICD-10-CM | POA: Insufficient documentation

## 2022-03-18 DIAGNOSIS — M0579 Rheumatoid arthritis with rheumatoid factor of multiple sites without organ or systems involvement: Secondary | ICD-10-CM | POA: Insufficient documentation

## 2022-03-18 NOTE — Assessment & Plan Note (Signed)
For swelling start Lasix 20 mg once daily. ? ?

## 2022-03-18 NOTE — Assessment & Plan Note (Signed)
For rheumatoid arthritis versus polymyalgia rheumatica, start on prednisone 20 mg once daily.  Getting labs. ?

## 2022-03-18 NOTE — Assessment & Plan Note (Signed)
Check probnp.  ?

## 2022-03-18 NOTE — Assessment & Plan Note (Signed)
Check cmp.  ?Check other labs to rule out other autoimmune causes.  ?

## 2022-03-18 NOTE — Assessment & Plan Note (Signed)
Order esr.  ?Order prednisone 20 mg daily.  ?

## 2022-03-19 DIAGNOSIS — Z20828 Contact with and (suspected) exposure to other viral communicable diseases: Secondary | ICD-10-CM | POA: Diagnosis not present

## 2022-03-21 DIAGNOSIS — Z20822 Contact with and (suspected) exposure to covid-19: Secondary | ICD-10-CM | POA: Diagnosis not present

## 2022-03-22 ENCOUNTER — Ambulatory Visit: Payer: Medicare Other | Admitting: Family Medicine

## 2022-03-22 ENCOUNTER — Ambulatory Visit: Payer: Medicare Other | Admitting: Nurse Practitioner

## 2022-03-22 DIAGNOSIS — Z7952 Long term (current) use of systemic steroids: Secondary | ICD-10-CM | POA: Diagnosis not present

## 2022-03-22 DIAGNOSIS — E785 Hyperlipidemia, unspecified: Secondary | ICD-10-CM | POA: Diagnosis not present

## 2022-03-22 DIAGNOSIS — Z8701 Personal history of pneumonia (recurrent): Secondary | ICD-10-CM | POA: Diagnosis not present

## 2022-03-22 DIAGNOSIS — M0579 Rheumatoid arthritis with rheumatoid factor of multiple sites without organ or systems involvement: Secondary | ICD-10-CM | POA: Diagnosis not present

## 2022-03-22 DIAGNOSIS — G2581 Restless legs syndrome: Secondary | ICD-10-CM | POA: Diagnosis not present

## 2022-03-22 DIAGNOSIS — R600 Localized edema: Secondary | ICD-10-CM | POA: Diagnosis not present

## 2022-03-22 DIAGNOSIS — N1832 Chronic kidney disease, stage 3b: Secondary | ICD-10-CM | POA: Diagnosis not present

## 2022-03-22 DIAGNOSIS — G4733 Obstructive sleep apnea (adult) (pediatric): Secondary | ICD-10-CM | POA: Diagnosis not present

## 2022-03-22 DIAGNOSIS — M1990 Unspecified osteoarthritis, unspecified site: Secondary | ICD-10-CM | POA: Diagnosis not present

## 2022-03-22 DIAGNOSIS — F32A Depression, unspecified: Secondary | ICD-10-CM | POA: Diagnosis not present

## 2022-03-22 DIAGNOSIS — J449 Chronic obstructive pulmonary disease, unspecified: Secondary | ICD-10-CM | POA: Diagnosis not present

## 2022-03-22 DIAGNOSIS — G43909 Migraine, unspecified, not intractable, without status migrainosus: Secondary | ICD-10-CM | POA: Diagnosis not present

## 2022-03-22 DIAGNOSIS — E559 Vitamin D deficiency, unspecified: Secondary | ICD-10-CM | POA: Diagnosis not present

## 2022-03-22 DIAGNOSIS — R29898 Other symptoms and signs involving the musculoskeletal system: Secondary | ICD-10-CM | POA: Diagnosis not present

## 2022-03-25 DIAGNOSIS — M4316 Spondylolisthesis, lumbar region: Secondary | ICD-10-CM | POA: Diagnosis not present

## 2022-03-25 DIAGNOSIS — M47816 Spondylosis without myelopathy or radiculopathy, lumbar region: Secondary | ICD-10-CM | POA: Diagnosis not present

## 2022-03-25 DIAGNOSIS — M5136 Other intervertebral disc degeneration, lumbar region: Secondary | ICD-10-CM | POA: Diagnosis not present

## 2022-03-25 DIAGNOSIS — G8929 Other chronic pain: Secondary | ICD-10-CM | POA: Diagnosis not present

## 2022-03-25 DIAGNOSIS — M48061 Spinal stenosis, lumbar region without neurogenic claudication: Secondary | ICD-10-CM | POA: Diagnosis not present

## 2022-03-26 ENCOUNTER — Telehealth: Payer: Self-pay

## 2022-03-26 DIAGNOSIS — R0602 Shortness of breath: Secondary | ICD-10-CM | POA: Diagnosis not present

## 2022-03-26 DIAGNOSIS — I351 Nonrheumatic aortic (valve) insufficiency: Secondary | ICD-10-CM | POA: Diagnosis not present

## 2022-03-26 DIAGNOSIS — I083 Combined rheumatic disorders of mitral, aortic and tricuspid valves: Secondary | ICD-10-CM | POA: Diagnosis not present

## 2022-03-26 NOTE — Telephone Encounter (Signed)
Sherry Burke w/ Home Health calling requesting to move evaluation of patient to next Monday as patient has several appointments.  ? ?Verbal given for this change.  ?[name] 03/26/22 10:11 AM ? ?

## 2022-03-27 ENCOUNTER — Other Ambulatory Visit: Payer: Self-pay

## 2022-03-27 DIAGNOSIS — M545 Low back pain, unspecified: Secondary | ICD-10-CM

## 2022-03-27 DIAGNOSIS — R600 Localized edema: Secondary | ICD-10-CM | POA: Diagnosis not present

## 2022-03-27 DIAGNOSIS — M0579 Rheumatoid arthritis with rheumatoid factor of multiple sites without organ or systems involvement: Secondary | ICD-10-CM | POA: Diagnosis not present

## 2022-03-27 DIAGNOSIS — J449 Chronic obstructive pulmonary disease, unspecified: Secondary | ICD-10-CM | POA: Diagnosis not present

## 2022-03-27 DIAGNOSIS — R29898 Other symptoms and signs involving the musculoskeletal system: Secondary | ICD-10-CM | POA: Diagnosis not present

## 2022-03-27 DIAGNOSIS — M1990 Unspecified osteoarthritis, unspecified site: Secondary | ICD-10-CM | POA: Diagnosis not present

## 2022-03-27 DIAGNOSIS — R0602 Shortness of breath: Secondary | ICD-10-CM

## 2022-03-27 DIAGNOSIS — N1832 Chronic kidney disease, stage 3b: Secondary | ICD-10-CM | POA: Diagnosis not present

## 2022-03-28 DIAGNOSIS — R29898 Other symptoms and signs involving the musculoskeletal system: Secondary | ICD-10-CM | POA: Diagnosis not present

## 2022-03-28 DIAGNOSIS — J449 Chronic obstructive pulmonary disease, unspecified: Secondary | ICD-10-CM | POA: Diagnosis not present

## 2022-03-28 DIAGNOSIS — M1990 Unspecified osteoarthritis, unspecified site: Secondary | ICD-10-CM | POA: Diagnosis not present

## 2022-03-28 DIAGNOSIS — R600 Localized edema: Secondary | ICD-10-CM | POA: Diagnosis not present

## 2022-03-28 DIAGNOSIS — M0579 Rheumatoid arthritis with rheumatoid factor of multiple sites without organ or systems involvement: Secondary | ICD-10-CM | POA: Diagnosis not present

## 2022-03-28 DIAGNOSIS — N1832 Chronic kidney disease, stage 3b: Secondary | ICD-10-CM | POA: Diagnosis not present

## 2022-03-29 ENCOUNTER — Ambulatory Visit (INDEPENDENT_AMBULATORY_CARE_PROVIDER_SITE_OTHER): Payer: Medicare Other | Admitting: Nurse Practitioner

## 2022-03-29 ENCOUNTER — Encounter: Payer: Self-pay | Admitting: Nurse Practitioner

## 2022-03-29 DIAGNOSIS — N3001 Acute cystitis with hematuria: Secondary | ICD-10-CM | POA: Diagnosis not present

## 2022-03-29 LAB — POCT URINALYSIS DIP (CLINITEK)
Bilirubin, UA: NEGATIVE
Glucose, UA: NEGATIVE mg/dL
Ketones, POC UA: NEGATIVE mg/dL
Nitrite, UA: POSITIVE — AB
POC PROTEIN,UA: 30 — AB
Spec Grav, UA: 1.015 (ref 1.010–1.025)
Urobilinogen, UA: 0.2 E.U./dL
pH, UA: 7 (ref 5.0–8.0)

## 2022-03-29 MED ORDER — CIPROFLOXACIN HCL 500 MG PO TABS: 500.0000 mg | ORAL_TABLET | Freq: Two times a day (BID) | ORAL | 0 refills | Status: DC

## 2022-03-29 MED ORDER — CIPROFLOXACIN HCL 500 MG PO TABS
500.0000 mg | ORAL_TABLET | Freq: Two times a day (BID) | ORAL | 0 refills | Status: AC
Start: 2022-03-29 — End: 2022-04-08

## 2022-03-29 NOTE — Progress Notes (Signed)
Virtual Visit via Telephone Note   This visit type was conducted due to national recommendations for restrictions regarding the COVID-19 Pandemic (e.g. social distancing) in an effort to limit this patient's exposure and mitigate transmission in our community.  Due to her co-morbid illnesses, this patient is at least at moderate risk for complications without adequate follow up.  This format is felt to be most appropriate for this patient at this time.  The patient did not have access to video technology/had technical difficulties with video requiring transitioning to audio format only (telephone).  All issues noted in this document were discussed and addressed.  No physical exam could be performed with this format.  Patient verbally consented to a telehealth visit.   Date:  03/29/2022   ID:  Sherry Burke, DOB 25-Nov-1940, MRN [id number]  Patient Location: Home Provider Location: Office/Clinic  PCP:  [name], MD   Evaluation Performed:  Established patient, acute telemedicine visit  Chief Complaint:  urinary symptoms  History of Present Illness:    Sherry Burke is a 81 y.o. female with Urinary symptoms  She reports  urinary frequency and urinary urgency.The current episode started a few days ago and is gradually worsening. Patient states symptoms are 4/10 in intensity, occurring intermittently. She  has not been recently treated for similar symptoms. Pt has advanced RA with impaired mobility. Son brought urine specimen to office for analysis. Pt and son notified an accurate diagnosis cannot be made without an in-office physical exam.  Recommended pt seek emergency medical care immediately if symptoms worsen or become severe.Pt and family verbalized understanding.    The patient does not have symptoms concerning for COVID-19 infection (fever, chills, cough, or new shortness of breath).    Past Medical History:  Diagnosis Date   Acute renal insufficiency    Asthma    Chronic  bronchitis    COPD (chronic obstructive pulmonary disease) (HCC)    Depression    History of migraine headaches    Hyperlipemia    OSA (obstructive sleep apnea)    pt states she doesn't have it   Osteoarthritis    Pneumonia    Rheumatoid arthritis(714.0)    RLS (restless legs syndrome)    Vitamin D deficiency disease     Past Surgical History:  Procedure Laterality Date   KNEE SURGERY Bilateral    when younger   TOTAL ABDOMINAL HYSTERECTOMY  06/1988    Family History  Problem Relation Age of Onset   Lung cancer Father    Stroke Mother    Hypertension Mother    Diabetes Mother    Rheum arthritis Sister    Rheum arthritis Sister     Social History   Socioeconomic History   Marital status: Married    Spouse name: Durene Fruits   Number of children: 3   Years of education: Not on file   Highest education level: Not on file  Occupational History   Occupation: retired    Comment: homemaker  Tobacco Use   Smoking status: Never    Passive exposure: Yes   Smokeless tobacco: Never  Vaping Use   Vaping Use: Never used  Substance and Sexual Activity   Alcohol use: No   Drug use: No   Sexual activity: Not Currently    Birth control/protection: None    Comment: Married  Other Topics Concern   Not on file  Social History Narrative   Lives with her husband, who is very helpful and supportive.   Social Determinants  of Health   Financial Resource Strain: Low Risk    Difficulty of Paying Living Expenses: Not very hard  Food Insecurity: Not on file  Transportation Needs: Not on file  Physical Activity: Inactive   Days of Exercise per Week: 0 days   Minutes of Exercise per Session: 0 min  Stress: Not on file  Social Connections: Not on file  Intimate Partner Violence: Not on file    Outpatient Medications Prior to Visit  Medication Sig Dispense Refill   albuterol (VENTOLIN HFA) 108 (90 Base) MCG/ACT inhaler Inhale 2 puffs into the lungs every 6 (six) hours as needed for  wheezing or shortness of breath. 18 g 11   atorvastatin (LIPITOR) 40 MG tablet Take 1 tablet by mouth once daily 90 tablet 0   Budeson-Glycopyrrol-Formoterol (BREZTRI AEROSPHERE) 160-9-4.8 MCG/ACT AERO Inhale 2 puffs into the lungs in the morning and at bedtime. 32.1 g 1   cloNIDine (CATAPRES) 0.1 MG tablet Take 1 tablet by mouth twice daily 180 tablet 0   cyanocobalamin 2000 MCG tablet Take 2,000 mcg by mouth daily.     famotidine (PEPCID) 20 MG tablet Take 1 tablet (20 mg total) by mouth at bedtime. 30 tablet 5   ferrous sulfate 325 (65 FE) MG tablet Take 325 mg by mouth daily with breakfast.     Fluticasone-Umeclidin-Vilant (TRELEGY ELLIPTA) 200-62.5-25 MCG/ACT AEPB Inhale 1 puff into the lungs daily. 60 each 2   Fluticasone-Umeclidin-Vilant (TRELEGY ELLIPTA) 200-62.5-25 MCG/ACT AEPB Inhale 1 puff into the lungs daily. 14 each 0   furosemide (LASIX) 20 MG tablet Take 1 tablet (20 mg total) by mouth daily. 30 tablet 0   gabapentin (NEURONTIN) 300 MG capsule Take 1 capsule by mouth twice daily 180 capsule 0   HYDROcodone-acetaminophen (NORCO/VICODIN) 5-325 MG tablet Take 1 tablet by mouth every 6 (six) hours as needed for moderate pain. 120 tablet 0   hydroxychloroquine (PLAQUENIL) 200 MG tablet Take 1 tablet (200 mg total) by mouth 2 (two) times daily. 60 tablet 2   lisinopril (ZESTRIL) 10 MG tablet Take 1 tablet (10 mg total) by mouth daily. 90 tablet 0   montelukast (SINGULAIR) 10 MG tablet Take 1 tablet (10 mg total) by mouth at bedtime. 90 tablet 1   omeprazole (PRILOSEC) 40 MG capsule Take 1 capsule (40 mg total) by mouth 2 (two) times daily. 180 capsule 1   ONETOUCH ULTRA test strip USE TO CHECK BLOOD SUGAR ONCE DAILY IN THE MORNING     PARoxetine (PAXIL-CR) 25 MG 24 hr tablet Take 1 tablet by mouth once daily 90 tablet 1   predniSONE (DELTASONE) 20 MG tablet Take 1 tablet (20 mg total) by mouth daily with breakfast. 30 tablet 0   rOPINIRole (REQUIP) 1 MG tablet TAKE 1 TABLET EVERY  MORNINGAND 2 TABLETS AT BEDTIME 270 tablet 0   Spacer/Aero-Holding Chambers DEVI Use with inhaler 1 each 0   Tofacitinib Citrate (XELJANZ) 5 MG TABS Take 5 mg by mouth 2 (two) times daily. Take twice a day.     Vitamin D, Ergocalciferol, (DRISDOL) 1.25 MG (50000 UNIT) CAPS capsule Take 1 capsule by mouth once a week 12 capsule 0   No facility-administered medications prior to visit.    Allergies:   Sulfa antibiotics, Actemra [tocilizumab], Amlodipine, Methotrexate derivatives, Niaspan [niacin], Remicade [infliximab], and Welchol [colesevelam]   Social History   Tobacco Use   Smoking status: Never    Passive exposure: Yes   Smokeless tobacco: Never  Vaping Use   Vaping Use:  Never used  Substance Use Topics   Alcohol use: No   Drug use: No     Review of Systems  Constitutional:  Positive for chills. Negative for fever and malaise/fatigue.  Genitourinary:  Positive for frequency, hematuria and urgency. Negative for dysuria and flank pain.  All other systems reviewed and are negative.   Labs/Other Tests and Data Reviewed:    Recent Labs: 03/30/2021: TSH 2.080 03/06/2022: Hemoglobin 10.6; NT-Pro BNP 927; Platelets 241 03/08/2022: ALT 21; BUN 41; Creatinine, Ser 1.66; Potassium 4.8; Sodium 144   Recent Lipid Panel Lab Results  Component Value Date/Time   CHOL 184 10/30/2021 11:05 AM   TRIG 101 10/30/2021 11:05 AM   HDL 77 10/30/2021 11:05 AM   CHOLHDL 2.4 10/30/2021 11:05 AM   LDLCALC 89 10/30/2021 11:05 AM    Wt Readings from Last 3 Encounters:  03/14/22 200 lb (90.7 kg)  03/06/22 208 lb (94.3 kg)  01/28/22 208 lb (94.3 kg)     Objective:    Vital Signs:  There were no vitals taken for this visit.   Physical Exam No physical exam due to telemedicine visit  ASSESSMENT & PLAN:   1. Acute cystitis with hematuria - POCT URINALYSIS DIP (CLINITEK) - Urine Culture - ciprofloxacin (CIPRO) 500 MG tablet; Take 1 tablet (500 mg total) by mouth 2 (two) times daily for 10  days.  Dispense: 20 tablet; Refill: 0   Push fluids, especially water Seek emergency medical care for any worsening or severe symptoms Follow-up as needed    Orders Placed This Encounter  Procedures   Urine Culture   POCT URINALYSIS DIP (CLINITEK)       COVID-19 Education: The signs and symptoms of COVID-19 were discussed with the patient and how to seek care for testing (follow up with PCP or arrange E-visit). The importance of social distancing was discussed today.   I spent 12 minutes dedicated to the care of this patient on the date of this encounter to include face-to-face time with the patient, as well as: EMR review and prescription medication management.  Follow Up:  In Person prn  Signed,  Jerrell Belfast, DNP  03/29/2022 11:38 AM    [place]

## 2022-04-01 DIAGNOSIS — M1990 Unspecified osteoarthritis, unspecified site: Secondary | ICD-10-CM | POA: Diagnosis not present

## 2022-04-01 DIAGNOSIS — M0579 Rheumatoid arthritis with rheumatoid factor of multiple sites without organ or systems involvement: Secondary | ICD-10-CM | POA: Diagnosis not present

## 2022-04-01 DIAGNOSIS — R29898 Other symptoms and signs involving the musculoskeletal system: Secondary | ICD-10-CM | POA: Diagnosis not present

## 2022-04-01 DIAGNOSIS — N1832 Chronic kidney disease, stage 3b: Secondary | ICD-10-CM | POA: Diagnosis not present

## 2022-04-01 DIAGNOSIS — J449 Chronic obstructive pulmonary disease, unspecified: Secondary | ICD-10-CM | POA: Diagnosis not present

## 2022-04-01 DIAGNOSIS — R600 Localized edema: Secondary | ICD-10-CM | POA: Diagnosis not present

## 2022-04-02 ENCOUNTER — Telehealth: Payer: Self-pay

## 2022-04-02 ENCOUNTER — Other Ambulatory Visit: Payer: Self-pay | Admitting: Nurse Practitioner

## 2022-04-02 ENCOUNTER — Other Ambulatory Visit: Payer: Self-pay | Admitting: Family Medicine

## 2022-04-02 DIAGNOSIS — K1379 Other lesions of oral mucosa: Secondary | ICD-10-CM

## 2022-04-02 LAB — URINE CULTURE

## 2022-04-02 MED ORDER — LIDOCAINE VISCOUS HCL 2 % MT SOLN: 5.0000 mL | Freq: Four times a day (QID) | OROMUCOSAL | 1 refills | Status: AC | PRN

## 2022-04-03 ENCOUNTER — Telehealth: Payer: Self-pay

## 2022-04-03 NOTE — Telephone Encounter (Signed)
Sherry Burke was notified of her ECHO results.  Study reveals mild diastolic dysfunction, mild to moderate AR.

## 2022-04-05 ENCOUNTER — Telehealth: Payer: Self-pay

## 2022-04-05 DIAGNOSIS — J449 Chronic obstructive pulmonary disease, unspecified: Secondary | ICD-10-CM | POA: Diagnosis not present

## 2022-04-05 DIAGNOSIS — M0579 Rheumatoid arthritis with rheumatoid factor of multiple sites without organ or systems involvement: Secondary | ICD-10-CM | POA: Diagnosis not present

## 2022-04-05 DIAGNOSIS — N1832 Chronic kidney disease, stage 3b: Secondary | ICD-10-CM | POA: Diagnosis not present

## 2022-04-05 DIAGNOSIS — M1990 Unspecified osteoarthritis, unspecified site: Secondary | ICD-10-CM | POA: Diagnosis not present

## 2022-04-05 DIAGNOSIS — R600 Localized edema: Secondary | ICD-10-CM | POA: Diagnosis not present

## 2022-04-05 DIAGNOSIS — R29898 Other symptoms and signs involving the musculoskeletal system: Secondary | ICD-10-CM | POA: Diagnosis not present

## 2022-04-05 NOTE — Progress Notes (Signed)
Chronic Care Management Pharmacy Assistant   Name: Sherry Burke  MRN: [id number] DOB: [birth year]   Reason for Encounter: Disease State call for HTN    Recent office visits:  03/29/22 Sherry Belfast NP. Seen for Acute Cystitis. Started on Ciprofloxacin HCI '500mg'$ .   03/06/22 Sherry Brome MD. Seen for SOB. Started on Furosemide '20mg'$  daily and Prednisone '20mg'$ .   Recent consult visits:  03/14/22 (Pulmonology) Sherry Garfinkel MD. Seen for Interstitial Lung Disease. Started on Trelegy Ellipta 200-62.5-25mcg daily.   Hospital visits:  None  Medications: Outpatient Encounter Medications as of 04/05/2022  Medication Sig   albuterol (VENTOLIN HFA) 108 (90 Base) MCG/ACT inhaler Inhale 2 puffs into the lungs every 6 (six) hours as needed for wheezing or shortness of breath.   atorvastatin (LIPITOR) 40 MG tablet Take 1 tablet by mouth once daily   Budeson-Glycopyrrol-Formoterol (BREZTRI AEROSPHERE) 160-9-4.8 MCG/ACT AERO Inhale 2 puffs into the lungs in the morning and at bedtime.   ciprofloxacin (CIPRO) 500 MG tablet Take 1 tablet (500 mg total) by mouth 2 (two) times daily for 10 days.   cloNIDine (CATAPRES) 0.1 MG tablet Take 1 tablet by mouth twice daily   cyanocobalamin 2000 MCG tablet Take 2,000 mcg by mouth daily.   famotidine (PEPCID) 20 MG tablet Take 1 tablet (20 mg total) by mouth at bedtime.   ferrous sulfate 325 (65 FE) MG tablet Take 325 mg by mouth daily with breakfast.   Fluticasone-Umeclidin-Vilant (TRELEGY ELLIPTA) 200-62.5-25 MCG/ACT AEPB Inhale 1 puff into the lungs daily.   Fluticasone-Umeclidin-Vilant (TRELEGY ELLIPTA) 200-62.5-25 MCG/ACT AEPB Inhale 1 puff into the lungs daily.   furosemide (LASIX) 20 MG tablet Take 1 tablet (20 mg total) by mouth daily.   gabapentin (NEURONTIN) 300 MG capsule Take 1 capsule by mouth twice daily   HYDROcodone-acetaminophen (NORCO/VICODIN) 5-325 MG tablet Take 1 tablet by mouth every 6 (six) hours as needed for moderate pain.    hydroxychloroquine (PLAQUENIL) 200 MG tablet Take 1 tablet (200 mg total) by mouth 2 (two) times daily.   lisinopril (ZESTRIL) 10 MG tablet Take 1 tablet (10 mg total) by mouth daily.   magic mouthwash (lidocaine, diphenhydrAMINE, alum & mag hydroxide) suspension Swish and swallow 5 mLs 4 (four) times daily as needed for mouth pain.   montelukast (SINGULAIR) 10 MG tablet Take 1 tablet (10 mg total) by mouth at bedtime.   omeprazole (PRILOSEC) 40 MG capsule Take 1 capsule (40 mg total) by mouth 2 (two) times daily.   ONETOUCH ULTRA test strip USE TO CHECK BLOOD SUGAR ONCE DAILY IN THE MORNING   PARoxetine (PAXIL-CR) 25 MG 24 hr tablet Take 1 tablet by mouth once daily   predniSONE (DELTASONE) 20 MG tablet Take 1 tablet (20 mg total) by mouth daily with breakfast.   rOPINIRole (REQUIP) 1 MG tablet TAKE 1 TABLET EVERY MORNINGAND 2 TABLETS AT BEDTIME   Spacer/Aero-Holding Chambers DEVI Use with inhaler   Tofacitinib Citrate (XELJANZ) 5 MG TABS Take 5 mg by mouth 2 (two) times daily. Take twice a day.   Vitamin D, Ergocalciferol, (DRISDOL) 1.25 MG (50000 UNIT) CAPS capsule Take 1 capsule by mouth once a week   No facility-administered encounter medications on file as of 04/05/2022.     Recent Office Vitals: BP Readings from Last 3 Encounters:  03/14/22 140/66  03/06/22 118/64  01/28/22 120/64   Pulse Readings from Last 3 Encounters:  03/14/22 86  03/06/22 94  01/28/22 88    Wt Readings from Last 3 Encounters:  03/14/22 200 lb (90.7 kg)  03/06/22 208 lb (94.3 kg)  01/28/22 208 lb (94.3 kg)     Kidney Function Lab Results  Component Value Date/Time   CREATININE 1.66 (H) 03/08/2022 09:31 AM   CREATININE 1.82 (H) 03/06/2022 11:27 AM   GFR 37.52 (L) 01/08/2022 09:46 AM   GFRNONAA 40 (L) 01/01/2021 10:16 AM   GFRAA 46 (L) 01/01/2021 10:16 AM       Latest Ref Rng & Units 03/08/2022    9:31 AM 03/06/2022   11:27 AM 01/28/2022   10:26 AM  BMP  Glucose 70 - 99 mg/dL 120   109   91     BUN 8 - 27 mg/dL 41   39   23    Creatinine 0.57 - 1.00 mg/dL 1.66   1.82   1.28    BUN/Creat Ratio 12 - '28 25   21   18    '$ Sodium 134 - 144 mmol/L 144   143   144    Potassium 3.5 - 5.2 mmol/L 4.8   4.6   4.7    Chloride 96 - 106 mmol/L 105   102   107    CO2 20 - 29 mmol/L '23   24   22    '$ Calcium 8.7 - 10.3 mg/dL 9.4   9.1   9.1       Current antihypertensive regimen:  Lisinopril '10mg'$  daily - Pt stated this was D/C and is not taking  Clonidine 0.'1mg'$  twice daily  Furosemide '20mg'$   daily  Patient verbally confirms she is taking the above medications as directed. Yes  How often are you checking your Blood Pressure? infrequently  Current home BP readings: Pt has not been taking her BP lately. Pt stated she has been in a lot of pain and has not checked it. She stated she has been out of her Clonidine because she has had to wait for her son to pick it up. I informed her of when it was sent and she stated her son will get her medication tomorrow. She stated she has not taking her medication for 2 weeks and has no clue how that happened. She called Dr. Tobie Burke and they stated she has had a refill at [facility] and thinks she forgot to call it in.    Sherry Burke readings: 03/14/22 140/66, 03/06/22 118/64, 01/28/22 120/64  Any readings above 180/120? No, not that she can recall. Its never been higher than 155/80  What recent interventions/DTPs have been made by any provider to improve Blood Pressure control since last CPP Visit: No changes  Any recent hospitalizations or ED visits since last visit with CPP? No  What diet changes have been made to improve Blood Pressure Control?  No changes   What exercise is being done to improve your Blood Pressure Control?  Pt arthritis has bee acting up and bothering her to prevent her from exercise. Pt is complaining of being tired all the time and cannot do her housework.    Note:Pt needs Transportation Assistance to get back and forth to doctors appts. Her son  cannot keep driving her everywhere and she really wants to look into this to help her. She canceled a Neurologist appt to have her back looked at due to feeling like a burden. Please advise.   Adherence Review: Is the patient currently on ACE/ARB medication? Yes Does the patient have >5 day gap between last estimated fill dates? CPP to review  Care Gaps: Last  annual wellness visit? None noted   Star Rating Drugs: Not taking anymore Medication:  Last Fill: Day Supply Lisinopril                      12/21/21            90ds                                     10/07/21          [place], [place] Pharmacist Assistant  [phone]

## 2022-04-09 ENCOUNTER — Other Ambulatory Visit: Payer: Self-pay

## 2022-04-09 DIAGNOSIS — M1990 Unspecified osteoarthritis, unspecified site: Secondary | ICD-10-CM | POA: Diagnosis not present

## 2022-04-09 DIAGNOSIS — R29898 Other symptoms and signs involving the musculoskeletal system: Secondary | ICD-10-CM | POA: Diagnosis not present

## 2022-04-09 DIAGNOSIS — N1832 Chronic kidney disease, stage 3b: Secondary | ICD-10-CM | POA: Diagnosis not present

## 2022-04-09 DIAGNOSIS — R600 Localized edema: Secondary | ICD-10-CM | POA: Diagnosis not present

## 2022-04-09 DIAGNOSIS — J449 Chronic obstructive pulmonary disease, unspecified: Secondary | ICD-10-CM | POA: Diagnosis not present

## 2022-04-09 DIAGNOSIS — M0579 Rheumatoid arthritis with rheumatoid factor of multiple sites without organ or systems involvement: Secondary | ICD-10-CM | POA: Diagnosis not present

## 2022-04-09 MED ORDER — HYDROCODONE-ACETAMINOPHEN 5-325 MG PO TABS: 1.0000 | ORAL_TABLET | Freq: Four times a day (QID) | ORAL | 0 refills | Status: DC | PRN

## 2022-04-10 DIAGNOSIS — J449 Chronic obstructive pulmonary disease, unspecified: Secondary | ICD-10-CM

## 2022-04-10 DIAGNOSIS — R600 Localized edema: Secondary | ICD-10-CM

## 2022-04-10 DIAGNOSIS — G43909 Migraine, unspecified, not intractable, without status migrainosus: Secondary | ICD-10-CM

## 2022-04-10 DIAGNOSIS — G2581 Restless legs syndrome: Secondary | ICD-10-CM

## 2022-04-10 DIAGNOSIS — G4733 Obstructive sleep apnea (adult) (pediatric): Secondary | ICD-10-CM

## 2022-04-10 DIAGNOSIS — R29898 Other symptoms and signs involving the musculoskeletal system: Secondary | ICD-10-CM

## 2022-04-10 DIAGNOSIS — E559 Vitamin D deficiency, unspecified: Secondary | ICD-10-CM

## 2022-04-10 DIAGNOSIS — F32A Depression, unspecified: Secondary | ICD-10-CM

## 2022-04-10 DIAGNOSIS — E785 Hyperlipidemia, unspecified: Secondary | ICD-10-CM

## 2022-04-10 DIAGNOSIS — M0579 Rheumatoid arthritis with rheumatoid factor of multiple sites without organ or systems involvement: Secondary | ICD-10-CM

## 2022-04-10 DIAGNOSIS — N1832 Chronic kidney disease, stage 3b: Secondary | ICD-10-CM

## 2022-04-10 DIAGNOSIS — M1990 Unspecified osteoarthritis, unspecified site: Secondary | ICD-10-CM

## 2022-04-11 DIAGNOSIS — J449 Chronic obstructive pulmonary disease, unspecified: Secondary | ICD-10-CM | POA: Diagnosis not present

## 2022-04-11 DIAGNOSIS — R29898 Other symptoms and signs involving the musculoskeletal system: Secondary | ICD-10-CM | POA: Diagnosis not present

## 2022-04-11 DIAGNOSIS — N1832 Chronic kidney disease, stage 3b: Secondary | ICD-10-CM | POA: Diagnosis not present

## 2022-04-11 DIAGNOSIS — M1990 Unspecified osteoarthritis, unspecified site: Secondary | ICD-10-CM | POA: Diagnosis not present

## 2022-04-11 DIAGNOSIS — M0579 Rheumatoid arthritis with rheumatoid factor of multiple sites without organ or systems involvement: Secondary | ICD-10-CM | POA: Diagnosis not present

## 2022-04-11 DIAGNOSIS — R600 Localized edema: Secondary | ICD-10-CM | POA: Diagnosis not present

## 2022-04-16 ENCOUNTER — Other Ambulatory Visit: Payer: Self-pay | Admitting: Family Medicine

## 2022-04-16 DIAGNOSIS — R600 Localized edema: Secondary | ICD-10-CM | POA: Diagnosis not present

## 2022-04-16 DIAGNOSIS — R29898 Other symptoms and signs involving the musculoskeletal system: Secondary | ICD-10-CM | POA: Diagnosis not present

## 2022-04-16 DIAGNOSIS — M1990 Unspecified osteoarthritis, unspecified site: Secondary | ICD-10-CM | POA: Diagnosis not present

## 2022-04-16 DIAGNOSIS — N1832 Chronic kidney disease, stage 3b: Secondary | ICD-10-CM | POA: Diagnosis not present

## 2022-04-16 DIAGNOSIS — M0579 Rheumatoid arthritis with rheumatoid factor of multiple sites without organ or systems involvement: Secondary | ICD-10-CM | POA: Diagnosis not present

## 2022-04-16 DIAGNOSIS — J449 Chronic obstructive pulmonary disease, unspecified: Secondary | ICD-10-CM | POA: Diagnosis not present

## 2022-04-19 DIAGNOSIS — M79604 Pain in right leg: Secondary | ICD-10-CM | POA: Diagnosis not present

## 2022-04-19 DIAGNOSIS — M1991 Primary osteoarthritis, unspecified site: Secondary | ICD-10-CM | POA: Diagnosis not present

## 2022-04-19 DIAGNOSIS — R7982 Elevated C-reactive protein (CRP): Secondary | ICD-10-CM | POA: Diagnosis not present

## 2022-04-19 DIAGNOSIS — E669 Obesity, unspecified: Secondary | ICD-10-CM | POA: Diagnosis not present

## 2022-04-19 DIAGNOSIS — M48061 Spinal stenosis, lumbar region without neurogenic claudication: Secondary | ICD-10-CM | POA: Diagnosis not present

## 2022-04-19 DIAGNOSIS — R600 Localized edema: Secondary | ICD-10-CM | POA: Diagnosis not present

## 2022-04-19 DIAGNOSIS — J849 Interstitial pulmonary disease, unspecified: Secondary | ICD-10-CM | POA: Diagnosis not present

## 2022-04-19 DIAGNOSIS — Z6836 Body mass index (BMI) 36.0-36.9, adult: Secondary | ICD-10-CM | POA: Diagnosis not present

## 2022-04-19 DIAGNOSIS — M0579 Rheumatoid arthritis with rheumatoid factor of multiple sites without organ or systems involvement: Secondary | ICD-10-CM | POA: Diagnosis not present

## 2022-04-19 DIAGNOSIS — Z79899 Other long term (current) drug therapy: Secondary | ICD-10-CM | POA: Diagnosis not present

## 2022-04-19 DIAGNOSIS — M79605 Pain in left leg: Secondary | ICD-10-CM | POA: Diagnosis not present

## 2022-04-19 NOTE — Progress Notes (Signed)
I called pt back after receiving some transportation assistance information and gave her the numbers to [employer] transportation services at  [phone] [facility]). All she has to do is call them to schedule a pick up and drop off. Pt understood and appreciated the help and she will call them.  [email or web address], [place] Pharmacist Assistant  [phone]

## 2022-04-21 DIAGNOSIS — N1832 Chronic kidney disease, stage 3b: Secondary | ICD-10-CM | POA: Diagnosis not present

## 2022-04-21 DIAGNOSIS — R29898 Other symptoms and signs involving the musculoskeletal system: Secondary | ICD-10-CM | POA: Diagnosis not present

## 2022-04-21 DIAGNOSIS — M0579 Rheumatoid arthritis with rheumatoid factor of multiple sites without organ or systems involvement: Secondary | ICD-10-CM | POA: Diagnosis not present

## 2022-04-21 DIAGNOSIS — E785 Hyperlipidemia, unspecified: Secondary | ICD-10-CM | POA: Diagnosis not present

## 2022-04-21 DIAGNOSIS — E559 Vitamin D deficiency, unspecified: Secondary | ICD-10-CM | POA: Diagnosis not present

## 2022-04-21 DIAGNOSIS — F32A Depression, unspecified: Secondary | ICD-10-CM | POA: Diagnosis not present

## 2022-04-21 DIAGNOSIS — M1990 Unspecified osteoarthritis, unspecified site: Secondary | ICD-10-CM | POA: Diagnosis not present

## 2022-04-21 DIAGNOSIS — G4733 Obstructive sleep apnea (adult) (pediatric): Secondary | ICD-10-CM | POA: Diagnosis not present

## 2022-04-21 DIAGNOSIS — R600 Localized edema: Secondary | ICD-10-CM | POA: Diagnosis not present

## 2022-04-21 DIAGNOSIS — G2581 Restless legs syndrome: Secondary | ICD-10-CM | POA: Diagnosis not present

## 2022-04-21 DIAGNOSIS — Z7952 Long term (current) use of systemic steroids: Secondary | ICD-10-CM | POA: Diagnosis not present

## 2022-04-21 DIAGNOSIS — Z8701 Personal history of pneumonia (recurrent): Secondary | ICD-10-CM | POA: Diagnosis not present

## 2022-04-21 DIAGNOSIS — G43909 Migraine, unspecified, not intractable, without status migrainosus: Secondary | ICD-10-CM | POA: Diagnosis not present

## 2022-04-21 DIAGNOSIS — J449 Chronic obstructive pulmonary disease, unspecified: Secondary | ICD-10-CM | POA: Diagnosis not present

## 2022-04-22 DIAGNOSIS — J449 Chronic obstructive pulmonary disease, unspecified: Secondary | ICD-10-CM | POA: Diagnosis not present

## 2022-04-22 DIAGNOSIS — G2581 Restless legs syndrome: Secondary | ICD-10-CM

## 2022-04-22 DIAGNOSIS — R600 Localized edema: Secondary | ICD-10-CM | POA: Diagnosis not present

## 2022-04-22 DIAGNOSIS — E559 Vitamin D deficiency, unspecified: Secondary | ICD-10-CM

## 2022-04-22 DIAGNOSIS — M0579 Rheumatoid arthritis with rheumatoid factor of multiple sites without organ or systems involvement: Secondary | ICD-10-CM | POA: Diagnosis not present

## 2022-04-22 DIAGNOSIS — M1990 Unspecified osteoarthritis, unspecified site: Secondary | ICD-10-CM | POA: Diagnosis not present

## 2022-04-22 DIAGNOSIS — R29898 Other symptoms and signs involving the musculoskeletal system: Secondary | ICD-10-CM | POA: Diagnosis not present

## 2022-04-22 DIAGNOSIS — N1832 Chronic kidney disease, stage 3b: Secondary | ICD-10-CM | POA: Diagnosis not present

## 2022-04-22 DIAGNOSIS — E785 Hyperlipidemia, unspecified: Secondary | ICD-10-CM

## 2022-04-22 DIAGNOSIS — F32A Depression, unspecified: Secondary | ICD-10-CM

## 2022-04-23 DIAGNOSIS — J449 Chronic obstructive pulmonary disease, unspecified: Secondary | ICD-10-CM | POA: Diagnosis not present

## 2022-04-23 DIAGNOSIS — R29898 Other symptoms and signs involving the musculoskeletal system: Secondary | ICD-10-CM | POA: Diagnosis not present

## 2022-04-23 DIAGNOSIS — N1832 Chronic kidney disease, stage 3b: Secondary | ICD-10-CM | POA: Diagnosis not present

## 2022-04-23 DIAGNOSIS — R600 Localized edema: Secondary | ICD-10-CM | POA: Diagnosis not present

## 2022-04-23 DIAGNOSIS — M1990 Unspecified osteoarthritis, unspecified site: Secondary | ICD-10-CM | POA: Diagnosis not present

## 2022-04-23 DIAGNOSIS — M0579 Rheumatoid arthritis with rheumatoid factor of multiple sites without organ or systems involvement: Secondary | ICD-10-CM | POA: Diagnosis not present

## 2022-04-25 ENCOUNTER — Other Ambulatory Visit: Payer: Self-pay | Admitting: Physician Assistant

## 2022-04-26 DIAGNOSIS — R600 Localized edema: Secondary | ICD-10-CM | POA: Diagnosis not present

## 2022-04-26 DIAGNOSIS — J449 Chronic obstructive pulmonary disease, unspecified: Secondary | ICD-10-CM | POA: Diagnosis not present

## 2022-04-26 DIAGNOSIS — M0579 Rheumatoid arthritis with rheumatoid factor of multiple sites without organ or systems involvement: Secondary | ICD-10-CM | POA: Diagnosis not present

## 2022-04-26 DIAGNOSIS — M1990 Unspecified osteoarthritis, unspecified site: Secondary | ICD-10-CM | POA: Diagnosis not present

## 2022-04-26 DIAGNOSIS — R29898 Other symptoms and signs involving the musculoskeletal system: Secondary | ICD-10-CM | POA: Diagnosis not present

## 2022-04-26 DIAGNOSIS — N1832 Chronic kidney disease, stage 3b: Secondary | ICD-10-CM | POA: Diagnosis not present

## 2022-05-03 DIAGNOSIS — J449 Chronic obstructive pulmonary disease, unspecified: Secondary | ICD-10-CM | POA: Diagnosis not present

## 2022-05-03 DIAGNOSIS — R29898 Other symptoms and signs involving the musculoskeletal system: Secondary | ICD-10-CM | POA: Diagnosis not present

## 2022-05-03 DIAGNOSIS — M0579 Rheumatoid arthritis with rheumatoid factor of multiple sites without organ or systems involvement: Secondary | ICD-10-CM | POA: Diagnosis not present

## 2022-05-03 DIAGNOSIS — M1990 Unspecified osteoarthritis, unspecified site: Secondary | ICD-10-CM | POA: Diagnosis not present

## 2022-05-03 DIAGNOSIS — N1832 Chronic kidney disease, stage 3b: Secondary | ICD-10-CM | POA: Diagnosis not present

## 2022-05-03 DIAGNOSIS — R600 Localized edema: Secondary | ICD-10-CM | POA: Diagnosis not present

## 2022-05-06 DIAGNOSIS — N1832 Chronic kidney disease, stage 3b: Secondary | ICD-10-CM | POA: Diagnosis not present

## 2022-05-06 DIAGNOSIS — M1990 Unspecified osteoarthritis, unspecified site: Secondary | ICD-10-CM | POA: Diagnosis not present

## 2022-05-06 DIAGNOSIS — J449 Chronic obstructive pulmonary disease, unspecified: Secondary | ICD-10-CM | POA: Diagnosis not present

## 2022-05-06 DIAGNOSIS — R600 Localized edema: Secondary | ICD-10-CM | POA: Diagnosis not present

## 2022-05-06 DIAGNOSIS — R29898 Other symptoms and signs involving the musculoskeletal system: Secondary | ICD-10-CM | POA: Diagnosis not present

## 2022-05-06 DIAGNOSIS — M0579 Rheumatoid arthritis with rheumatoid factor of multiple sites without organ or systems involvement: Secondary | ICD-10-CM | POA: Diagnosis not present

## 2022-05-07 ENCOUNTER — Encounter: Payer: Self-pay | Admitting: Family Medicine

## 2022-05-07 ENCOUNTER — Ambulatory Visit (INDEPENDENT_AMBULATORY_CARE_PROVIDER_SITE_OTHER): Payer: Medicare Other | Admitting: Family Medicine

## 2022-05-07 VITALS — BP 124/58 | HR 94 | Temp 98.3°F | Resp 15 | Ht 60.0 in | Wt 200.0 lb

## 2022-05-07 DIAGNOSIS — I152 Hypertension secondary to endocrine disorders: Secondary | ICD-10-CM

## 2022-05-07 DIAGNOSIS — M1991 Primary osteoarthritis, unspecified site: Secondary | ICD-10-CM | POA: Insufficient documentation

## 2022-05-07 DIAGNOSIS — E1159 Type 2 diabetes mellitus with other circulatory complications: Secondary | ICD-10-CM | POA: Diagnosis not present

## 2022-05-07 DIAGNOSIS — E538 Deficiency of other specified B group vitamins: Secondary | ICD-10-CM | POA: Diagnosis not present

## 2022-05-07 DIAGNOSIS — M150 Primary generalized (osteo)arthritis: Secondary | ICD-10-CM | POA: Diagnosis not present

## 2022-05-07 DIAGNOSIS — K219 Gastro-esophageal reflux disease without esophagitis: Secondary | ICD-10-CM

## 2022-05-07 DIAGNOSIS — Z79899 Other long term (current) drug therapy: Secondary | ICD-10-CM | POA: Insufficient documentation

## 2022-05-07 DIAGNOSIS — G4733 Obstructive sleep apnea (adult) (pediatric): Secondary | ICD-10-CM | POA: Diagnosis not present

## 2022-05-07 DIAGNOSIS — N3001 Acute cystitis with hematuria: Secondary | ICD-10-CM | POA: Diagnosis not present

## 2022-05-07 DIAGNOSIS — G2581 Restless legs syndrome: Secondary | ICD-10-CM

## 2022-05-07 DIAGNOSIS — M48062 Spinal stenosis, lumbar region with neurogenic claudication: Secondary | ICD-10-CM | POA: Diagnosis not present

## 2022-05-07 DIAGNOSIS — J8409 Other alveolar and parieto-alveolar conditions: Secondary | ICD-10-CM | POA: Insufficient documentation

## 2022-05-07 DIAGNOSIS — E1169 Type 2 diabetes mellitus with other specified complication: Secondary | ICD-10-CM | POA: Diagnosis not present

## 2022-05-07 DIAGNOSIS — M48061 Spinal stenosis, lumbar region without neurogenic claudication: Secondary | ICD-10-CM | POA: Insufficient documentation

## 2022-05-07 DIAGNOSIS — E785 Hyperlipidemia, unspecified: Secondary | ICD-10-CM | POA: Diagnosis not present

## 2022-05-07 DIAGNOSIS — M109 Gout, unspecified: Secondary | ICD-10-CM | POA: Insufficient documentation

## 2022-05-07 DIAGNOSIS — I509 Heart failure, unspecified: Secondary | ICD-10-CM | POA: Insufficient documentation

## 2022-05-07 DIAGNOSIS — M0579 Rheumatoid arthritis with rheumatoid factor of multiple sites without organ or systems involvement: Secondary | ICD-10-CM

## 2022-05-07 DIAGNOSIS — J4540 Moderate persistent asthma, uncomplicated: Secondary | ICD-10-CM | POA: Diagnosis not present

## 2022-05-07 DIAGNOSIS — D696 Thrombocytopenia, unspecified: Secondary | ICD-10-CM | POA: Insufficient documentation

## 2022-05-07 DIAGNOSIS — R300 Dysuria: Secondary | ICD-10-CM

## 2022-05-07 LAB — POCT URINALYSIS DIP (CLINITEK)
Bilirubin, UA: NEGATIVE
Blood, UA: NEGATIVE
Glucose, UA: NEGATIVE mg/dL
Ketones, POC UA: NEGATIVE mg/dL
Nitrite, UA: NEGATIVE
Spec Grav, UA: 1.015 (ref 1.010–1.025)
Urobilinogen, UA: 0.2 E.U./dL
pH, UA: 7.5 (ref 5.0–8.0)

## 2022-05-07 MED ORDER — NALOXONE HCL 4 MG/0.1ML NA LIQD: 1.0000 | Freq: Once | NASAL | 0 refills | Status: AC

## 2022-05-07 MED ORDER — CIPROFLOXACIN HCL 250 MG PO TABS: 250.0000 mg | ORAL_TABLET | Freq: Two times a day (BID) | ORAL | 0 refills | Status: AC

## 2022-05-07 MED ORDER — HYDROCODONE-ACETAMINOPHEN 5-325 MG PO TABS: 1.0000 | ORAL_TABLET | Freq: Four times a day (QID) | ORAL | 0 refills | Status: DC | PRN

## 2022-05-07 NOTE — Assessment & Plan Note (Addendum)
Check UA Ordered Urine culture. Prescription: Cipro

## 2022-05-07 NOTE — Assessment & Plan Note (Signed)
Management per specialist. The current medical regimen is effective;  continue present plan and medications. Taking Trelegy Ellipta. Stopped Lisinopril.

## 2022-05-07 NOTE — Assessment & Plan Note (Addendum)
Management per specialist.  Continue current medications. 

## 2022-05-07 NOTE — Assessment & Plan Note (Addendum)
Well controlled.  Currently, Clonidine 0.1 mg twice a day. Continue to work on eating a healthy diet and exercise.  Labs drawn today.

## 2022-05-07 NOTE — Assessment & Plan Note (Signed)
Control: Good. Recommend check sugars fasting daily. Recommend check feet daily. Recommend annual eye exams. Continue to work on eating a healthy diet and exercise.  Labs drawn today.

## 2022-05-07 NOTE — Progress Notes (Signed)
Subjective:  Patient ID: Sherry Burke, female    DOB: [birth year]  Age: 81 y.o. MRN: [id number]  Chief Complaint  Patient presents with   Diabetes   Hyperlipidemia   Hypertension   HPI: Diabetes: Type Diabetes with hyperlipidemia: pt checks sugars sporadically. Usually run 120-140s. Checks feet daily.  She does not take any medication for diabetes. Last Eye exam was one year ago.  Restless leg syndrome: She is on Requip 1 mg 2 tablets at bedtime.  Mixed hyperlipidemia: On lipitor 40 mg once daily.   Back pain: on gabapentin '300mg'$  twice daily.  Hydrocodone/acetaminophen 5/325 mg 3 to 4/day.  Patient continues to have pain.  Tries to avoid taking the hydrocodone because it makes her very sleepy.  RA: ON plaquenil, arava, and xeljanz. Pt had rash on leg which cleared up with [name]. Dr Trudie Reed decrease prednisone to 7.5 mg once daily, and then taper to 5 mg daily   GERD: Taking Omeprazole 40 mg twice a day and Famotidine 20 mg daily at bedtime.  Asthma/chronic respiratory failure with hypoxia: Pulmonologist, Dr Vaughan Browner, started on Trelegy and it is helping. Dypsnea has improved.    Current Outpatient Medications on File Prior to Visit  Medication Sig Dispense Refill   albuterol (VENTOLIN HFA) 108 (90 Base) MCG/ACT inhaler Inhale 2 puffs into the lungs every 6 (six) hours as needed for wheezing or shortness of breath. 18 g 11   atorvastatin (LIPITOR) 40 MG tablet Take 1 tablet by mouth once daily 90 tablet 0   cloNIDine (CATAPRES) 0.1 MG tablet Take 1 tablet by mouth twice daily 180 tablet 0   cyanocobalamin 2000 MCG tablet Take 2,000 mcg by mouth daily.     famotidine (PEPCID) 20 MG tablet Take 1 tablet (20 mg total) by mouth at bedtime. 30 tablet 5   ferrous sulfate 325 (65 FE) MG tablet Take 325 mg by mouth daily with breakfast.     Fluticasone-Umeclidin-Vilant (TRELEGY ELLIPTA) 200-62.5-25 MCG/ACT AEPB Inhale 1 puff into the lungs daily. 60 each 2   furosemide (LASIX) 20 MG tablet  Take 1 tablet (20 mg total) by mouth daily. 30 tablet 0   gabapentin (NEURONTIN) 300 MG capsule Take 1 capsule by mouth twice daily 180 capsule 0   hydroxychloroquine (PLAQUENIL) 200 MG tablet Take 1 tablet (200 mg total) by mouth 2 (two) times daily. 60 tablet 2   leflunomide (ARAVA) 20 MG tablet Take 20 mg by mouth daily.     magic mouthwash (lidocaine, diphenhydrAMINE, alum & mag hydroxide) suspension Swish and swallow 5 mLs 4 (four) times daily as needed for mouth pain. 360 mL 1   omeprazole (PRILOSEC) 40 MG capsule Take 1 capsule (40 mg total) by mouth 2 (two) times daily. 180 capsule 1   ONETOUCH ULTRA test strip USE TO CHECK BLOOD SUGAR ONCE DAILY IN THE MORNING     PARoxetine (PAXIL-CR) 25 MG 24 hr tablet Take 1 tablet by mouth once daily 90 tablet 1   rOPINIRole (REQUIP) 1 MG tablet TAKE 1 TABLET EVERY MORNINGAND 2 TABLETS AT BEDTIME 270 tablet 0   Spacer/Aero-Holding Chambers DEVI Use with inhaler 1 each 0   Tofacitinib Citrate (XELJANZ) 5 MG TABS Take 5 mg by mouth 2 (two) times daily. Take twice a day.     Vitamin D, Ergocalciferol, (DRISDOL) 1.25 MG (50000 UNIT) CAPS capsule Take 1 capsule by mouth once a week 12 capsule 0   No current facility-administered medications on file prior to visit.   Past Medical  History:  Diagnosis Date   Acute renal insufficiency    Asthma    Chronic bronchitis    COPD (chronic obstructive pulmonary disease) (HCC)    Depression    History of migraine headaches    Hyperlipemia    OSA (obstructive sleep apnea)    pt states she doesn't have it   Osteoarthritis    Pneumonia    Rheumatoid arthritis(714.0)    RLS (restless legs syndrome)    Vitamin D deficiency disease    Past Surgical History:  Procedure Laterality Date   KNEE SURGERY Bilateral    when younger   TOTAL ABDOMINAL HYSTERECTOMY  06/1988    Family History  Problem Relation Age of Onset   Lung cancer Father    Stroke Mother    Hypertension Mother    Diabetes Mother    Rheum  arthritis Sister    Rheum arthritis Sister    Social History   Socioeconomic History   Marital status: Married    Spouse name: Durene Fruits   Number of children: 3   Years of education: Not on file   Highest education level: Not on file  Occupational History   Occupation: retired    Comment: homemaker  Tobacco Use   Smoking status: Never    Passive exposure: Yes   Smokeless tobacco: Never  Vaping Use   Vaping Use: Never used  Substance and Sexual Activity   Alcohol use: No   Drug use: No   Sexual activity: Not Currently    Birth control/protection: None    Comment: Married  Other Topics Concern   Not on file  Social History Narrative   Lives with her husband, who is very helpful and supportive.   Social Determinants of Health   Financial Resource Strain: Low Risk  (02/07/2022)   Overall Financial Resource Strain (CARDIA)    Difficulty of Paying Living Expenses: Not very hard  Food Insecurity: No Food Insecurity (01/10/2021)   Hunger Vital Sign    Worried About Running Out of Food in the Last Year: Never true    Ran Out of Food in the Last Year: Never true  Transportation Needs: No Transportation Needs (07/27/2019)   PRAPARE - Hydrologist (Medical): No    Lack of Transportation (Non-Medical): No  Physical Activity: Inactive (12/04/2021)   Exercise Vital Sign    Days of Exercise per Week: 0 days    Minutes of Exercise per Session: 0 min  Stress: No Stress Concern Present (07/27/2019)   [place]    Feeling of Stress : Only a little  Social Connections: Socially Integrated (07/27/2019)   Social Connection and Isolation Panel [NHANES]    Frequency of Communication with Friends and Family: More than three times a week    Frequency of Social Gatherings with Friends and Family: Once a week    Attends Religious Services: More than 4 times per year    Active Member of [employer] or  Organizations: Yes    Attends Archivist Meetings: 1 to 4 times per year    Marital Status: Married    Review of Systems  Constitutional:  Negative for appetite change, fatigue and fever.  HENT:  Negative for congestion, ear pain, sinus pressure and sore throat.   Respiratory:  Negative for cough, chest tightness, shortness of breath and wheezing.   Cardiovascular:  Negative for chest pain and palpitations.  Gastrointestinal:  Negative for abdominal pain,  constipation, diarrhea, nausea and vomiting.  Genitourinary:  Negative for dysuria and hematuria.  Musculoskeletal:  Negative for arthralgias, back pain, joint swelling and myalgias.  Skin:  Negative for rash.  Neurological:  Negative for dizziness, weakness and headaches.  Psychiatric/Behavioral:  Negative for dysphoric mood. The patient is not nervous/anxious.      Objective:  BP (!) 124/58   Pulse 94   Temp 98.3 F (36.8 C)   Resp 15   Ht 5' (1.524 m)   Wt 200 lb (90.7 kg) Comment: Approximately. Patient in wheelchair  SpO2 97%   BMI 39.06 kg/m      05/07/2022    9:16 AM 03/14/2022    9:01 AM 03/06/2022   10:47 AM  BP/Weight  Systolic BP 601 093 235  Diastolic BP 58 66 64  Wt. (Lbs) 200 200 208  BMI 39.06 kg/m2 37.79 kg/m2 39.3 kg/m2    Physical Exam Vitals reviewed.  Constitutional:      Appearance: Normal appearance. She is normal weight.  Cardiovascular:     Rate and Rhythm: Normal rate and regular rhythm.     Pulses: Normal pulses.     Heart sounds: Normal heart sounds.  Pulmonary:     Effort: Pulmonary effort is normal.     Breath sounds: Normal breath sounds.  Abdominal:     General: Abdomen is flat. Bowel sounds are normal.     Palpations: Abdomen is soft.  Neurological:     Mental Status: She is alert and oriented to person, place, and time.  Psychiatric:        Mood and Affect: Mood normal.        Behavior: Behavior normal.    Diabetic Foot Exam - Simple   Simple Foot Form   05/08/2022  8:54 PM  Visual Inspection Sensation Testing Pulse Check Comments      Lab Results  Component Value Date   WBC 10.0 05/07/2022   HGB 9.9 (L) 05/07/2022   HCT 30.7 (L) 05/07/2022   PLT 220 05/07/2022   GLUCOSE 108 (H) 05/07/2022   CHOL 192 05/07/2022   TRIG 112 05/07/2022   HDL 84 05/07/2022   LDLCALC 89 05/07/2022   ALT 18 05/07/2022   AST 21 05/07/2022   NA 141 05/07/2022   K 4.0 05/07/2022   CL 103 05/07/2022   CREATININE 1.37 (H) 05/07/2022   BUN 23 05/07/2022   CO2 21 05/07/2022   TSH 2.080 03/30/2021   HGBA1C 6.4 (H) 05/07/2022      Assessment & Plan:   Problem List Items Addressed This Visit       Cardiovascular and Mediastinum   Hypertension associated with diabetes ([place]) - Primary    Well controlled.  Currently, Clonidine 0.1 mg twice a day. Continue to work on eating a healthy diet and exercise.  Labs drawn today.       Relevant Orders   CBC With Diff/Platelet (Completed)   Comprehensive metabolic panel (Completed)     Respiratory   OSA (obstructive sleep apnea)    Stable with CPAP.      Moderate persistent asthma    Management per specialist. The current medical regimen is effective;  continue present plan and medications. Taking Trelegy Ellipta. Stopped Lisinopril.        Digestive   GERD without esophagitis    The current medical regimen is effective;  continue present plan and medications. Taking Omeprazole 40 mg twice a day and Famotidine 20 mg daily at bedtime.  Endocrine   Dyslipidemia associated with type 2 diabetes mellitus ([place])    Control: Good. Recommend check sugars fasting daily. Recommend check feet daily. Recommend annual eye exams. Continue to work on eating a healthy diet and exercise.  Labs drawn today.        Relevant Orders   CBC With Diff/Platelet (Completed)   Lipid panel (Completed)   Hemoglobin A1c (Completed)     Musculoskeletal and Integument   Rheumatoid arthritis involving  multiple sites with positive rheumatoid factor (HCC)    Management per specialist. Continue current medications      Relevant Medications   leflunomide (ARAVA) 20 MG tablet   HYDROcodone-acetaminophen (NORCO/VICODIN) 5-325 MG tablet   buprenorphine (BUTRANS) 15 MCG/HR   Primary generalized (osteo)arthritis    Continue gabapentin 300 mg twice daily. Start on [employer] 15 mcg/hr weekly. Continue hydrocodone/acetaminophen 5/325 mg 1 p.o. 4 times daily as needed for severe breakthrough pain. Prescription for Narcan given and discussed with patient and her son who was present how to use this.      Relevant Medications   leflunomide (ARAVA) 20 MG tablet   HYDROcodone-acetaminophen (NORCO/VICODIN) 5-325 MG tablet   buprenorphine (BUTRANS) 15 MCG/HR     Genitourinary   UTI (urinary tract infection)    Check UA Ordered Urine culture. Prescription: Cipro        Other   RLS (restless legs syndrome)    The current medical regimen is effective;  continue present plan and medications. Currently taking Requip 1 mg 2 tablets at bedtime.      Spinal stenosis of lumbar region   B12 deficiency    Check labs.      Relevant Orders   Vitamin B12 (Completed)    Follow-up: Return in about 1 month (around 06/06/2022) for pain mgmt.  An After Visit Summary was printed and given to the patient.  [name], MD [facility] [phone]

## 2022-05-08 ENCOUNTER — Telehealth: Payer: Self-pay

## 2022-05-08 LAB — COMPREHENSIVE METABOLIC PANEL
ALT: 18 IU/L (ref 0–32)
AST: 21 IU/L (ref 0–40)
Albumin/Globulin Ratio: 1.6 (ref 1.2–2.2)
Albumin: 4.1 g/dL (ref 3.7–4.7)
Alkaline Phosphatase: 61 IU/L (ref 44–121)
BUN/Creatinine Ratio: 17 (ref 12–28)
BUN: 23 mg/dL (ref 8–27)
Bilirubin Total: 0.3 mg/dL (ref 0.0–1.2)
CO2: 21 mmol/L (ref 20–29)
Calcium: 9.1 mg/dL (ref 8.7–10.3)
Chloride: 103 mmol/L (ref 96–106)
Creatinine, Ser: 1.37 mg/dL — ABNORMAL HIGH (ref 0.57–1.00)
Globulin, Total: 2.6 g/dL (ref 1.5–4.5)
Glucose: 108 mg/dL — ABNORMAL HIGH (ref 70–99)
Potassium: 4 mmol/L (ref 3.5–5.2)
Sodium: 141 mmol/L (ref 134–144)
Total Protein: 6.7 g/dL (ref 6.0–8.5)
eGFR: 39 mL/min/{1.73_m2} — ABNORMAL LOW (ref >59)

## 2022-05-08 LAB — LIPID PANEL
Chol/HDL Ratio: 2.3 ratio (ref 0.0–4.4)
Cholesterol, Total: 192 mg/dL (ref 100–199)
HDL: 84 mg/dL (ref >39)
LDL Chol Calc (NIH): 89 mg/dL (ref 0–99)
Triglycerides: 112 mg/dL (ref 0–149)
VLDL Cholesterol Cal: 19 mg/dL (ref 5–40)

## 2022-05-08 LAB — CBC WITH DIFF/PLATELET
Basophils Absolute: 0.1 10*3/uL (ref 0.0–0.2)
Basos: 1 %
EOS (ABSOLUTE): 0.4 10*3/uL (ref 0.0–0.4)
Eos: 4 %
Hematocrit: 30.7 % — ABNORMAL LOW (ref 34.0–46.6)
Hemoglobin: 9.9 g/dL — ABNORMAL LOW (ref 11.1–15.9)
Immature Grans (Abs): 0.2 10*3/uL — ABNORMAL HIGH (ref 0.0–0.1)
Immature Granulocytes: 2 %
Lymphocytes Absolute: 1.2 10*3/uL (ref 0.7–3.1)
Lymphs: 12 %
MCH: 27.3 pg (ref 26.6–33.0)
MCHC: 32.2 g/dL (ref 31.5–35.7)
MCV: 85 fL (ref 79–97)
Monocytes Absolute: 0.8 10*3/uL (ref 0.1–0.9)
Monocytes: 8 %
Neutrophils Absolute: 7.4 10*3/uL — ABNORMAL HIGH (ref 1.4–7.0)
Neutrophils: 73 %
Platelets: 220 10*3/uL (ref 150–450)
RBC: 3.62 x10E6/uL — ABNORMAL LOW (ref 3.77–5.28)
RDW: 16.2 % — ABNORMAL HIGH (ref 11.7–15.4)
WBC: 10 10*3/uL (ref 3.4–10.8)

## 2022-05-08 LAB — VITAMIN B12: Vitamin B-12: 1161 pg/mL (ref 232–1245)

## 2022-05-08 LAB — HEMOGLOBIN A1C
Est. average glucose Bld gHb Est-mCnc: 137 mg/dL
Hgb A1c MFr Bld: 6.4 % — ABNORMAL HIGH (ref 4.8–5.6)

## 2022-05-08 MED ORDER — BUPRENORPHINE 15 MCG/HR TD PTWK: 1.0000 | MEDICATED_PATCH | TRANSDERMAL | 1 refills | Status: DC

## 2022-05-08 NOTE — Telephone Encounter (Signed)
[facility] called regarding butrans patches and hydrocodone. Per Dr. Tobie Poet patient is to use hydrocodone for break through pain and she will decrease with future rx's. Andris Flurry, Pharmacist, informed.

## 2022-05-08 NOTE — Assessment & Plan Note (Addendum)
Continue gabapentin 300 mg twice daily. Start on [employer] 15 mcg/hr weekly. Continue hydrocodone/acetaminophen 5/325 mg 1 p.o. 4 times daily as needed for severe breakthrough pain. Prescription for Narcan given and discussed with patient and her son who was present how to use this.

## 2022-05-08 NOTE — Progress Notes (Signed)
Blood count abnormal.  Anemic.  Recommend add iron studies Liver function normal.  Kidney function abnormal but improved. B12 therapeutic. Cholesterol: Well-controlled. HBA1C: 6.4.  Diabetes well controlled.

## 2022-05-09 ENCOUNTER — Telehealth: Payer: Self-pay

## 2022-05-09 NOTE — Telephone Encounter (Signed)
[name], home health physical therapist calling as he has attempted twice this week for home visit. Due to unable to complete they will ove discharge visit to next week after 7/4.  Sherry Burke 05/09/22 3:36 PM

## 2022-05-10 LAB — URINE CULTURE

## 2022-05-14 DIAGNOSIS — M0579 Rheumatoid arthritis with rheumatoid factor of multiple sites without organ or systems involvement: Secondary | ICD-10-CM | POA: Diagnosis not present

## 2022-05-14 DIAGNOSIS — J449 Chronic obstructive pulmonary disease, unspecified: Secondary | ICD-10-CM | POA: Diagnosis not present

## 2022-05-14 DIAGNOSIS — M1990 Unspecified osteoarthritis, unspecified site: Secondary | ICD-10-CM | POA: Diagnosis not present

## 2022-05-14 DIAGNOSIS — N1832 Chronic kidney disease, stage 3b: Secondary | ICD-10-CM | POA: Diagnosis not present

## 2022-05-14 DIAGNOSIS — R29898 Other symptoms and signs involving the musculoskeletal system: Secondary | ICD-10-CM | POA: Diagnosis not present

## 2022-05-14 DIAGNOSIS — R600 Localized edema: Secondary | ICD-10-CM | POA: Diagnosis not present

## 2022-05-15 DIAGNOSIS — R600 Localized edema: Secondary | ICD-10-CM | POA: Diagnosis not present

## 2022-05-15 DIAGNOSIS — R29898 Other symptoms and signs involving the musculoskeletal system: Secondary | ICD-10-CM | POA: Diagnosis not present

## 2022-05-15 DIAGNOSIS — J449 Chronic obstructive pulmonary disease, unspecified: Secondary | ICD-10-CM | POA: Diagnosis not present

## 2022-05-15 DIAGNOSIS — M1990 Unspecified osteoarthritis, unspecified site: Secondary | ICD-10-CM | POA: Diagnosis not present

## 2022-05-15 DIAGNOSIS — N1832 Chronic kidney disease, stage 3b: Secondary | ICD-10-CM | POA: Diagnosis not present

## 2022-05-15 DIAGNOSIS — M0579 Rheumatoid arthritis with rheumatoid factor of multiple sites without organ or systems involvement: Secondary | ICD-10-CM | POA: Diagnosis not present

## 2022-05-16 ENCOUNTER — Other Ambulatory Visit: Payer: Self-pay | Admitting: *Deleted

## 2022-05-16 DIAGNOSIS — R29898 Other symptoms and signs involving the musculoskeletal system: Secondary | ICD-10-CM | POA: Diagnosis not present

## 2022-05-16 DIAGNOSIS — R600 Localized edema: Secondary | ICD-10-CM | POA: Diagnosis not present

## 2022-05-16 DIAGNOSIS — M0579 Rheumatoid arthritis with rheumatoid factor of multiple sites without organ or systems involvement: Secondary | ICD-10-CM | POA: Diagnosis not present

## 2022-05-16 DIAGNOSIS — N1832 Chronic kidney disease, stage 3b: Secondary | ICD-10-CM | POA: Diagnosis not present

## 2022-05-16 DIAGNOSIS — J449 Chronic obstructive pulmonary disease, unspecified: Secondary | ICD-10-CM | POA: Diagnosis not present

## 2022-05-16 DIAGNOSIS — M1990 Unspecified osteoarthritis, unspecified site: Secondary | ICD-10-CM | POA: Diagnosis not present

## 2022-05-16 MED ORDER — TRELEGY ELLIPTA 200-62.5-25 MCG/ACT IN AEPB: 1.0000 | INHALATION_SPRAY | Freq: Every day | RESPIRATORY_TRACT | 3 refills | Status: AC

## 2022-05-21 ENCOUNTER — Ambulatory Visit (INDEPENDENT_AMBULATORY_CARE_PROVIDER_SITE_OTHER): Payer: Medicare Other

## 2022-05-21 LAB — HM DIABETES EYE EXAM

## 2022-05-23 ENCOUNTER — Ambulatory Visit: Payer: Medicare Other | Admitting: Family Medicine

## 2022-05-23 ENCOUNTER — Encounter: Payer: Self-pay | Admitting: Family Medicine

## 2022-05-23 VITALS — BP 145/65 | Ht 61.0 in | Wt 200.0 lb

## 2022-05-23 DIAGNOSIS — Z0000 Encounter for general adult medical examination without abnormal findings: Secondary | ICD-10-CM

## 2022-05-23 NOTE — Progress Notes (Signed)
I connected with  Sherry Burke on 05/23/22 by a audio enabled telemedicine application and verified that I am speaking with the correct person using two identifiers.  Patient Location: Home  Provider Location: Home Office  I discussed the limitations of evaluation and management by telemedicine. The patient expressed understanding and agreed to proceed.   Subjective:   Sherry Burke is a 81 y.o. female who presents for Medicare Annual (Subsequent) preventive examination.   Cardiac Risk Factors include: advanced age (>66mn, >>29women);sedentary lifestyle;obesity (BMI >30kg/m2);dyslipidemia     Objective:    Today's Vitals   05/23/22 1302 05/23/22 1303  BP: (!) 145/65   Weight: 200 lb (90.7 kg)   Height: '5\' 1"'$  (1.549 m)   PainSc:  0-No pain   Body mass index is 37.79 kg/m.     05/23/2022    1:08 PM 08/25/2019    4:32 PM 07/27/2019    1:06 PM  Advanced Directives  Does Patient Have a Medical Advance Directive? Yes Yes No  [place]   Does patient want to make changes to medical advance directive?  No - Patient declined   Copy of [place] Chart?  No - copy requested   Would patient like information on creating a medical advance directive?   Yes (Inpatient - patient defers creating a medical advance directive and declines information at this time)    Current Medications (verified) Outpatient Encounter Medications as of 05/23/2022  Medication Sig   albuterol (VENTOLIN HFA) 108 (90 Base) MCG/ACT inhaler Inhale 2 puffs into the lungs every 6 (six) hours as needed for wheezing or shortness of breath.   atorvastatin (LIPITOR) 40 MG tablet Take 1 tablet by mouth once daily   buprenorphine (BUTRANS) 15 MCG/HR Place 1 patch onto the skin once a week.   cloNIDine (CATAPRES) 0.1 MG tablet Take 1 tablet by mouth twice daily   cyanocobalamin 2000 MCG tablet Take 2,000 mcg by  mouth daily.   famotidine (PEPCID) 20 MG tablet Take 1 tablet (20 mg total) by mouth at bedtime.   ferrous sulfate 325 (65 FE) MG tablet Take 325 mg by mouth daily with breakfast.   Fluticasone-Umeclidin-Vilant (TRELEGY ELLIPTA) 200-62.5-25 MCG/ACT AEPB Inhale 1 puff into the lungs daily.   furosemide (LASIX) 20 MG tablet Take 1 tablet (20 mg total) by mouth daily.   gabapentin (NEURONTIN) 300 MG capsule Take 1 capsule by mouth twice daily   HYDROcodone-acetaminophen (NORCO/VICODIN) 5-325 MG tablet Take 1 tablet by mouth every 6 (six) hours as needed for moderate pain.   hydroxychloroquine (PLAQUENIL) 200 MG tablet Take 1 tablet (200 mg total) by mouth 2 (two) times daily.   leflunomide (ARAVA) 20 MG tablet Take 20 mg by mouth daily.   magic mouthwash (lidocaine, diphenhydrAMINE, alum & mag hydroxide) suspension Swish and swallow 5 mLs 4 (four) times daily as needed for mouth pain.   omeprazole (PRILOSEC) 40 MG capsule Take 1 capsule (40 mg total) by mouth 2 (two) times daily.   ONETOUCH ULTRA test strip USE TO CHECK BLOOD SUGAR ONCE DAILY IN THE MORNING   PARoxetine (PAXIL-CR) 25 MG 24 hr tablet Take 1 tablet by mouth once daily   rOPINIRole (REQUIP) 1 MG tablet TAKE 1 TABLET EVERY MORNINGAND 2 TABLETS AT BEDTIME   Spacer/Aero-Holding Chambers DEVI Use with inhaler   Tofacitinib Citrate (XELJANZ) 5 MG TABS Take 5 mg by mouth 2 (two) times daily. Take twice  a day.   Vitamin D, Ergocalciferol, (DRISDOL) 1.25 MG (50000 UNIT) CAPS capsule Take 1 capsule by mouth once a week   No facility-administered encounter medications on file as of 05/23/2022.    Allergies (verified) Sulfa antibiotics, Actemra [tocilizumab], Amlodipine, Infliximab, Leflunomide, Methotrexate derivatives, Niaspan [niacin], Sulfasalazine, Upadacitinib, and Welchol [colesevelam]   History: Past Medical History:  Diagnosis Date   Acute renal insufficiency    Asthma    Chronic bronchitis    COPD (chronic obstructive pulmonary  disease) (HCC)    Depression    History of migraine headaches    Hyperlipemia    OSA (obstructive sleep apnea)    pt states she doesn't have it   Osteoarthritis    Pneumonia    Rheumatoid arthritis(714.0)    RLS (restless legs syndrome)    Vitamin D deficiency disease    Past Surgical History:  Procedure Laterality Date   KNEE SURGERY Bilateral    when younger   TOTAL ABDOMINAL HYSTERECTOMY  06/1988   Family History  Problem Relation Age of Onset   Lung cancer Father    Stroke Mother    Hypertension Mother    Diabetes Mother    Rheum arthritis Sister    Rheum arthritis Sister    Social History   Socioeconomic History   Marital status: Married    Spouse name: Durene Fruits   Number of children: 3   Years of education: Not on file   Highest education level: Not on file  Occupational History   Occupation: retired    Comment: homemaker  Tobacco Use   Smoking status: Never    Passive exposure: Yes   Smokeless tobacco: Never  Vaping Use   Vaping Use: Never used  Substance and Sexual Activity   Alcohol use: No   Drug use: No   Sexual activity: Not Currently    Birth control/protection: None    Comment: Married  Other Topics Concern   Not on file  Social History Narrative   Lives with her husband, who is very helpful and supportive.   Social Determinants of Health   Financial Resource Strain: Low Risk  (05/23/2022)   Overall Financial Resource Strain (CARDIA)    Difficulty of Paying Living Expenses: Not hard at all  Food Insecurity: No Food Insecurity (05/23/2022)   Hunger Vital Sign    Worried About Running Out of Food in the Last Year: Never true    Ran Out of Food in the Last Year: Never true  Transportation Needs: No Transportation Needs (05/23/2022)   PRAPARE - Hydrologist (Medical): No    Lack of Transportation (Non-Medical): No  Physical Activity: Inactive (05/23/2022)   Exercise Vital Sign    Days of Exercise per Week: 0 days     Minutes of Exercise per Session: 0 min  Stress: No Stress Concern Present (05/23/2022)   [place]    Feeling of Stress : Only a little  Social Connections: Moderately Isolated (05/23/2022)   Social Connection and Isolation Panel [NHANES]    Frequency of Communication with Friends and Family: More than three times a week    Frequency of Social Gatherings with Friends and Family: More than three times a week    Attends Religious Services: Never    Marine scientist or Organizations: No    Attends Archivist Meetings: Never    Marital Status: Married    Tobacco Counseling Counseling given: Not  Answered   Clinical Intake:  Pre-visit preparation completed: No  Pain : No/denies pain Pain Score: 0-No pain     Nutritional Risks: None Diabetes: No  How often do you need to have someone help you when you read instructions, pamphlets, or other written materials from your doctor or pharmacy?: 1 - Never What is the last grade level you completed in school?: 12  Diabetic?no  Interpreter Needed?: No      Activities of Daily Living    05/23/2022    1:10 PM  In your present state of health, do you have any difficulty performing the following activities:  Hearing? 1  Vision? 1  Difficulty concentrating or making decisions? 0  Walking or climbing stairs? 1  Dressing or bathing? 1  Doing errands, shopping? 1  Preparing Food and eating ? N  Using the Toilet? Y  In the past six months, have you accidently leaked urine? N  Do you have problems with loss of bowel control? N  Managing your Medications? N  Managing your Finances? N  Housekeeping or managing your Housekeeping? N    Patient Care Team: [name], MD as PCP - General (Family Medicine) Hennie Duos, MD as Consulting Physician (Rheumatology) Lane Hacker, [facility] (Pharmacist)  Indicate any recent Medical Services you may have  received from other than [facility] providers in the past year (date may be approximate).     Assessment:   This is a routine wellness examination for [facility].  Hearing/Vision screen No results found.  Dietary issues and exercise activities discussed: Current Exercise Habits: The patient does not participate in regular exercise at present, Exercise limited by: orthopedic condition(s)   Goals Addressed   None   Depression Screen    05/23/2022    1:08 PM 05/07/2022    9:21 AM 12/04/2021    1:35 PM 03/30/2021   10:24 AM 01/08/2021    1:36 PM 12/15/2020   10:31 AM 07/27/2019    1:10 PM  PHQ 2/9 Scores  PHQ - 2 Score 0 2 1 0 0 0 0  PHQ- 9 Score  3         Fall Risk    05/23/2022    1:09 PM 05/07/2022    9:20 AM 03/30/2021   10:24 AM 01/08/2021    1:36 PM 12/17/2020   11:35 PM  [place] in the past year? 0 0 0 0   Number falls in past yr: 0 0 0 0   Injury with Fall? 0 0 1 0   Risk for fall due to : No Fall Risks Impaired mobility   History of fall(s);Impaired balance/gait  Follow up Falls evaluation completed;Education provided;Falls prevention discussed Falls prevention discussed   Falls evaluation completed;Falls prevention discussed    FALL RISK PREVENTION PERTAINING TO THE HOME:  Any stairs in or around the home? No  If so, are there any without handrails? No  Home free of loose throw rugs in walkways, pet beds, electrical cords, etc? Yes  Adequate lighting in your home to reduce risk of falls? Yes   ASSISTIVE DEVICES UTILIZED TO PREVENT FALLS:  Life alert? No  Use of a cane, walker or w/c? No  Grab bars in the bathroom? Yes  Shower chair or bench in shower? Yes  Elevated toilet seat or a handicapped toilet? Yes    Cognitive Function:        05/23/2022    1:13 PM  6CIT Screen  What Year? 0  points  What month? 0 points  What time? 0 points  Count back from 20 0 points  Months in reverse 0 points  Repeat phrase 2 points  Total Score 2 points     Immunizations Immunization History  Administered Date(s) Administered   Fluad Quad(high Dose 65+) 08/22/2021   Influenza Whole 08/12/2011   Influenza, High Dose Seasonal PF 08/12/2019, 08/01/2020   Moderna Covid-19 Vaccine Bivalent Booster 80yr & up 08/22/2021   Moderna SARS-COV2 Booster Vaccination 09/12/2020, 03/20/2021   Moderna Sars-Covid-2 Vaccination 11/26/2019, 12/24/2019   Pneumococcal Conjugate-13 09/08/2014   Pneumococcal Polysaccharide-23 10/05/2014   Tdap 10/01/2011    TDAP status: Due, Education has been provided regarding the importance of this vaccine. Advised may receive this vaccine at local pharmacy or Health Dept. Aware to provide a copy of the vaccination record if obtained from local pharmacy or Health Dept. Verbalized acceptance and understanding.  Flu Vaccine status: Up to date  Pneumococcal vaccine status: Up to date  Covid-19 vaccine status: Completed vaccines  Qualifies for Shingles Vaccine? Yes   Zostavax completed No   Shingrix Completed?: No.    Education has been provided regarding the importance of this vaccine. Patient has been advised to call insurance company to determine out of pocket expense if they have not yet received this vaccine. Advised may also receive vaccine at local pharmacy or Health Dept. Verbalized acceptance and understanding.  Screening Tests Health Maintenance  Topic Date Due   OPHTHALMOLOGY EXAM  Never done   TETANUS/TDAP  09/30/2021   COVID-19 Vaccine (4 - Booster for Moderna series) 10/17/2021   FOOT EXAM  12/15/2021   Zoster Vaccines- Shingrix (1 of 2) 08/23/2022 (Originally 10/15/1960)   INFLUENZA VACCINE  06/11/2022   HEMOGLOBIN A1C  11/06/2022   Pneumonia Vaccine 81 Years old  Completed   DEXA SCAN  Completed   HPV VACCINES  Aged Out    Health Maintenance  Health Maintenance Due  Topic Date Due   OPHTHALMOLOGY EXAM  Never done   TETANUS/TDAP  09/30/2021   COVID-19 Vaccine (4 - Booster for Moderna series)  10/17/2021   FOOT EXAM  12/15/2021    Colorectal cancer screening: No longer required.   Mammogram status: No longer required due to pt request.   Bone Density status: Completed 2019. Results reflect: Bone density results: OSTEOPENIA. Repeat every 2 years.  Lung Cancer Screening: (Low Dose CT Chest recommended if Age 81-80years, 30 pack-year currently smoking OR have quit w/in 15years.) does not qualify.   Lung Cancer Screening Referral: n/a  Additional Screening:  Hepatitis C Screening: does qualify;   Vision Screening: Recommended annual ophthalmology exams for early detection of glaucoma and other disorders of the eye. Is the patient up to date with their annual eye exam?  Yes  Who is the provider or what is the name of the [facility] which the [facility]    Dental Screening: Recommended annual dental exams for proper oral hygiene  Community Resource Referral / Chronic Care Management: CRR required this visit?  No   CCM required this visit?  No      Plan:     I have personally reviewed and noted the following in the patient's chart:   Medical and social history Use of alcohol, tobacco or illicit drugs  Current medications and supplements including opioid prescriptions.  Functional ability and status Nutritional status Physical activity Advanced directives List of other physicians Hospitalizations, surgeries, and ER visits in previous  12 months Vitals Screenings to include cognitive, depression, and falls Referrals and appointments  In addition, I have reviewed and discussed with patient certain preventive protocols, quality metrics, and best practice recommendations. A written personalized care plan for preventive services as well as general preventive health recommendations were provided to patient.     Perlie Mayo, NP   05/23/2022

## 2022-05-23 NOTE — Patient Instructions (Signed)
Sherry Burke , Thank you for taking time to come for your Medicare Wellness Visit. I appreciate your ongoing commitment to your health goals. Please review the following plan we discussed and let me know if I can assist you in the future.   Screening recommendations/referrals: Colonoscopy: No loner needed Mammogram: No longer desired Bone Density: Due -  Recommended yearly ophthalmology/optometry visit for glaucoma screening and checkup Recommended yearly dental visit for hygiene and checkup  Vaccinations: Influenza vaccine: Up to date Pneumococcal vaccine: Up to date Tdap vaccine: Due- can get at pharmacy Shingles vaccine: Due- can get at pharmacy  Advanced directives: Son is over her care   Conditions/risks identified: Falls   Next appointment: As scheduled   Preventive Care 27 Years and Older, Female Preventive care refers to lifestyle choices and visits with your health care provider that can promote health and wellness. What does preventive care include? A yearly physical exam. This is also called an annual well check. Dental exams once or twice a year. Routine eye exams. Ask your health care provider how often you should have your eyes checked. Personal lifestyle choices, including: Daily care of your teeth and gums. Regular physical activity. Eating a healthy diet. Avoiding tobacco and drug use. Limiting alcohol use. Practicing safe sex. Taking low-dose aspirin every day. Taking vitamin and mineral supplements as recommended by your health care provider. What happens during an annual well check? The services and screenings done by your health care provider during your annual well check will depend on your age, overall health, lifestyle risk factors, and family history of disease. Counseling  Your health care provider may ask you questions about your: Alcohol use. Tobacco use. Drug use. Emotional well-being. Home and relationship well-being. Sexual  activity. Eating habits. History of falls. Memory and ability to understand (cognition). Work and work Statistician. Reproductive health. Screening  You may have the following tests or measurements: Height, weight, and BMI. Blood pressure. Lipid and cholesterol levels. These may be checked every 5 years, or more frequently if you are over 9 years old. Skin check. Lung cancer screening. You may have this screening every year starting at age 31 if you have a 30-pack-year history of smoking and currently smoke or have quit within the past 15 years. Fecal occult blood test (FOBT) of the stool. You may have this test every year starting at age 75. Flexible sigmoidoscopy or colonoscopy. You may have a sigmoidoscopy every 5 years or a colonoscopy every 10 years starting at age 58. Hepatitis C blood test. Hepatitis B blood test. Sexually transmitted disease (STD) testing. Diabetes screening. This is done by checking your blood sugar (glucose) after you have not eaten for a while (fasting). You may have this done every 1-3 years. Bone density scan. This is done to screen for osteoporosis. You may have this done starting at age 55. Mammogram. This may be done every 1-2 years. Talk to your health care provider about how often you should have regular mammograms. Talk with your health care provider about your test results, treatment options, and if necessary, the need for more tests. Vaccines  Your health care provider may recommend certain vaccines, such as: Influenza vaccine. This is recommended every year. Tetanus, diphtheria, and acellular pertussis (Tdap, Td) vaccine. You may need a Td booster every 10 years. Zoster vaccine. You may need this after age 22. Pneumococcal 13-valent conjugate (PCV13) vaccine. One dose is recommended after age 37. Pneumococcal polysaccharide (PPSV23) vaccine. One dose is recommended after age 69.  Talk to your health care provider about which screenings and vaccines  you need and how often you need them. This information is not intended to replace advice given to you by your health care provider. Make sure you discuss any questions you have with your health care provider. Document Released: 11/24/2015 Document Revised: 07/17/2016 Document Reviewed: 08/29/2015 Elsevier Interactive Patient Education  2017 [place] Prevention in the Home Falls can cause injuries. They can happen to people of all ages. There are many things you can do to make your home safe and to help prevent falls. What can I do on the outside of my home? Regularly fix the edges of walkways and driveways and fix any cracks. Remove anything that might make you trip as you walk through a door, such as a raised step or threshold. Trim any bushes or trees on the path to your home. Use bright outdoor lighting. Clear any walking paths of anything that might make someone trip, such as rocks or tools. Regularly check to see if handrails are loose or broken. Make sure that both sides of any steps have handrails. Any raised decks and porches should have guardrails on the edges. Have any leaves, snow, or ice cleared regularly. Use sand or salt on walking paths during winter. Clean up any spills in your garage right away. This includes oil or grease spills. What can I do in the bathroom? Use night lights. Install grab bars by the toilet and in the tub and shower. Do not use towel bars as grab bars. Use non-skid mats or decals in the tub or shower. If you need to sit down in the shower, use a plastic, non-slip stool. Keep the floor dry. Clean up any water that spills on the floor as soon as it happens. Remove soap buildup in the tub or shower regularly. Attach bath mats securely with double-sided non-slip rug tape. Do not have throw rugs and other things on the floor that can make you trip. What can I do in the bedroom? Use night lights. Make sure that you have a light by your bed that  is easy to reach. Do not use any sheets or blankets that are too big for your bed. They should not hang down onto the floor. Have a firm chair that has side arms. You can use this for support while you get dressed. Do not have throw rugs and other things on the floor that can make you trip. What can I do in the kitchen? Clean up any spills right away. Avoid walking on wet floors. Keep items that you use a lot in easy-to-reach places. If you need to reach something above you, use a strong step stool that has a grab bar. Keep electrical cords out of the way. Do not use floor polish or wax that makes floors slippery. If you must use wax, use non-skid floor wax. Do not have throw rugs and other things on the floor that can make you trip. What can I do with my stairs? Do not leave any items on the stairs. Make sure that there are handrails on both sides of the stairs and use them. Fix handrails that are broken or loose. Make sure that handrails are as long as the stairways. Check any carpeting to make sure that it is firmly attached to the stairs. Fix any carpet that is loose or worn. Avoid having throw rugs at the top or bottom of the stairs. If you do have throw  rugs, attach them to the floor with carpet tape. Make sure that you have a light switch at the top of the stairs and the bottom of the stairs. If you do not have them, ask someone to add them for you. What else can I do to help prevent falls? Wear shoes that: Do not have high heels. Have rubber bottoms. Are comfortable and fit you well. Are closed at the toe. Do not wear sandals. If you use a stepladder: Make sure that it is fully opened. Do not climb a closed stepladder. Make sure that both sides of the stepladder are locked into place. Ask someone to hold it for you, if possible. Clearly mark and make sure that you can see: Any grab bars or handrails. First and last steps. Where the edge of each step is. Use tools that help you  move around (mobility aids) if they are needed. These include: Canes. Walkers. Scooters. Crutches. Turn on the lights when you go into a dark area. Replace any light bulbs as soon as they burn out. Set up your furniture so you have a clear path. Avoid moving your furniture around. If any of your floors are uneven, fix them. If there are any pets around you, be aware of where they are. Review your medicines with your doctor. Some medicines can make you feel dizzy. This can increase your chance of falling. Ask your doctor what other things that you can do to help prevent falls. This information is not intended to replace advice given to you by your health care provider. Make sure you discuss any questions you have with your health care provider. Document Released: 08/24/2009 Document Revised: 04/04/2016 Document Reviewed: 12/02/2014 Elsevier Interactive Patient Education  2017 [employer].

## 2022-05-25 LAB — SPECIMEN STATUS REPORT

## 2022-05-25 LAB — IRON AND TIBC

## 2022-05-25 LAB — FERRITIN

## 2022-05-28 NOTE — Progress Notes (Unsigned)
Acute Office Visit  Subjective:    Patient ID: Sherry Burke, female    DOB: Nov 05, 1941, 81 y.o.   MRN: [id number]  No chief complaint on file.   HPI: Patient is in today for swollen feet. Begins above ankle to toes. Redness in color with blisters. Denies shortness of breath, chest pain, chest tightness.   Past Medical History:  Diagnosis Date   Acute renal insufficiency    Asthma    Chronic bronchitis    COPD (chronic obstructive pulmonary disease) (HCC)    Depression    History of migraine headaches    Hyperlipemia    OSA (obstructive sleep apnea)    pt states she doesn't have it   Osteoarthritis    Pneumonia    Rheumatoid arthritis(714.0)    RLS (restless legs syndrome)    Vitamin D deficiency disease     Past Surgical History:  Procedure Laterality Date   KNEE SURGERY Bilateral    when younger   TOTAL ABDOMINAL HYSTERECTOMY  06/1988    Family History  Problem Relation Age of Onset   Lung cancer Father    Stroke Mother    Hypertension Mother    Diabetes Mother    Rheum arthritis Sister    Rheum arthritis Sister     Social History   Socioeconomic History   Marital status: Married    Spouse name: Durene Fruits   Number of children: 3   Years of education: Not on file   Highest education level: Not on file  Occupational History   Occupation: retired    Comment: homemaker  Tobacco Use   Smoking status: Never    Passive exposure: Yes   Smokeless tobacco: Never  Vaping Use   Vaping Use: Never used  Substance and Sexual Activity   Alcohol use: No   Drug use: No   Sexual activity: Not Currently    Birth control/protection: None    Comment: Married  Other Topics Concern   Not on file  Social History Narrative   Lives with her husband, who is very helpful and supportive.   Social Determinants of Health   Financial Resource Strain: Low Risk  (05/23/2022)   Overall Financial Resource Strain (CARDIA)    Difficulty of Paying Living Expenses: Not hard at all   Food Insecurity: No Food Insecurity (05/23/2022)   Hunger Vital Sign    Worried About Running Out of Food in the Last Year: Never true    Ran Out of Food in the Last Year: Never true  Transportation Needs: No Transportation Needs (05/23/2022)   PRAPARE - Hydrologist (Medical): No    Lack of Transportation (Non-Medical): No  Physical Activity: Inactive (05/23/2022)   Exercise Vital Sign    Days of Exercise per Week: 0 days    Minutes of Exercise per Session: 0 min  Stress: No Stress Concern Present (05/23/2022)   [place]    Feeling of Stress : Only a little  Social Connections: Moderately Isolated (05/23/2022)   Social Connection and Isolation Panel [NHANES]    Frequency of Communication with Friends and Family: More than three times a week    Frequency of Social Gatherings with Friends and Family: More than three times a week    Attends Religious Services: Never    Marine scientist or Organizations: No    Attends Archivist Meetings: Never    Marital Status: Married  Intimate  Partner Violence: Not At Risk (05/23/2022)   Humiliation, Afraid, Rape, and Kick questionnaire    Fear of Current or Ex-Partner: No    Emotionally Abused: No    Physically Abused: No    Sexually Abused: No    Outpatient Medications Prior to Visit  Medication Sig Dispense Refill   albuterol (VENTOLIN HFA) 108 (90 Base) MCG/ACT inhaler Inhale 2 puffs into the lungs every 6 (six) hours as needed for wheezing or shortness of breath. 18 g 11   atorvastatin (LIPITOR) 40 MG tablet Take 1 tablet by mouth once daily 90 tablet 0   buprenorphine (BUTRANS) 15 MCG/HR Place 1 patch onto the skin once a week. 4 patch 1   cloNIDine (CATAPRES) 0.1 MG tablet Take 1 tablet by mouth twice daily 180 tablet 0   cyanocobalamin 2000 MCG tablet Take 2,000 mcg by mouth daily.     famotidine (PEPCID) 20 MG tablet Take 1  tablet (20 mg total) by mouth at bedtime. 30 tablet 5   ferrous sulfate 325 (65 FE) MG tablet Take 325 mg by mouth daily with breakfast.     Fluticasone-Umeclidin-Vilant (TRELEGY ELLIPTA) 200-62.5-25 MCG/ACT AEPB Inhale 1 puff into the lungs daily. 180 each 3   furosemide (LASIX) 20 MG tablet Take 1 tablet (20 mg total) by mouth daily. 30 tablet 0   gabapentin (NEURONTIN) 300 MG capsule Take 1 capsule by mouth twice daily 180 capsule 0   HYDROcodone-acetaminophen (NORCO/VICODIN) 5-325 MG tablet Take 1 tablet by mouth every 6 (six) hours as needed for moderate pain. 120 tablet 0   hydroxychloroquine (PLAQUENIL) 200 MG tablet Take 1 tablet (200 mg total) by mouth 2 (two) times daily. 60 tablet 2   leflunomide (ARAVA) 20 MG tablet Take 20 mg by mouth daily.     magic mouthwash (lidocaine, diphenhydrAMINE, alum & mag hydroxide) suspension Swish and swallow 5 mLs 4 (four) times daily as needed for mouth pain. 360 mL 1   omeprazole (PRILOSEC) 40 MG capsule Take 1 capsule (40 mg total) by mouth 2 (two) times daily. 180 capsule 1   ONETOUCH ULTRA test strip USE TO CHECK BLOOD SUGAR ONCE DAILY IN THE MORNING     PARoxetine (PAXIL-CR) 25 MG 24 hr tablet Take 1 tablet by mouth once daily 90 tablet 1   rOPINIRole (REQUIP) 1 MG tablet TAKE 1 TABLET EVERY MORNINGAND 2 TABLETS AT BEDTIME 270 tablet 0   Spacer/Aero-Holding Chambers DEVI Use with inhaler 1 each 0   Tofacitinib Citrate (XELJANZ) 5 MG TABS Take 5 mg by mouth 2 (two) times daily. Take twice a day.     Vitamin D, Ergocalciferol, (DRISDOL) 1.25 MG (50000 UNIT) CAPS capsule Take 1 capsule by mouth once a week 12 capsule 0   No facility-administered medications prior to visit.    Allergies  Allergen Reactions   Sulfa Antibiotics Nausea And Vomiting   Actemra [Tocilizumab]    Amlodipine    Infliximab Nausea And Vomiting and Other (See Comments)    Affects nervous system Other reaction(s): vomiting   Leflunomide     Other reaction(s):  thrombocytopenia   Methotrexate Derivatives Other (See Comments)    sweating   Niaspan [Niacin]     Severe flushing   Sulfasalazine     Other reaction(s): stomach   Upadacitinib     Other reaction(s): elevated lfts   Welchol [Colesevelam] Other (See Comments)    Abdominal pain and headache    Review of Systems  Constitutional:  Negative for appetite change, fatigue  and fever.  HENT:  Negative for congestion, ear pain, sinus pressure and sore throat.   Respiratory:  Negative for cough, shortness of breath and wheezing.   Cardiovascular:  Positive for leg swelling. Negative for chest pain and palpitations.  Gastrointestinal:  Negative for abdominal pain, constipation, diarrhea, nausea and vomiting.  Genitourinary:  Negative for dysuria and frequency.  Musculoskeletal:  Negative for arthralgias, back pain, joint swelling and myalgias.  Skin:  Negative for rash.  Neurological:  Negative for dizziness, weakness and headaches.  Psychiatric/Behavioral:  Negative for dysphoric mood. The patient is not nervous/anxious.        Objective:    Physical Exam Vitals reviewed.  Constitutional:      Appearance: Normal appearance. She is normal weight.  HENT:     Right Ear: Tympanic membrane normal.     Left Ear: Tympanic membrane normal.     Nose: Nose normal.  Cardiovascular:     Rate and Rhythm: Normal rate and regular rhythm.     Pulses: Normal pulses.     Heart sounds: Normal heart sounds.  Pulmonary:     Effort: Pulmonary effort is normal.     Breath sounds: Normal breath sounds.  Abdominal:     General: Bowel sounds are normal.  Neurological:     Mental Status: She is oriented to person, place, and time. Mental status is at baseline.  Psychiatric:        Mood and Affect: Mood normal.        Behavior: Behavior normal.     There were no vitals taken for this visit. Wt Readings from Last 3 Encounters:  05/23/22 200 lb (90.7 kg)  05/07/22 200 lb (90.7 kg)  03/14/22 200 lb  (90.7 kg)    There are no preventive care reminders to display for this patient.  There are no preventive care reminders to display for this patient.   Lab Results  Component Value Date   TSH 2.080 03/30/2021   Lab Results  Component Value Date   WBC 10.0 05/07/2022   HGB 9.9 (L) 05/07/2022   HCT 30.7 (L) 05/07/2022   MCV 85 05/07/2022   PLT 220 05/07/2022   Lab Results  Component Value Date   NA 141 05/07/2022   K 4.0 05/07/2022   CO2 21 05/07/2022   GLUCOSE 108 (H) 05/07/2022   BUN 23 05/07/2022   CREATININE 1.37 (H) 05/07/2022   BILITOT 0.3 05/07/2022   ALKPHOS 61 05/07/2022   AST 21 05/07/2022   ALT 18 05/07/2022   PROT 6.7 05/07/2022   ALBUMIN 4.1 05/07/2022   CALCIUM 9.1 05/07/2022   EGFR 39 (L) 05/07/2022   GFR 37.52 (L) 01/08/2022   Lab Results  Component Value Date   CHOL 192 05/07/2022   Lab Results  Component Value Date   HDL 84 05/07/2022   Lab Results  Component Value Date   LDLCALC 89 05/07/2022   Lab Results  Component Value Date   TRIG 112 05/07/2022   Lab Results  Component Value Date   CHOLHDL 2.3 05/07/2022   Lab Results  Component Value Date   HGBA1C 6.4 (H) 05/07/2022       Assessment & Plan:   Problem List Items Addressed This Visit   None  No orders of the defined types were placed in this encounter.   No orders of the defined types were placed in this encounter.    Follow-up: No follow-ups on file.  An After Visit Summary was printed and  given to the patient.  I,Tasheena Wambolt C Caitlen Worth,acting as a scribe for [name], MD.,have documented all relevant documentation on the behalf of [name], MD,as directed by  [name], MD while in the presence of [name], MD.   [name], MD [place] [phone]

## 2022-05-29 ENCOUNTER — Ambulatory Visit: Payer: Medicare Other | Admitting: Family Medicine

## 2022-05-30 ENCOUNTER — Telehealth: Payer: Self-pay

## 2022-05-30 DIAGNOSIS — G609 Hereditary and idiopathic neuropathy, unspecified: Secondary | ICD-10-CM | POA: Diagnosis not present

## 2022-05-30 DIAGNOSIS — R6883 Chills (without fever): Secondary | ICD-10-CM | POA: Diagnosis not present

## 2022-05-30 DIAGNOSIS — R778 Other specified abnormalities of plasma proteins: Secondary | ICD-10-CM | POA: Diagnosis not present

## 2022-05-30 DIAGNOSIS — I272 Pulmonary hypertension, unspecified: Secondary | ICD-10-CM | POA: Diagnosis not present

## 2022-05-30 DIAGNOSIS — N390 Urinary tract infection, site not specified: Secondary | ICD-10-CM | POA: Diagnosis not present

## 2022-05-30 DIAGNOSIS — I351 Nonrheumatic aortic (valve) insufficiency: Secondary | ICD-10-CM | POA: Diagnosis not present

## 2022-05-30 DIAGNOSIS — N184 Chronic kidney disease, stage 4 (severe): Secondary | ICD-10-CM | POA: Diagnosis not present

## 2022-05-30 DIAGNOSIS — I10 Essential (primary) hypertension: Secondary | ICD-10-CM | POA: Diagnosis not present

## 2022-05-30 DIAGNOSIS — M545 Low back pain, unspecified: Secondary | ICD-10-CM | POA: Diagnosis not present

## 2022-05-30 DIAGNOSIS — I248 Other forms of acute ischemic heart disease: Secondary | ICD-10-CM | POA: Diagnosis not present

## 2022-05-30 DIAGNOSIS — R279 Unspecified lack of coordination: Secondary | ICD-10-CM | POA: Diagnosis not present

## 2022-05-30 DIAGNOSIS — Z7969 Long term (current) use of other immunomodulators and immunosuppressants: Secondary | ICD-10-CM | POA: Diagnosis not present

## 2022-05-30 DIAGNOSIS — M6281 Muscle weakness (generalized): Secondary | ICD-10-CM | POA: Diagnosis not present

## 2022-05-30 DIAGNOSIS — M7989 Other specified soft tissue disorders: Secondary | ICD-10-CM | POA: Diagnosis not present

## 2022-05-30 DIAGNOSIS — N189 Chronic kidney disease, unspecified: Secondary | ICD-10-CM | POA: Diagnosis not present

## 2022-05-30 DIAGNOSIS — G4730 Sleep apnea, unspecified: Secondary | ICD-10-CM | POA: Diagnosis present

## 2022-05-30 DIAGNOSIS — K449 Diaphragmatic hernia without obstruction or gangrene: Secondary | ICD-10-CM | POA: Diagnosis not present

## 2022-05-30 DIAGNOSIS — E785 Hyperlipidemia, unspecified: Secondary | ICD-10-CM | POA: Diagnosis not present

## 2022-05-30 DIAGNOSIS — M069 Rheumatoid arthritis, unspecified: Secondary | ICD-10-CM | POA: Diagnosis not present

## 2022-05-30 DIAGNOSIS — M4316 Spondylolisthesis, lumbar region: Secondary | ICD-10-CM | POA: Diagnosis not present

## 2022-05-30 DIAGNOSIS — R1310 Dysphagia, unspecified: Secondary | ICD-10-CM | POA: Diagnosis not present

## 2022-05-30 DIAGNOSIS — R2689 Other abnormalities of gait and mobility: Secondary | ICD-10-CM | POA: Diagnosis present

## 2022-05-30 DIAGNOSIS — I5022 Chronic systolic (congestive) heart failure: Secondary | ICD-10-CM | POA: Diagnosis not present

## 2022-05-30 DIAGNOSIS — D631 Anemia in chronic kidney disease: Secondary | ICD-10-CM | POA: Diagnosis present

## 2022-05-30 DIAGNOSIS — I129 Hypertensive chronic kidney disease with stage 1 through stage 4 chronic kidney disease, or unspecified chronic kidney disease: Secondary | ICD-10-CM | POA: Diagnosis not present

## 2022-05-30 DIAGNOSIS — M810 Age-related osteoporosis without current pathological fracture: Secondary | ICD-10-CM | POA: Diagnosis not present

## 2022-05-30 DIAGNOSIS — M160 Bilateral primary osteoarthritis of hip: Secondary | ICD-10-CM | POA: Diagnosis not present

## 2022-05-30 DIAGNOSIS — N179 Acute kidney failure, unspecified: Secondary | ICD-10-CM | POA: Diagnosis not present

## 2022-05-30 DIAGNOSIS — F32A Depression, unspecified: Secondary | ICD-10-CM | POA: Diagnosis not present

## 2022-05-30 DIAGNOSIS — Z7401 Bed confinement status: Secondary | ICD-10-CM | POA: Diagnosis not present

## 2022-05-30 DIAGNOSIS — J9811 Atelectasis: Secondary | ICD-10-CM | POA: Diagnosis not present

## 2022-05-30 DIAGNOSIS — K5730 Diverticulosis of large intestine without perforation or abscess without bleeding: Secondary | ICD-10-CM | POA: Diagnosis not present

## 2022-05-30 DIAGNOSIS — I361 Nonrheumatic tricuspid (valve) insufficiency: Secondary | ICD-10-CM | POA: Diagnosis not present

## 2022-05-30 DIAGNOSIS — I130 Hypertensive heart and chronic kidney disease with heart failure and stage 1 through stage 4 chronic kidney disease, or unspecified chronic kidney disease: Secondary | ICD-10-CM | POA: Diagnosis not present

## 2022-05-30 DIAGNOSIS — R109 Unspecified abdominal pain: Secondary | ICD-10-CM | POA: Diagnosis not present

## 2022-05-30 DIAGNOSIS — R079 Chest pain, unspecified: Secondary | ICD-10-CM | POA: Diagnosis not present

## 2022-05-30 DIAGNOSIS — W19XXXA Unspecified fall, initial encounter: Secondary | ICD-10-CM | POA: Diagnosis not present

## 2022-05-30 DIAGNOSIS — J984 Other disorders of lung: Secondary | ICD-10-CM | POA: Diagnosis not present

## 2022-05-30 DIAGNOSIS — I214 Non-ST elevation (NSTEMI) myocardial infarction: Secondary | ICD-10-CM | POA: Diagnosis not present

## 2022-05-30 DIAGNOSIS — M1990 Unspecified osteoarthritis, unspecified site: Secondary | ICD-10-CM | POA: Diagnosis not present

## 2022-05-30 DIAGNOSIS — M75122 Complete rotator cuff tear or rupture of left shoulder, not specified as traumatic: Secondary | ICD-10-CM | POA: Diagnosis not present

## 2022-05-30 DIAGNOSIS — E7800 Pure hypercholesterolemia, unspecified: Secondary | ICD-10-CM | POA: Diagnosis present

## 2022-05-30 DIAGNOSIS — R5381 Other malaise: Secondary | ICD-10-CM | POA: Diagnosis not present

## 2022-05-30 DIAGNOSIS — M4186 Other forms of scoliosis, lumbar region: Secondary | ICD-10-CM | POA: Diagnosis not present

## 2022-05-30 DIAGNOSIS — M19012 Primary osteoarthritis, left shoulder: Secondary | ICD-10-CM | POA: Diagnosis not present

## 2022-05-30 DIAGNOSIS — R600 Localized edema: Secondary | ICD-10-CM | POA: Diagnosis not present

## 2022-05-30 DIAGNOSIS — I5033 Acute on chronic diastolic (congestive) heart failure: Secondary | ICD-10-CM | POA: Diagnosis not present

## 2022-05-30 DIAGNOSIS — R112 Nausea with vomiting, unspecified: Secondary | ICD-10-CM | POA: Diagnosis not present

## 2022-05-30 DIAGNOSIS — R531 Weakness: Secondary | ICD-10-CM | POA: Diagnosis not present

## 2022-05-30 DIAGNOSIS — I447 Left bundle-branch block, unspecified: Secondary | ICD-10-CM | POA: Diagnosis not present

## 2022-05-30 DIAGNOSIS — Z888 Allergy status to other drugs, medicaments and biological substances status: Secondary | ICD-10-CM | POA: Diagnosis not present

## 2022-05-30 DIAGNOSIS — E876 Hypokalemia: Secondary | ICD-10-CM | POA: Diagnosis present

## 2022-05-30 DIAGNOSIS — M25412 Effusion, left shoulder: Secondary | ICD-10-CM | POA: Diagnosis not present

## 2022-05-30 DIAGNOSIS — J8410 Pulmonary fibrosis, unspecified: Secondary | ICD-10-CM | POA: Diagnosis not present

## 2022-05-30 DIAGNOSIS — R509 Fever, unspecified: Secondary | ICD-10-CM | POA: Diagnosis not present

## 2022-05-30 DIAGNOSIS — Z79899 Other long term (current) drug therapy: Secondary | ICD-10-CM | POA: Diagnosis not present

## 2022-05-30 DIAGNOSIS — L03115 Cellulitis of right lower limb: Secondary | ICD-10-CM | POA: Diagnosis not present

## 2022-05-30 DIAGNOSIS — I340 Nonrheumatic mitral (valve) insufficiency: Secondary | ICD-10-CM | POA: Diagnosis not present

## 2022-05-30 DIAGNOSIS — J704 Drug-induced interstitial lung disorders, unspecified: Secondary | ICD-10-CM | POA: Diagnosis not present

## 2022-05-30 DIAGNOSIS — K219 Gastro-esophageal reflux disease without esophagitis: Secondary | ICD-10-CM | POA: Diagnosis not present

## 2022-05-30 DIAGNOSIS — M75102 Unspecified rotator cuff tear or rupture of left shoulder, not specified as traumatic: Secondary | ICD-10-CM | POA: Diagnosis not present

## 2022-05-30 DIAGNOSIS — I119 Hypertensive heart disease without heart failure: Secondary | ICD-10-CM | POA: Diagnosis not present

## 2022-05-30 DIAGNOSIS — R0602 Shortness of breath: Secondary | ICD-10-CM | POA: Diagnosis not present

## 2022-05-30 DIAGNOSIS — K8689 Other specified diseases of pancreas: Secondary | ICD-10-CM | POA: Diagnosis not present

## 2022-05-30 DIAGNOSIS — D649 Anemia, unspecified: Secondary | ICD-10-CM | POA: Diagnosis not present

## 2022-05-30 DIAGNOSIS — Z79891 Long term (current) use of opiate analgesic: Secondary | ICD-10-CM | POA: Diagnosis not present

## 2022-05-30 DIAGNOSIS — Z87440 Personal history of urinary (tract) infections: Secondary | ICD-10-CM | POA: Diagnosis not present

## 2022-05-30 DIAGNOSIS — M5136 Other intervertebral disc degeneration, lumbar region: Secondary | ICD-10-CM | POA: Diagnosis not present

## 2022-05-30 NOTE — Telephone Encounter (Signed)
Patient son called and canceled appointment for Monday, stated Patient was taking to [place] hospital, she has had an heart attack is is being sent to [place].

## 2022-05-31 DIAGNOSIS — I447 Left bundle-branch block, unspecified: Secondary | ICD-10-CM | POA: Diagnosis not present

## 2022-05-31 DIAGNOSIS — D649 Anemia, unspecified: Secondary | ICD-10-CM | POA: Diagnosis not present

## 2022-05-31 DIAGNOSIS — I119 Hypertensive heart disease without heart failure: Secondary | ICD-10-CM | POA: Diagnosis not present

## 2022-05-31 DIAGNOSIS — N189 Chronic kidney disease, unspecified: Secondary | ICD-10-CM | POA: Diagnosis not present

## 2022-05-31 DIAGNOSIS — R778 Other specified abnormalities of plasma proteins: Secondary | ICD-10-CM | POA: Diagnosis not present

## 2022-06-01 DIAGNOSIS — D649 Anemia, unspecified: Secondary | ICD-10-CM | POA: Diagnosis not present

## 2022-06-01 DIAGNOSIS — N189 Chronic kidney disease, unspecified: Secondary | ICD-10-CM | POA: Diagnosis not present

## 2022-06-01 DIAGNOSIS — R778 Other specified abnormalities of plasma proteins: Secondary | ICD-10-CM | POA: Diagnosis not present

## 2022-06-01 DIAGNOSIS — I119 Hypertensive heart disease without heart failure: Secondary | ICD-10-CM | POA: Diagnosis not present

## 2022-06-02 DIAGNOSIS — R778 Other specified abnormalities of plasma proteins: Secondary | ICD-10-CM | POA: Diagnosis not present

## 2022-06-02 DIAGNOSIS — N189 Chronic kidney disease, unspecified: Secondary | ICD-10-CM | POA: Diagnosis not present

## 2022-06-02 DIAGNOSIS — D649 Anemia, unspecified: Secondary | ICD-10-CM | POA: Diagnosis not present

## 2022-06-02 DIAGNOSIS — I119 Hypertensive heart disease without heart failure: Secondary | ICD-10-CM | POA: Diagnosis not present

## 2022-06-02 NOTE — Telephone Encounter (Signed)
Sherry Burke, spoke with patient. His wife is still at [facility] for heart attack. Dr Tobie Poet

## 2022-06-03 ENCOUNTER — Ambulatory Visit: Payer: Medicare Other | Admitting: Family Medicine

## 2022-06-03 DIAGNOSIS — I119 Hypertensive heart disease without heart failure: Secondary | ICD-10-CM | POA: Diagnosis not present

## 2022-06-03 DIAGNOSIS — D649 Anemia, unspecified: Secondary | ICD-10-CM | POA: Diagnosis not present

## 2022-06-03 DIAGNOSIS — E785 Hyperlipidemia, unspecified: Secondary | ICD-10-CM | POA: Diagnosis not present

## 2022-06-03 DIAGNOSIS — R778 Other specified abnormalities of plasma proteins: Secondary | ICD-10-CM | POA: Diagnosis not present

## 2022-06-04 DIAGNOSIS — E785 Hyperlipidemia, unspecified: Secondary | ICD-10-CM | POA: Diagnosis not present

## 2022-06-04 DIAGNOSIS — D649 Anemia, unspecified: Secondary | ICD-10-CM | POA: Diagnosis not present

## 2022-06-04 DIAGNOSIS — I119 Hypertensive heart disease without heart failure: Secondary | ICD-10-CM | POA: Diagnosis not present

## 2022-06-04 DIAGNOSIS — R778 Other specified abnormalities of plasma proteins: Secondary | ICD-10-CM | POA: Diagnosis not present

## 2022-06-05 ENCOUNTER — Ambulatory Visit: Payer: Medicare Other | Admitting: Family Medicine

## 2022-06-07 ENCOUNTER — Other Ambulatory Visit: Payer: Self-pay | Admitting: Family Medicine

## 2022-06-07 DIAGNOSIS — F331 Major depressive disorder, recurrent, moderate: Secondary | ICD-10-CM

## 2022-06-12 ENCOUNTER — Telehealth: Payer: Medicare Other

## 2022-06-12 DIAGNOSIS — N189 Chronic kidney disease, unspecified: Secondary | ICD-10-CM | POA: Diagnosis not present

## 2022-06-12 DIAGNOSIS — I119 Hypertensive heart disease without heart failure: Secondary | ICD-10-CM | POA: Diagnosis not present

## 2022-06-12 DIAGNOSIS — I5033 Acute on chronic diastolic (congestive) heart failure: Secondary | ICD-10-CM | POA: Diagnosis not present

## 2022-06-12 DIAGNOSIS — R279 Unspecified lack of coordination: Secondary | ICD-10-CM | POA: Diagnosis not present

## 2022-06-12 DIAGNOSIS — R2689 Other abnormalities of gait and mobility: Secondary | ICD-10-CM | POA: Diagnosis not present

## 2022-06-12 DIAGNOSIS — D649 Anemia, unspecified: Secondary | ICD-10-CM | POA: Diagnosis not present

## 2022-06-12 DIAGNOSIS — N179 Acute kidney failure, unspecified: Secondary | ICD-10-CM | POA: Diagnosis not present

## 2022-06-12 DIAGNOSIS — M6281 Muscle weakness (generalized): Secondary | ICD-10-CM | POA: Diagnosis not present

## 2022-06-12 DIAGNOSIS — N184 Chronic kidney disease, stage 4 (severe): Secondary | ICD-10-CM | POA: Diagnosis not present

## 2022-06-12 DIAGNOSIS — N1831 Chronic kidney disease, stage 3a: Secondary | ICD-10-CM | POA: Diagnosis not present

## 2022-06-12 DIAGNOSIS — M1990 Unspecified osteoarthritis, unspecified site: Secondary | ICD-10-CM | POA: Diagnosis not present

## 2022-06-12 DIAGNOSIS — M75102 Unspecified rotator cuff tear or rupture of left shoulder, not specified as traumatic: Secondary | ICD-10-CM | POA: Diagnosis not present

## 2022-06-12 DIAGNOSIS — I129 Hypertensive chronic kidney disease with stage 1 through stage 4 chronic kidney disease, or unspecified chronic kidney disease: Secondary | ICD-10-CM | POA: Diagnosis not present

## 2022-06-12 DIAGNOSIS — R531 Weakness: Secondary | ICD-10-CM | POA: Diagnosis not present

## 2022-06-12 DIAGNOSIS — R262 Difficulty in walking, not elsewhere classified: Secondary | ICD-10-CM | POA: Diagnosis not present

## 2022-06-12 DIAGNOSIS — I10 Essential (primary) hypertension: Secondary | ICD-10-CM | POA: Diagnosis not present

## 2022-06-12 DIAGNOSIS — F32A Depression, unspecified: Secondary | ICD-10-CM | POA: Diagnosis not present

## 2022-06-12 DIAGNOSIS — R1310 Dysphagia, unspecified: Secondary | ICD-10-CM | POA: Diagnosis not present

## 2022-06-12 DIAGNOSIS — J704 Drug-induced interstitial lung disorders, unspecified: Secondary | ICD-10-CM | POA: Diagnosis not present

## 2022-06-12 DIAGNOSIS — M4316 Spondylolisthesis, lumbar region: Secondary | ICD-10-CM | POA: Diagnosis not present

## 2022-06-12 DIAGNOSIS — I130 Hypertensive heart and chronic kidney disease with heart failure and stage 1 through stage 4 chronic kidney disease, or unspecified chronic kidney disease: Secondary | ICD-10-CM | POA: Diagnosis not present

## 2022-06-12 DIAGNOSIS — I214 Non-ST elevation (NSTEMI) myocardial infarction: Secondary | ICD-10-CM | POA: Diagnosis not present

## 2022-06-12 DIAGNOSIS — M810 Age-related osteoporosis without current pathological fracture: Secondary | ICD-10-CM | POA: Diagnosis not present

## 2022-06-12 DIAGNOSIS — K219 Gastro-esophageal reflux disease without esophagitis: Secondary | ICD-10-CM | POA: Diagnosis not present

## 2022-06-12 DIAGNOSIS — G609 Hereditary and idiopathic neuropathy, unspecified: Secondary | ICD-10-CM | POA: Diagnosis not present

## 2022-06-12 DIAGNOSIS — R5381 Other malaise: Secondary | ICD-10-CM | POA: Diagnosis not present

## 2022-06-12 DIAGNOSIS — J8410 Pulmonary fibrosis, unspecified: Secondary | ICD-10-CM | POA: Diagnosis not present

## 2022-06-12 DIAGNOSIS — E785 Hyperlipidemia, unspecified: Secondary | ICD-10-CM | POA: Diagnosis not present

## 2022-06-12 DIAGNOSIS — M069 Rheumatoid arthritis, unspecified: Secondary | ICD-10-CM | POA: Diagnosis not present

## 2022-06-12 DIAGNOSIS — I5022 Chronic systolic (congestive) heart failure: Secondary | ICD-10-CM | POA: Diagnosis not present

## 2022-06-12 DIAGNOSIS — Z7401 Bed confinement status: Secondary | ICD-10-CM | POA: Diagnosis not present

## 2022-06-14 DIAGNOSIS — N1831 Chronic kidney disease, stage 3a: Secondary | ICD-10-CM | POA: Diagnosis not present

## 2022-06-14 DIAGNOSIS — D649 Anemia, unspecified: Secondary | ICD-10-CM | POA: Diagnosis not present

## 2022-06-14 DIAGNOSIS — I214 Non-ST elevation (NSTEMI) myocardial infarction: Secondary | ICD-10-CM | POA: Diagnosis not present

## 2022-06-14 DIAGNOSIS — R262 Difficulty in walking, not elsewhere classified: Secondary | ICD-10-CM | POA: Diagnosis not present

## 2022-06-26 ENCOUNTER — Ambulatory Visit: Payer: Medicare Other | Admitting: Pulmonary Disease

## 2022-06-28 ENCOUNTER — Other Ambulatory Visit: Payer: Self-pay

## 2022-06-28 DIAGNOSIS — M150 Primary generalized (osteo)arthritis: Secondary | ICD-10-CM

## 2022-06-28 DIAGNOSIS — M48062 Spinal stenosis, lumbar region with neurogenic claudication: Secondary | ICD-10-CM

## 2022-06-28 MED ORDER — HYDROCODONE-ACETAMINOPHEN 5-325 MG PO TABS
1.0000 | ORAL_TABLET | Freq: Four times a day (QID) | ORAL | 0 refills | Status: DC | PRN
Start: 2022-06-28 — End: 2022-07-30

## 2022-07-01 ENCOUNTER — Telehealth: Payer: Self-pay

## 2022-07-01 ENCOUNTER — Other Ambulatory Visit: Payer: Self-pay | Admitting: *Deleted

## 2022-07-01 ENCOUNTER — Other Ambulatory Visit: Payer: Self-pay

## 2022-07-01 NOTE — Telephone Encounter (Signed)
  Transition Care Management Follow-up Telephone Call    Sherry Burke Jun 04, 1941  Admit Date: 06/12/2022 Discharge Date: 06/28/2022 Discharged from where: [facility]   Diagnoses: Acute Kidney Injury, Left Rotator cuff tear, Chronic anemia, Non-ST elevated myocardial infarction, UTI, Chronic kidney disease, Hyperlipidemia, Hypertension, Rheumatoid Arthritis, GERD, Hypertensive Arteriosclerotic Cardiovascular Disease, methotrexate lung, Pulmonary fibrosis.      2 day post discharge: 07/01/2022   Sherry Burke was discharged from [facility]  on Friday, Jun 28, 2022  with the diagnoses listed above. She was contacted today via telephone in regards to transition of care.     Discharge Instructions:  Continue current medication as instructions.  Follow-up with Primary Care as scheduled.    Items Reviewed: Did the pt receive and understand the discharge instructions provided? Yes  Medications obtained and verified?  Patient is going to bring her medication with her at the scheduled appointment time.   Other? No  Any new allergies since your discharge? No  Dietary orders reviewed? No Do you have support at home? Yes   Home Care and Equipment/Supplies: Were home health services ordered? no If so, what is the name of the agency? Not applicable  Has the agency set up a time to come to the patient's home? not applicable Were any new equipment or medical supplies ordered?  No What is the name of the medical supply agency? Not applicable  Were you able to get the supplies/equipment? not applicable Do you have any questions related to the use of the equipment or supplies? No  Functional Questionnaire: (I = Independent and D = Dependent) ADLs: Family help with patient's needs if needed.    Bathing/Dressing- Independent  Meal Prep- Independent   Eating- Independent   Maintaining continence- poor bladder control   Transferring/Ambulation- ambulates with  assistance of cane.    Managing Meds- Gets assistance from her son   Any patient concerns? no  Follow up appointments reviewed: PCP Hospital f/u appt confirmed? Yes  Scheduled to see Dr. Tobie Poet  on 07/05/2022 @ 11 o'clock.  . Are transportation arrangements needed?  Patient's son will transport.   If their condition worsens, is the pt aware to call PCP or go to the Emergency Dept.? Yes Was the patient provided with contact information for the PCP's office/after hours number? Yes Was to pt encouraged to call back with questions or concerns? Yes    07/01/22 11:56 AM

## 2022-07-01 NOTE — Patient Outreach (Signed)
  Care Coordination [facility] Note Transition Care Management Follow-up Telephone Call Date of discharge and from where: 06/29/19 [facility] How have you been since you were released from the hospital? Patient states she is feeling better.  Any questions or concerns? No  Items Reviewed: Did the pt receive and understand the discharge instructions provided? Yes  Medications obtained and verified? Yes  Other? No  Any new allergies since your discharge? No  Dietary orders reviewed? Yes Do you have support at home? Yes   Home Care and Equipment/Supplies: Were home health services ordered? no If so, what is the name of the agency? N/A  Has the agency set up a time to come to the patient's home? not applicable Were any new equipment or medical supplies ordered?  No What is the name of the medical supply agency? N/a Were you able to get the supplies/equipment? not applicable Do you have any questions related to the use of the equipment or supplies? No  Functional Questionnaire: (I = Independent and D = Dependent) ADLs: D  Bathing/Dressing- D  Meal Prep- D  Eating- I  Maintaining continence- I  Transferring/Ambulation- D  Managing Meds- I  Follow up appointments reviewed:  PCP Hospital f/u appt confirmed? Yes  Scheduled to see Dr. Tobie Poet on 07/05/22 @ 1100. [facility] f/u appt confirmed? No   Are transportation arrangements needed? No  If their condition worsens, is the pt aware to call PCP or go to the Emergency Dept.? Yes Was the patient provided with contact information for the PCP's office or ED? Yes Was to pt encouraged to call back with questions or concerns? Yes  SDOH assessments and interventions completed:   No  Care Coordination Interventions Activated:  No   Care Coordination Interventions:   N/A     Encounter Outcome:  Pt. Visit Completed    Emelia Loron RN, BSN [place] [phone] [email or web address]

## 2022-07-05 ENCOUNTER — Other Ambulatory Visit: Payer: Self-pay | Admitting: Family Medicine

## 2022-07-05 ENCOUNTER — Ambulatory Visit (INDEPENDENT_AMBULATORY_CARE_PROVIDER_SITE_OTHER): Payer: Medicare Other | Admitting: Family Medicine

## 2022-07-05 VITALS — BP 132/68 | HR 72 | Temp 97.1°F | Resp 18 | Ht 60.0 in | Wt 196.0 lb

## 2022-07-05 DIAGNOSIS — Z6838 Body mass index (BMI) 38.0-38.9, adult: Secondary | ICD-10-CM

## 2022-07-05 DIAGNOSIS — I152 Hypertension secondary to endocrine disorders: Secondary | ICD-10-CM | POA: Diagnosis not present

## 2022-07-05 DIAGNOSIS — D638 Anemia in other chronic diseases classified elsewhere: Secondary | ICD-10-CM | POA: Diagnosis not present

## 2022-07-05 DIAGNOSIS — D696 Thrombocytopenia, unspecified: Secondary | ICD-10-CM

## 2022-07-05 DIAGNOSIS — E1159 Type 2 diabetes mellitus with other circulatory complications: Secondary | ICD-10-CM | POA: Diagnosis not present

## 2022-07-05 DIAGNOSIS — N3001 Acute cystitis with hematuria: Secondary | ICD-10-CM

## 2022-07-05 LAB — POCT URINALYSIS DIPSTICK
Bilirubin, UA: NEGATIVE
Glucose, UA: NEGATIVE
Ketones, UA: NEGATIVE
Nitrite, UA: POSITIVE
Protein, UA: POSITIVE — AB
Spec Grav, UA: 1.02 (ref 1.010–1.025)
Urobilinogen, UA: 0.2 E.U./dL
pH, UA: 6 (ref 5.0–8.0)

## 2022-07-05 MED ORDER — NITROFURANTOIN MONOHYD MACRO 100 MG PO CAPS: ORAL_CAPSULE | ORAL | 0 refills | Status: DC

## 2022-07-05 MED ORDER — CEFTRIAXONE SODIUM 1 G IJ SOLR
1.0000 g | Freq: Once | INTRAMUSCULAR | Status: AC
  Administered 2022-07-05: 1 g via INTRAMUSCULAR

## 2022-07-05 NOTE — Patient Instructions (Signed)
Start on macrobid 100 mg one twice a day x 1 week, then drop to once daily.   Continue singulair.  Hold isosorbide, quetiapine, farxiga, and hydralazine.  Check bp daily.

## 2022-07-06 LAB — COMPREHENSIVE METABOLIC PANEL
ALT: 24 IU/L (ref 0–32)
AST: 21 IU/L (ref 0–40)
Albumin/Globulin Ratio: 2 (ref 1.2–2.2)
Albumin: 4.3 g/dL (ref 3.8–4.8)
Alkaline Phosphatase: 70 IU/L (ref 44–121)
BUN/Creatinine Ratio: 19 (ref 12–28)
BUN: 26 mg/dL (ref 8–27)
Bilirubin Total: 0.3 mg/dL (ref 0.0–1.2)
CO2: 21 mmol/L (ref 20–29)
Calcium: 9.3 mg/dL (ref 8.7–10.3)
Chloride: 106 mmol/L (ref 96–106)
Creatinine, Ser: 1.34 mg/dL — ABNORMAL HIGH (ref 0.57–1.00)
Globulin, Total: 2.2 g/dL (ref 1.5–4.5)
Glucose: 87 mg/dL (ref 70–99)
Potassium: 4.6 mmol/L (ref 3.5–5.2)
Sodium: 144 mmol/L (ref 134–144)
Total Protein: 6.5 g/dL (ref 6.0–8.5)
eGFR: 40 mL/min/{1.73_m2} — ABNORMAL LOW (ref >59)

## 2022-07-06 LAB — VITAMIN D 25 HYDROXY (VIT D DEFICIENCY, FRACTURES): Vit D, 25-Hydroxy: 35.4 ng/mL (ref 30.0–100.0)

## 2022-07-06 LAB — CBC WITH DIFFERENTIAL/PLATELET
Basophils Absolute: 0.1 10*3/uL (ref 0.0–0.2)
Basos: 1 %
EOS (ABSOLUTE): 0.1 10*3/uL (ref 0.0–0.4)
Eos: 1 %
Hematocrit: 31.5 % — ABNORMAL LOW (ref 34.0–46.6)
Hemoglobin: 10.1 g/dL — ABNORMAL LOW (ref 11.1–15.9)
Immature Grans (Abs): 0.2 10*3/uL — ABNORMAL HIGH (ref 0.0–0.1)
Immature Granulocytes: 2 %
Lymphocytes Absolute: 1.2 10*3/uL (ref 0.7–3.1)
Lymphs: 13 %
MCH: 27.9 pg (ref 26.6–33.0)
MCHC: 32.1 g/dL (ref 31.5–35.7)
MCV: 87 fL (ref 79–97)
Monocytes Absolute: 0.7 10*3/uL (ref 0.1–0.9)
Monocytes: 7 %
Neutrophils Absolute: 7.6 10*3/uL — ABNORMAL HIGH (ref 1.4–7.0)
Neutrophils: 76 %
Platelets: 192 10*3/uL (ref 150–450)
RBC: 3.62 x10E6/uL — ABNORMAL LOW (ref 3.77–5.28)
RDW: 17.3 % — ABNORMAL HIGH (ref 11.7–15.4)
WBC: 9.8 10*3/uL (ref 3.4–10.8)

## 2022-07-09 LAB — URINE CULTURE

## 2022-07-12 ENCOUNTER — Encounter: Payer: Self-pay | Admitting: Family Medicine

## 2022-07-12 NOTE — Progress Notes (Unsigned)
Subjective:  Patient ID: Sherry Burke, female    DOB: 1941-01-04  Age: 81 y.o. MRN: [id number]  Chief Complaint  Patient presents with   Hospitalization Follow-up    HPI Patient was hospitalized at [facility] for AKI, rotator cuff tear, anemia, NSTEMI, and UTI. She was discharged on 06/12/22 with foley catheter with recommended follow up with urology. Discharged to [facility], she discharged from their care on 06/28/2022 to home. She did very well in rehab and is doing very well since coming home. Patient has held isosorbide, quetiapine, farxiga, and hydralazine. These were on the hospital discharge, but not the SNF discharge. She is feeling very well without the medicines.   Current Outpatient Medications on File Prior to Visit  Medication Sig Dispense Refill   albuterol (VENTOLIN HFA) 108 (90 Base) MCG/ACT inhaler Inhale 2 puffs into the lungs every 6 (six) hours as needed for wheezing or shortness of breath. 18 g 11   atorvastatin (LIPITOR) 40 MG tablet Take 1 tablet by mouth once daily 90 tablet 0   cloNIDine (CATAPRES) 0.1 MG tablet Take 1 tablet by mouth twice daily 180 tablet 0   cyanocobalamin 2000 MCG tablet Take 2,000 mcg by mouth daily.     dapagliflozin propanediol (FARXIGA) 10 MG TABS tablet Take 10 mg by mouth daily. (Patient not taking: Reported on 07/05/2022)     ferrous sulfate 325 (65 FE) MG tablet Take 325 mg by mouth daily with breakfast.     Fluticasone-Umeclidin-Vilant (TRELEGY ELLIPTA) 200-62.5-25 MCG/ACT AEPB Inhale 1 puff into the lungs daily. 180 each 3   furosemide (LASIX) 20 MG tablet Take 1 tablet (20 mg total) by mouth daily. 30 tablet 0   gabapentin (NEURONTIN) 300 MG capsule Take 1 capsule by mouth twice daily 180 capsule 0   hydrALAZINE (APRESOLINE) 25 MG tablet Take 25 mg by mouth 3 (three) times daily. (Patient not taking: Reported on 07/05/2022)     hydroxychloroquine (PLAQUENIL) 200 MG tablet Take 1 tablet (200 mg total) by mouth 2 (two) times daily. 60  tablet 2   isosorbide dinitrate (ISORDIL) 10 MG tablet Take 10 mg by mouth 3 (three) times daily. (Patient not taking: Reported on 07/05/2022)     leflunomide (ARAVA) 20 MG tablet Take 20 mg by mouth daily. (Patient not taking: Reported on 07/25/2022)     magic mouthwash (lidocaine, diphenhydrAMINE, alum & mag hydroxide) suspension Swish and swallow 5 mLs 4 (four) times daily as needed for mouth pain. 360 mL 1   montelukast (SINGULAIR) 10 MG tablet Take 10 mg by mouth at bedtime.     omeprazole (PRILOSEC) 40 MG capsule Take 1 capsule (40 mg total) by mouth 2 (two) times daily. 180 capsule 1   ONETOUCH ULTRA test strip USE TO CHECK BLOOD SUGAR ONCE DAILY IN THE MORNING     PARoxetine (PAXIL-CR) 25 MG 24 hr tablet Take 1 tablet by mouth once daily 90 tablet 0   QUEtiapine (SEROQUEL) 25 MG tablet Take 25 mg by mouth at bedtime. (Patient not taking: Reported on 07/05/2022)     rOPINIRole (REQUIP) 1 MG tablet TAKE 1 TABLET EVERY MORNINGAND 2 TABLETS AT BEDTIME 270 tablet 0   Spacer/Aero-Holding Chambers DEVI Use with inhaler 1 each 0   Tofacitinib Citrate (XELJANZ) 5 MG TABS Take 5 mg by mouth 2 (two) times daily. Take twice a day.     Vitamin D, Ergocalciferol, (DRISDOL) 1.25 MG (50000 UNIT) CAPS capsule Take 1 capsule by mouth once a week 12 capsule 0  No current facility-administered medications on file prior to visit.   Past Medical History:  Diagnosis Date   Acute renal insufficiency    Asthma    Chronic bronchitis    COPD (chronic obstructive pulmonary disease) (HCC)    Depression    History of migraine headaches    Hyperlipemia    OSA (obstructive sleep apnea)    pt states she doesn't have it   Osteoarthritis    Pneumonia    Rheumatoid arthritis(714.0)    RLS (restless legs syndrome)    Vitamin D deficiency disease    Past Surgical History:  Procedure Laterality Date   KNEE SURGERY Bilateral    when younger   TOTAL ABDOMINAL HYSTERECTOMY  06/1988    Family History  Problem  Relation Age of Onset   Lung cancer Father    Stroke Mother    Hypertension Mother    Diabetes Mother    Rheum arthritis Sister    Rheum arthritis Sister    Social History   Socioeconomic History   Marital status: Married    Spouse name: Durene Fruits   Number of children: 3   Years of education: Not on file   Highest education level: Not on file  Occupational History   Occupation: retired    Comment: homemaker  Tobacco Use   Smoking status: Never    Passive exposure: Yes   Smokeless tobacco: Never  Vaping Use   Vaping Use: Never used  Substance and Sexual Activity   Alcohol use: No   Drug use: No   Sexual activity: Not Currently    Birth control/protection: None    Comment: Married  Other Topics Concern   Not on file  Social History Narrative   Lives with her husband, who is very helpful and supportive.   Social Determinants of Health   Financial Resource Strain: Low Risk  (07/25/2022)   Overall Financial Resource Strain (CARDIA)    Difficulty of Paying Living Expenses: Not hard at all  Food Insecurity: No Food Insecurity (05/23/2022)   Hunger Vital Sign    Worried About Running Out of Food in the Last Year: Never true    Ran Out of Food in the Last Year: Never true  Transportation Needs: No Transportation Needs (05/23/2022)   PRAPARE - Hydrologist (Medical): No    Lack of Transportation (Non-Medical): No  Physical Activity: Inactive (07/25/2022)   Exercise Vital Sign    Days of Exercise per Week: 0 days    Minutes of Exercise per Session: 0 min  Stress: No Stress Concern Present (05/23/2022)   [place]    Feeling of Stress : Only a little  Social Connections: Moderately Isolated (05/23/2022)   Social Connection and Isolation Panel [NHANES]    Frequency of Communication with Friends and Family: More than three times a week    Frequency of Social Gatherings with Friends and  Family: More than three times a week    Attends Religious Services: Never    Marine scientist or Organizations: No    Attends Archivist Meetings: Never    Marital Status: Married    Review of Systems  Constitutional:  Positive for chills and fatigue. Negative for fever.  HENT:  Negative for congestion, rhinorrhea and sore throat.   Respiratory:  Negative for cough and shortness of breath.   Cardiovascular:  Negative for chest pain.  Gastrointestinal:  Negative for abdominal pain,  constipation, diarrhea, nausea and vomiting.  Genitourinary:  Positive for dysuria. Negative for urgency.  Musculoskeletal:  Negative for back pain and myalgias.  Neurological:  Negative for dizziness, weakness, light-headedness and headaches.  Psychiatric/Behavioral:  Negative for dysphoric mood. The patient is not nervous/anxious.      Objective:  BP 132/68   Pulse 72   Temp (!) 97.1 F (36.2 C)   Resp 18   Ht 5' (1.524 m)   Wt 196 lb (88.9 kg)   BMI 38.28 kg/m      07/05/2022   11:06 AM 05/23/2022    1:02 PM 05/07/2022    9:16 AM  BP/Weight  Systolic BP 831 517 616  Diastolic BP 68 65 58  Wt. (Lbs) 196 200 200  BMI 38.28 kg/m2 37.79 kg/m2 39.06 kg/m2    Physical Exam Vitals reviewed.  Constitutional:      Appearance: Normal appearance. She is normal weight.  Neck:     Vascular: No carotid bruit.  Cardiovascular:     Rate and Rhythm: Normal rate and regular rhythm.     Heart sounds: Normal heart sounds.  Pulmonary:     Effort: Pulmonary effort is normal. No respiratory distress.     Breath sounds: Normal breath sounds.  Abdominal:     General: Abdomen is flat. Bowel sounds are normal.     Palpations: Abdomen is soft.     Tenderness: There is no abdominal tenderness.  Neurological:     Mental Status: She is alert and oriented to person, place, and time.     Gait: Gait abnormal (in wheel chair.).  Psychiatric:        Mood and Affect: Mood normal.        Behavior:  Behavior normal.     Diabetic Foot Exam - Simple   No data filed      Lab Results  Component Value Date   WBC 9.8 07/05/2022   HGB 10.1 (L) 07/05/2022   HCT 31.5 (L) 07/05/2022   PLT 192 07/05/2022   GLUCOSE 87 07/05/2022   CHOL 192 05/07/2022   TRIG 112 05/07/2022   HDL 84 05/07/2022   LDLCALC 89 05/07/2022   ALT 24 07/05/2022   AST 21 07/05/2022   NA 144 07/05/2022   K 4.6 07/05/2022   CL 106 07/05/2022   CREATININE 1.34 (H) 07/05/2022   BUN 26 07/05/2022   CO2 21 07/05/2022   TSH 2.080 03/30/2021   HGBA1C 6.4 (H) 05/07/2022      Assessment & Plan:   Problem List Items Addressed This Visit       Cardiovascular and Mediastinum   Hypertension associated with diabetes ([place])    Continue singulair.  Hold isosorbide, quetiapine, farxiga, and hydralazine.  Check bp daily.         Genitourinary   UTI (urinary tract infection) - Primary    Rocephin 1 gm x 1.  Start on macrobid 100 mg twice daily x 1 week and then change to one daily for prophylaxis.      Relevant Orders   POCT urinalysis dipstick (Completed)   Urine Culture (Completed)     Hematopoietic and Hemostatic   Thrombocytopenia (HCC)    Check cbc.        Other   Class 2 severe obesity due to excess calories with serious comorbidity and body mass index (BMI) of 38.0 to 38.9 in adult [facility])    comorbidites include diabetes and hypertension      Relevant Orders  CBC with Differential/Platelet (Completed)   Comprehensive metabolic panel (Completed)   VITAMIN D 25 Hydroxy (Vit-D Deficiency, Fractures) (Completed)   Anemia in other chronic diseases classified elsewhere   Relevant Orders   CBC with Differential/Platelet (Completed)   VITAMIN D 25 Hydroxy (Vit-D Deficiency, Fractures) (Completed)   .  Meds ordered this encounter  Medications   DISCONTD: nitrofurantoin, macrocrystal-monohydrate, (MACROBID) 100 MG capsule    Sig: Take 1 capsule (100 mg total) by mouth 2 (two) times daily for 7  days, THEN 1 capsule (100 mg total) 2 (two) times daily for 23 days.    Dispense:  37 capsule    Refill:  0   cefTRIAXone (ROCEPHIN) injection 1 g     Orders Placed This Encounter  Procedures   Urine Culture   CBC with Differential/Platelet   Comprehensive metabolic panel   VITAMIN D 25 Hydroxy (Vit-D Deficiency, Fractures)   POCT urinalysis dipstick      Follow-up: Return in about 4 weeks (around 08/02/2022) for chronic follow up.  An After Visit Summary was printed and given to the patient.  [name], MD [facility] [phone]

## 2022-07-12 NOTE — Assessment & Plan Note (Addendum)
Rocephin 1 gm x 1.  Start on macrobid 100 mg twice daily x 1 week and then change to one daily for prophylaxis.

## 2022-07-12 NOTE — Assessment & Plan Note (Signed)
Continue singulair.  Hold isosorbide, quetiapine, farxiga, and hydralazine.  Check bp daily.

## 2022-07-12 NOTE — Assessment & Plan Note (Signed)
Check cbc 

## 2022-07-12 NOTE — Assessment & Plan Note (Signed)
comorbidites include diabetes and hypertension

## 2022-07-25 ENCOUNTER — Ambulatory Visit (INDEPENDENT_AMBULATORY_CARE_PROVIDER_SITE_OTHER): Payer: Medicare Other

## 2022-07-25 DIAGNOSIS — R300 Dysuria: Secondary | ICD-10-CM

## 2022-07-25 DIAGNOSIS — E1159 Type 2 diabetes mellitus with other circulatory complications: Secondary | ICD-10-CM

## 2022-07-25 DIAGNOSIS — M0579 Rheumatoid arthritis with rheumatoid factor of multiple sites without organ or systems involvement: Secondary | ICD-10-CM

## 2022-07-25 NOTE — Progress Notes (Signed)
Chronic Care Management Pharmacy Note  07/25/2022 Name:  Sherry Burke MRN:  [id number] DOB:  Oct 12, 1941   Summary:  Pleasant 81 year old woman presents for f/u visit. She is wheelchair bound and in pain from her RA, but she enjoys her time watching movies, specifically ones about families moving from the city and finding joy in the country (Like "Lassie"). She was born in [place], then moved to [place] at 67. 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 They lived in [place] for 17 years while husband was in the service (Anniversary October 4th). They have 3 children, 1 daughter 52, and twin boys of 26. She loves eating Little Debbie cakes, especially the strawberry ones.   Plan Recommendations:  Last DEXA scan appears to be 2019. Recommend repeat DEXA scan. CPP tried to schedule with team but unable to in Jan Patient due for eye exam (Hydroxychloroquine). Recommended patient schedule Patient on prophylaxis Nitrofurantoin for UTI. Nitrofurantoin is contraindicated in patients CrCl<60 and patients with pulmonary issues. Recommend alternative agent Patient's medslist states she's on Dapagliflozin, Hydralazine, ISDN, and Quetiapine. Patient is adamant she is no longer taking these. Recommend Dc'ing medications if satisfactory with PCP Patient's BP systolic is 867 every morning. Recommend additive therapy  Subjective: Sherry Burke is an 81 y.o. year old female who is a primary patient of Cox, Kirsten, MD.  The CCM team was consulted for assistance with disease management and care coordination needs.    Engaged with patient by telephone for follow up visit in response to provider referral for pharmacy case management and/or care coordination services.   Consent to Services:  The patient was given information about Chronic Care Management services, agreed to services, and gave verbal consent prior to initiation of services.  Please see initial visit note for detailed  documentation.   Patient Care Team: [name], MD as PCP - General (Family Medicine) Hennie Duos, MD as Consulting Physician (Rheumatology) Lane Hacker, [facility] (Pharmacist)  Recent office visits:  03/29/22 Jerrell Belfast NP. Seen for Acute Cystitis. Started on Ciprofloxacin HCI 542m.    03/06/22 Cox, Kirsten MD. Seen for SOB. Started on Furosemide 276mdaily and Prednisone 2048m   Recent consult visits:  03/14/22 (Pulmonology) ManMarshell Garfinkel. Seen for Interstitial Lung Disease. Started on Trelegy Ellipta 200-62.5-25mcg daily.    Hospital visits:  None  Objective:  Lab Results  Component Value Date   CREATININE 1.34 (H) 07/05/2022   BUN 26 07/05/2022   GFR 37.52 (L) 01/08/2022   GFRNONAA 40 (L) 01/01/2021   GFRAA 46 (L) 01/01/2021   NA 144 07/05/2022   K 4.6 07/05/2022   CALCIUM 9.3 07/05/2022   CO2 21 07/05/2022    Lab Results  Component Value Date/Time   HGBA1C 6.4 (H) 05/07/2022 10:17 AM   HGBA1C 6.3 (H) 10/30/2021 11:05 AM   GFR 37.52 (L) 01/08/2022 09:46 AM    Last diabetic Eye exam:  Lab Results  Component Value Date/Time   HMDIABEYEEXA No Retinopathy 05/21/2022 12:00 AM    Last diabetic Foot exam: No results found for: "HMDIABFOOTEX"   Lab Results  Component Value Date   CHOL 192 05/07/2022   HDL 84 05/07/2022   LDLCALC 89 05/07/2022   TRIG 112 05/07/2022   CHOLHDL 2.3 05/07/2022       Latest Ref Rng & Units 07/05/2022   12:17 PM 05/07/2022   10:17 AM 03/08/2022    9:31 AM  Hepatic Function  Total Protein 6.0 - 8.5 g/dL 6.5  6.7  6.8   Albumin 3.8 - 4.8 g/dL 4.3  4.1  3.9   AST 0 - 40 IU/L _0 ALT 0 - 32 IU/L _1 Alk Phosphatase 44 - 121 IU/L 70  61  67   Total Bilirubin 0.0 - 1.2 mg/dL 0.3  0.3  0.2     Lab Results  Component Value Date/Time   TSH 2.080 03/30/2021 11:29 AM       Latest Ref Rng & Units 07/05/2022   12:17 PM 05/07/2022   10:17 AM 03/06/2022   11:27 AM  CBC  WBC 3.4 - 10.8 x10E3/uL 9.8  10.0   13.9   Hemoglobin 11.1 - 15.9 g/dL 10.1  9.9  10.6   Hematocrit 34.0 - 46.6 % 31.5  30.7  33.5   Platelets 150 - 450 x10E3/uL 192  220  241     Lab Results  Component Value Date/Time   VD25OH 35.4 07/05/2022 12:17 PM    Clinical ASCVD: No  The ASCVD Risk score (Arnett DK, et al., 2019) failed to calculate for the following reasons:   The 2019 ASCVD risk score is only valid for ages 68 to 67       07/05/2022   11:11 AM 05/23/2022    1:08 PM 05/07/2022    9:21 AM  Depression screen PHQ 2/9  Decreased Interest 0 0 1  Down, Depressed, Hopeless 0 0 1  PHQ - 2 Score 0 0 2  Altered sleeping   0  Tired, decreased energy   1  Change in appetite   0  Feeling bad or failure about yourself    0  Trouble concentrating   0  Moving slowly or fidgety/restless   0  Suicidal thoughts   0  PHQ-9 Score   3  Difficult doing work/chores   Not difficult at all     Social History   Tobacco Use  Smoking Status Never   Passive exposure: Yes  Smokeless Tobacco Never   BP Readings from Last 3 Encounters:  07/05/22 132/68  05/23/22 (!) 145/65  05/07/22 (!) 124/58   Pulse Readings from Last 3 Encounters:  07/05/22 72  05/07/22 94  03/14/22 86   Wt Readings from Last 3 Encounters:  07/05/22 196 lb (88.9 kg)  05/23/22 200 lb (90.7 kg)  05/07/22 200 lb (90.7 kg)    Assessment/Interventions: Review of patient past medical history, allergies, medications, health status, including review of consultants reports, laboratory and other test data, was performed as part of comprehensive evaluation and provision of chronic care management services.   SDOH:  (Social Determinants of Health) assessments and interventions performed: Yes SDOH Interventions    Flowsheet Row Chronic Care Management from 07/25/2022 in [place] Office Visit from 05/07/2022 in [place] Management from 02/07/2022 in [place] Management from 12/04/2021 in [place]  SDOH Interventions      Depression Interventions/Treatment  -- PHQ2-9 Score <4 Follow-up Not Indicated -- --  Financial Strain Interventions Intervention Not Indicated -- Intervention Not Indicated Intervention Not Indicated  Physical Activity Interventions Other (Comments) -- -- Other (Comments)  [In a wheelchair and patient is out of breath with minimal movement]       CCM Care Plan  Allergies  Allergen Reactions   Sulfa Antibiotics Nausea And Vomiting   Actemra [Tocilizumab]  Amlodipine    Infliximab Nausea And Vomiting and Other (See Comments)    Affects nervous system Other reaction(s): vomiting   Leflunomide     Other reaction(s): thrombocytopenia   Methotrexate Derivatives Other (See Comments)    sweating   Niaspan [Niacin]     Severe flushing   Sulfasalazine     Other reaction(s): stomach   Upadacitinib     Other reaction(s): elevated lfts   Welchol [Colesevelam] Other (See Comments)    Abdominal pain and headache    Medications Reviewed Today     Reviewed by Lane Hacker, [facility] (Pharmacist) on 07/25/22 at 1117  Med List Status: <None>   Medication Order Taking? Sig Documenting Provider Last Dose Status Informant  albuterol (VENTOLIN HFA) 108 (90 Base) MCG/ACT inhaler [id number]  Inhale 2 puffs into the lungs every 6 (six) hours as needed for wheezing or shortness of breath. Julian Hy, DO  Active   atorvastatin (LIPITOR) 40 MG tablet [id number] Yes Take 1 tablet by mouth once daily Cox, Kirsten, MD Taking Active   cloNIDine (CATAPRES) 0.1 MG tablet [id number] Yes Take 1 tablet by mouth twice daily Cox, Kirsten, MD Taking Active   cyanocobalamin 2000 MCG tablet [id number]  Take 2,000 mcg by mouth daily. [provider]  Active   dapagliflozin propanediol (FARXIGA) 10 MG TABS tablet [id number]  Take 10 mg by mouth daily.  Patient not taking: Reported on 07/05/2022   [provider]  Active   ferrous sulfate 325 (65 FE) MG tablet  [id number]  Take 325 mg by mouth daily with breakfast. [provider]  Active   Fluticasone-Umeclidin-Vilant (TRELEGY ELLIPTA) 200-62.5-25 MCG/ACT AEPB [id number]  Inhale 1 puff into the lungs daily. Marshell Garfinkel, MD  Active   furosemide (LASIX) 20 MG tablet [id number]  Take 1 tablet (20 mg total) by mouth daily. Cox, Kirsten, MD  Active   gabapentin (NEURONTIN) 300 MG capsule [id number] Yes Take 1 capsule by mouth twice daily Cox, Kirsten, MD Taking Active   hydrALAZINE (APRESOLINE) 25 MG tablet [id number] No Take 25 mg by mouth 3 (three) times daily.  Patient not taking: Reported on 07/05/2022   [provider] Not Taking Active   HYDROcodone-acetaminophen (NORCO/VICODIN) 5-325 MG tablet [id number] Yes Take 1 tablet by mouth every 6 (six) hours as needed for moderate pain. Cox, Kirsten, MD Taking Active   hydroxychloroquine (PLAQUENIL) 200 MG tablet [id number] Yes Take 1 tablet (200 mg total) by mouth 2 (two) times daily. Cox, Kirsten, MD Taking Active   isosorbide dinitrate (ISORDIL) 10 MG tablet [id number]  Take 10 mg by mouth 3 (three) times daily.  Patient not taking: Reported on 07/05/2022   [provider]  Active   leflunomide (ARAVA) 20 MG tablet [id number] No Take 20 mg by mouth daily.  Patient not taking: Reported on 07/25/2022   [provider] Not Taking Consider Medication Status and Discontinue   magic mouthwash (lidocaine, diphenhydrAMINE, alum & mag hydroxide) suspension [id number]  Swish and swallow 5 mLs 4 (four) times daily as needed for mouth pain. Rip Harbour, NP  Active   montelukast (SINGULAIR) 10 MG tablet [id number] Yes Take 10 mg by mouth at bedtime. [provider] Taking Active   nitrofurantoin, macrocrystal-monohydrate, (MACROBID) 100 MG capsule [id number] Yes Take 1 capsule (100 mg total) by mouth 2 (two) times daily for 7 days, THEN 1 capsule (100 mg total) daily for 23 days. [name], MD Taking Active   omeprazole  (PRILOSEC) 40 MG capsule [id number]  Yes Take 1 capsule (40 mg total) by mouth 2 (two) times daily. [name], MD Taking Active   [facility] ULTRA test strip [id number]  USE TO CHECK BLOOD SUGAR ONCE DAILY IN THE MORNING [provider]  Active   PARoxetine (PAXIL-CR) 25 MG 24 hr tablet [id number] Yes Take 1 tablet by mouth once daily Cox, Kirsten, MD Taking Active   QUEtiapine (SEROQUEL) 25 MG tablet [id number]  Take 25 mg by mouth at bedtime.  Patient not taking: Reported on 07/05/2022   [provider]  Active   rOPINIRole (REQUIP) 1 MG tablet [id number] Yes TAKE 1 TABLET EVERY MORNINGAND 2 TABLETS AT BEDTIME [name], MD Taking Active   Spacer/Aero-Holding [facility] [id number]  Use with inhaler Marshell Garfinkel, MD  Active   Tofacitinib Citrate (XELJANZ) 5 MG TABS [id number] Yes Take 5 mg by mouth 2 (two) times daily. Take twice a day. 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[place]) 05/07/2022   Primary generalized (osteo)arthritis 05/07/2022   Primary localized osteoarthrosis of multiple sites 05/07/2022   Spinal stenosis of lumbar region 05/07/2022   Thrombocytopenia ([place]) 05/07/2022   B12 deficiency 05/07/2022   Weakness of both lower extremities 03/18/2022   Rheumatoid arthritis involving multiple sites with positive rheumatoid factor ([place]) 03/18/2022   Edema of lower extremity 03/18/2022   Acute renal failure superimposed on stage 3b chronic kidney disease ([place]) 03/18/2022   Abnormality of gait and mobility 03/18/2022   Hypokalemia 12/17/2021    Stridor 11/01/2021   Chronic respiratory failure with hypoxia ([place]) 11/01/2021   Mild recurrent major depression ([place]) 11/01/2021   Localized osteoarthritis of left knee 10/30/2021   UTI (urinary tract infection) 10/20/2021   Moderate persistent asthma 10/13/2021   Primary osteoarthritis of right knee 10/13/2021   Healthcare maintenance 08/01/2020   ILD (interstitial lung disease) ([place]) 03/11/2020   Acute cystitis without hematuria 03/05/2020   Class 2 severe obesity due to excess calories with serious comorbidity and body mass index (BMI) of 38.0 to 38.9 in adult [facility]) 03/05/2020   Dermatitis 03/05/2020   GERD without esophagitis 02/25/2020   Dyslipidemia associated with type 2 diabetes mellitus ([place]) 02/25/2020   Mixed hyperlipidemia 02/25/2020   Hypertension associated with diabetes ([place]) 02/25/2020   Moderate recurrent major depression ([place]) 02/25/2020   Bilateral carpal tunnel syndrome 06/10/2019   Bilateral hand pain 06/10/2019   Neck pain 08/19/2016   Cervical disc disorder 08/19/2016   Rheumatoid arthritis involving both shoulders with positive rheumatoid factor ([place]) 08/19/2016   OSA (obstructive sleep apnea) 04/22/2012   RLS (restless legs syndrome) 04/22/2012   COPD (chronic obstructive pulmonary disease) ([place]) 03/18/2011   Dyspnea on exertion 03/18/2011    Immunization History  Administered Date(s) Administered   Fluad Quad(high Dose 65+) 08/22/2021   Influenza Whole 08/12/2011   Influenza, High Dose Seasonal PF 08/12/2019, 08/01/2020   Moderna Covid-19 Vaccine Bivalent Booster 61yr & up 08/22/2021   Moderna SARS-COV2 Booster Vaccination 09/12/2020, 03/20/2021   Moderna Sars-Covid-2 Vaccination 11/26/2019, 12/24/2019   Pneumococcal Conjugate-13 09/08/2014   Pneumococcal  Polysaccharide-23 10/05/2014   Tdap 10/01/2011    Conditions to be addressed/monitored:  Hypertension, Hyperlipidemia, Diabetes, COPD, Depression and Rheumatoid arthritis  Care Plan : [place]  Updates made by Lane Hacker, RPH since 07/25/2022 12:00 AM     Problem: dm, htn, hld   Priority: High  Onset Date: 01/10/2021     Long-Range Goal: Disease Management   Start Date: 01/10/2021  Expected End Date: 01/10/2022  Recent Progress: On track  Priority: High  Note:      Current Barriers:  Unable to optimize arthritis regimen due to side effects.    Pharmacist Clinical Goal(s):  Over the next 90 days, patient will adhere to prescribed medication regimen as evidenced by fill history through collaboration with PharmD and provider.   Interventions: 1:1 collaboration with Cox, Elnita Maxwell, MD regarding development and update of comprehensive plan of care as evidenced by provider attestation and co-signature Inter-disciplinary care team collaboration (see longitudinal plan of care) Comprehensive medication review performed; medication list updated in electronic medical record  Hypertension (BP goal <130/80) BP Readings from Last 3 Encounters:  07/05/22 132/68  05/23/22 (!) 145/65  05/07/22 (!) 124/58  -Not Controlled -Current treatment: Clonidine 0.1 mg bid Appropriate, Query effective, Safe, Accessible -Medications previously tried: amlodipine, furosemide, spironolactone, Lisinopril (Dc'd in Feb 2023 due to coughing) -Current home readings:  Jan 2023:  Patient states commonly in 286'N systolic March 8177:  116-579 systolic per patient Sept 2023:  Wakes up at 0500, drinks 3 cups of coffee 038 systolic (Tests at 3338) -Current dietary habits: wathcing salt in diet  -Current exercise habits: minimal due to back and knee pain  -Denies hypotensive/hypertensive symptoms -Educated on BP goals and benefits of medications for prevention of heart attack, stroke and kidney damage; Daily salt intake goal < 2300 mg; Exercise goal of 150 minutes per week; Importance of home blood pressure monitoring; Proper BP monitoring technique; -Counseled to monitor BP at  home weekly, document, and provide log at future appointments -Counseled on diet and exercise extensively Sept 2023: Patient not taking Furosemide or Hydralazine. BP is very elevated. Will let PCP know and have CCM team call weekly for BP chck  Hyperlipidemia: (LDL goal < 100) The ASCVD Risk score (Arnett DK, et al., 2019) failed to calculate for the following reasons:   The 2019 ASCVD risk score is only valid for ages 35 to 30 Lab Results  Component Value Date   CHOL 192 05/07/2022   CHOL 184 10/30/2021   CHOL 169 03/30/2021   Lab Results  Component Value Date   HDL 84 05/07/2022   HDL 77 10/30/2021   HDL 77 03/30/2021   Lab Results  Component Value Date   LDLCALC 89 05/07/2022   LDLCALC 89 10/30/2021   LDLCALC 76 03/30/2021   Lab Results  Component Value Date   TRIG 112 05/07/2022   TRIG 101 10/30/2021   TRIG 86 03/30/2021   Lab Results  Component Value Date   CHOLHDL 2.3 05/07/2022   CHOLHDL 2.4 10/30/2021   CHOLHDL 2.2 03/30/2021  No results found for: "LDLDIRECT" -Not ideally controlled -Current treatment: atorvastatin 40 mg daily Appropriate, Effective, Safe, Accessible -Medications previously tried: none reported  -Current dietary patterns: vegetables, wheat bread, homemade soup -Current exercise habits: minimal due to knee/back pain -Educated on Cholesterol goals;  Benefits of statin for ASCVD risk reduction; Importance of limiting foods high in cholesterol; -Counseled on diet and exercise extensively Recommended to continue current medication  Diabetes (A1c goal <  7%) Lab Results  Component Value Date   HGBA1C 6.4 (H) 05/07/2022   HGBA1C 6.3 (H) 10/30/2021   HGBA1C 6.0 (H) 03/30/2021   Lab Results  Component Value Date   LDLCALC 89 05/07/2022   CREATININE 1.34 (H) 07/05/2022   Lab Results  Component Value Date   NA 144 07/05/2022   K 4.6 07/05/2022   CREATININE 1.34 (H) 07/05/2022   EGFR 40 (L) 07/05/2022   GFRNONAA 40 (L) 01/01/2021    GLUCOSE 87 07/05/2022   Lab Results  Component Value Date   WBC 9.8 07/05/2022   HGB 10.1 (L) 07/05/2022   HCT 31.5 (L) 07/05/2022   MCV 87 07/05/2022   PLT 192 07/05/2022  -Controlled -Current medications: Farxiga (Not taking) Query Appropriate,  -Medications previously tried: n/a  -Denies hypoglycemic/hyperglycemic symptoms -Current meal patterns:  breakfast: eggs and toast  lunch: vegetables  dinner: sandwich -Current exercise: minimal due to knee/back pain  -Educated onA1c and blood sugar goals; Complications of diabetes including kidney damage, retinal damage, and cardiovascular disease; Carbohydrate counting and/or plate method -Counseled to check feet daily and get yearly eye exams -Counseled on diet and exercise extensively Sept 2023: Will let PCP know she isn't taking medication   Depression/Anxiety (Goal: manage symptoms of depression) -Controlled -Current treatment:  Paroxetine CR 25 mg daily Appropriate, Effective, Safe, Accessible Quetiapine (Not taking per patient but on medslist) Query Appropriate,  -Medications previously tried/failed: none reported -PHQ9:     07/05/2022   11:11 AM 05/23/2022    1:08 PM 05/07/2022    9:21 AM  Depression screen PHQ 2/9  Decreased Interest 0 0 1  Down, Depressed, Hopeless 0 0 1  PHQ - 2 Score 0 0 2  Altered sleeping   0  Tired, decreased energy   1  Change in appetite   0  Feeling bad or failure about yourself    0  Trouble concentrating   0  Moving slowly or fidgety/restless   0  Suicidal thoughts   0  PHQ-9 Score   3  Difficult doing work/chores   Not difficult at all  -Educated on Benefits of medication for symptom control -Most anti-cholinergic but patient is content on therapy and doesn't wish to change things Sept 2023: Patient also on Quetiapine per medslist but isn't taking. Will let PCP know  Osteoporosis / Osteopenia (Goal reduce risk of fracture ) -Controlled -Last DEXA Scan:  ordered updated scan  12/2020. 2019 scan showed osteopenia -Patient's updated Dexa scan is ordered.  -Current treatment  Vitamin D 50,000 units weekly Appropriate, Effective, Safe, Accessible -Medications previously tried: none reported  -Recommend [phone] units of vitamin D daily. Recommend 1200 mg of calcium daily from dietary and supplemental sources. Recommend weight-bearing and muscle strengthening exercises for building and maintaining bone density. -Counseled on diet and exercise extensively Jan 2023: DEXA ordered for 12/2020 but unable to find results, will ask team if was ordered March 2023: Still haven't heard back, will re-ask  Rheumatoid Arthritis  (Goal: minimize joint pain/swelling) -Controlled -Pain Scale There were no vitals filed for this visit. Aggravating Factors: Chronic Pain  Pain Type: Chronic  Pain with Medications: 3/10 -Current treatment  Hydrocodone-acetaminophen 5-325 mg every 6 hours prn moderate pain Appropriate, Effective, Safe, Accessible Xeljanz 5 mg daily Appropriate, Effective, Safe, Accessible Hydroxychloroquine 200 mg bid Appropriate, Effective, Safe, Accessible Last Eye Exam: May 2022 On med for 4-5 years per patient -Medications previously tried:  methotrexate, leflunomide, nabumetone -Recommended to continue current medication Patient reports that  she is stable on current regimen.  March 2023: Counseled to schedule eye exam for May 2023 Sept 2023: Counseled to get eye exam, still hasn't gotten one per patient. Medslist says she's on Leflunomide but she states she isn't taking. Will let PCP know   Chronic UTI (Goal: Prevent recurrence) -Not ideally controlled -Current treatment  Nitrofurantoin 132m QD Query Appropriate,  -Medications previously tried: N/A  Sept 2023: Nitrofurantoin not recommended long term with kidney dysfunction (Like patient's). Also, Nitrofurantoin contraindicated in patients with trouble breathing, like patient. Will let PCP know    Patient  Goals/Self-Care Activities Over the next 90 days, patient will:  - take medications as prescribed focus on medication adherence by using pill box engage in dietary modifications by reduced sodium.   Follow Up Plan: Telephone follow up appointment with care management team member scheduled for: D2024/01/03 NArizona Constable PFloridaD. - [phone]       Medication Assistance: None required.  Patient affirms current coverage meets needs.  [facility] [place]Phone: [phone]: [phone] [facility] [place] [facility] [address] [place]Phone: [phone]: [phone] Uses pill box? Yes Pt endorses good compliance  We discussed: Current pharmacy is preferred with insurance plan and patient is satisfied with pharmacy services Patient decided to: Continue current medication management strategy  Care Plan and Follow Up Patient Decision:  Patient agrees to Care Plan and Follow-up.  Plan: Telephone follow up appointment with care management team member scheduled for:  12/23  NArizona Constable Pharm.D. -- [phone]

## 2022-07-25 NOTE — Patient Instructions (Signed)
Visit Information   Goals Addressed   None    Patient Care Plan: CCM Pharmacy Care Plan     Problem Identified: dm, htn, hld   Priority: High  Onset Date: 01/10/2021     Long-Range Goal: Disease Management   Start Date: 01/10/2021  Expected End Date: 01/10/2022  Recent Progress: On track  Priority: High  Note:      Current Barriers:  Unable to optimize arthritis regimen due to side effects.    Pharmacist Clinical Goal(s):  Over the next 90 days, patient will adhere to prescribed medication regimen as evidenced by fill history through collaboration with PharmD and provider.   Interventions: 1:1 collaboration with [facility], Elnita Maxwell, MD regarding development and update of comprehensive plan of care as evidenced by provider attestation and co-signature Inter-disciplinary care team collaboration (see longitudinal plan of care) Comprehensive medication review performed; medication list updated in electronic medical record  Hypertension (BP goal <130/80) BP Readings from Last 3 Encounters:  07/05/22 132/68  05/23/22 (!) 145/65  05/07/22 (!) 124/58  -Not Controlled -Current treatment: Clonidine 0.1 mg bid Appropriate, Query effective, Safe, Accessible -Medications previously tried: amlodipine, furosemide, spironolactone, Lisinopril (Dc'd in Feb 2023 due to coughing) -Current home readings:  Jan 2023:  Patient states commonly in 121'F systolic March 7588:  325-498 systolic per patient Sept 2023:  Wakes up at 0500, drinks 3 cups of coffee 264 systolic (Tests at 1583) -Current dietary habits: wathcing salt in diet  -Current exercise habits: minimal due to back and knee pain  -Denies hypotensive/hypertensive symptoms -Educated on BP goals and benefits of medications for prevention of heart attack, stroke and kidney damage; Daily salt intake goal < 2300 mg; Exercise goal of 150 minutes per week; Importance of home blood pressure monitoring; Proper BP monitoring  technique; -Counseled to monitor BP at home weekly, document, and provide log at future appointments -Counseled on diet and exercise extensively Sept 2023: Patient not taking Furosemide or Hydralazine. BP is very elevated. Will let PCP know and have CCM team call weekly for BP chck  Hyperlipidemia: (LDL goal < 100) The ASCVD Risk score (Arnett DK, et al., 2019) failed to calculate for the following reasons:   The 2019 ASCVD risk score is only valid for ages 64 to 25 Lab Results  Component Value Date   CHOL 192 05/07/2022   CHOL 184 10/30/2021   CHOL 169 03/30/2021   Lab Results  Component Value Date   HDL 84 05/07/2022   HDL 77 10/30/2021   HDL 77 03/30/2021   Lab Results  Component Value Date   LDLCALC 89 05/07/2022   LDLCALC 89 10/30/2021   LDLCALC 76 03/30/2021   Lab Results  Component Value Date   TRIG 112 05/07/2022   TRIG 101 10/30/2021   TRIG 86 03/30/2021   Lab Results  Component Value Date   CHOLHDL 2.3 05/07/2022   CHOLHDL 2.4 10/30/2021   CHOLHDL 2.2 03/30/2021  No results found for: "LDLDIRECT" -Not ideally controlled -Current treatment: atorvastatin 40 mg daily Appropriate, Effective, Safe, Accessible -Medications previously tried: none reported  -Current dietary patterns: vegetables, wheat bread, homemade soup -Current exercise habits: minimal due to knee/back pain -Educated on Cholesterol goals;  Benefits of statin for ASCVD risk reduction; Importance of limiting foods high in cholesterol; -Counseled on diet and exercise extensively Recommended to continue current medication  Diabetes (A1c goal <7%) Lab Results  Component Value Date   HGBA1C 6.4 (H) 05/07/2022   HGBA1C 6.3 (H) 10/30/2021   HGBA1C 6.0 (  H) 03/30/2021   Lab Results  Component Value Date   LDLCALC 89 05/07/2022   CREATININE 1.34 (H) 07/05/2022   Lab Results  Component Value Date   NA 144 07/05/2022   K 4.6 07/05/2022   CREATININE 1.34 (H) 07/05/2022   EGFR 40 (L)  07/05/2022   GFRNONAA 40 (L) 01/01/2021   GLUCOSE 87 07/05/2022   Lab Results  Component Value Date   WBC 9.8 07/05/2022   HGB 10.1 (L) 07/05/2022   HCT 31.5 (L) 07/05/2022   MCV 87 07/05/2022   PLT 192 07/05/2022  -Controlled -Current medications: Farxiga (Not taking) Query Appropriate,  -Medications previously tried: n/a  -Denies hypoglycemic/hyperglycemic symptoms -Current meal patterns:  breakfast: eggs and toast  lunch: vegetables  dinner: sandwich -Current exercise: minimal due to knee/back pain  -Educated onA1c and blood sugar goals; Complications of diabetes including kidney damage, retinal damage, and cardiovascular disease; Carbohydrate counting and/or plate method -Counseled to check feet daily and get yearly eye exams -Counseled on diet and exercise extensively Sept 2023: Will let PCP know she isn't taking medication   Depression/Anxiety (Goal: manage symptoms of depression) -Controlled -Current treatment:  Paroxetine CR 25 mg daily Appropriate, Effective, Safe, Accessible Quetiapine (Not taking per patient but on medslist) Query Appropriate,  -Medications previously tried/failed: none reported -PHQ9:     07/05/2022   11:11 AM 05/23/2022    1:08 PM 05/07/2022    9:21 AM  Depression screen PHQ 2/9  Decreased Interest 0 0 1  Down, Depressed, Hopeless 0 0 1  PHQ - 2 Score 0 0 2  Altered sleeping   0  Tired, decreased energy   1  Change in appetite   0  Feeling bad or failure about yourself    0  Trouble concentrating   0  Moving slowly or fidgety/restless   0  Suicidal thoughts   0  PHQ-9 Score   3  Difficult doing work/chores   Not difficult at all  -Educated on Benefits of medication for symptom control -Most anti-cholinergic but patient is content on therapy and doesn't wish to change things Sept 2023: Patient also on Quetiapine per medslist but isn't taking. Will let PCP know  Osteoporosis / Osteopenia (Goal reduce risk of fracture  ) -Controlled -Last DEXA Scan:  ordered updated scan 12/2020. 2019 scan showed osteopenia -Patient's updated Dexa scan is ordered.  -Current treatment  Vitamin D 50,000 units weekly Appropriate, Effective, Safe, Accessible -Medications previously tried: none reported  -Recommend [phone] units of vitamin D daily. Recommend 1200 mg of calcium daily from dietary and supplemental sources. Recommend weight-bearing and muscle strengthening exercises for building and maintaining bone density. -Counseled on diet and exercise extensively Jan 2023: DEXA ordered for 12/2020 but unable to find results, will ask team if was ordered March 2023: Still haven't heard back, will re-ask  Rheumatoid Arthritis  (Goal: minimize joint pain/swelling) -Controlled -Pain Scale There were no vitals filed for this visit. Aggravating Factors: Chronic Pain  Pain Type: Chronic  Pain with Medications: 3/10 -Current treatment  Hydrocodone-acetaminophen 5-325 mg every 6 hours prn moderate pain Appropriate, Effective, Safe, Accessible Xeljanz 5 mg daily Appropriate, Effective, Safe, Accessible Hydroxychloroquine 200 mg bid Appropriate, Effective, Safe, Accessible Last Eye Exam: May 2022 On med for 4-5 years per patient -Medications previously tried:  methotrexate, leflunomide, nabumetone -Recommended to continue current medication Patient reports that she is stable on current regimen.  March 2023: Counseled to schedule eye exam for May 2023 Sept 2023: Counseled to get eye  exam, still hasn't gotten one per patient. Medslist says she's on Leflunomide but she states she isn't taking. Will let PCP know   Chronic UTI (Goal: Prevent recurrence) -Not ideally controlled -Current treatment  Nitrofurantoin 164m QD Query Appropriate,  -Medications previously tried: N/A  Sept 2023: Nitrofurantoin not recommended long term with kidney dysfunction (Like patient's). Also, Nitrofurantoin contraindicated in patients with trouble  breathing, like patient. Will let PCP know    Patient Goals/Self-Care Activities Over the next 90 days, patient will:  - take medications as prescribed focus on medication adherence by using pill box engage in dietary modifications by reduced sodium.   Follow Up Plan: Telephone follow up appointment with care management team member scheduled for: December 2023  NArizona Constable PFloridaD. -- [phone]     Ms. CWesterholdwas given information about Chronic Care Management services today including:  CCM service includes personalized support from designated clinical staff supervised by her physician, including individualized plan of care and coordination with other care providers 24/7 contact phone numbers for assistance for urgent and routine care needs. Standard insurance, coinsurance, copays and deductibles apply for chronic care management only during months in which we provide at least 20 minutes of these services. Most insurances cover these services at 100%, however patients may be responsible for any copay, coinsurance and/or deductible if applicable. This service may help you avoid the need for more expensive face-to-face services. Only one practitioner may furnish and bill the service in a calendar month. The patient may stop CCM services at any time (effective at the end of the month) by phone call to the office staff.  Patient agreed to services and verbal consent obtained.   The patient verbalized understanding of instructions, educational materials, and care plan provided today and DECLINED offer to receive copy of patient instructions, educational materials, and care plan.  The pharmacy team will reach out to the patient again over the next 30 days.   NLane Hacker [place]

## 2022-07-29 ENCOUNTER — Telehealth: Payer: Self-pay

## 2022-07-29 NOTE — Progress Notes (Signed)
Called today to get BP readings. Pt is not taking everyday. I advised to start taking everyday and I will call her next week to get updated readings. Pt understood.  07/25/22- 143/86  Sherry Burke, [place] Clinical Pharmacist Assistant  [phone]

## 2022-07-30 ENCOUNTER — Other Ambulatory Visit: Payer: Self-pay | Admitting: Family Medicine

## 2022-07-30 DIAGNOSIS — M150 Primary generalized (osteo)arthritis: Secondary | ICD-10-CM

## 2022-07-30 DIAGNOSIS — M48062 Spinal stenosis, lumbar region with neurogenic claudication: Secondary | ICD-10-CM

## 2022-07-30 MED ORDER — HYDROCODONE-ACETAMINOPHEN 5-325 MG PO TABS: 1.0000 | ORAL_TABLET | Freq: Four times a day (QID) | ORAL | 0 refills | Status: DC | PRN

## 2022-08-02 ENCOUNTER — Ambulatory Visit: Payer: Medicare Other | Admitting: Family Medicine

## 2022-08-05 ENCOUNTER — Telehealth: Payer: Self-pay

## 2022-08-05 NOTE — Progress Notes (Signed)
08/05/22- Called for weekly BP readings   In the last week she has checked them before and after medication  143/72, 148/80   Elray Mcgregor, [place] Pharmacist Assistant  [phone]

## 2022-08-07 NOTE — Telephone Encounter (Signed)
PCP not worried about BP readings in 140's per last msg. Will check BP one more time in a few weeks.

## 2022-08-08 ENCOUNTER — Other Ambulatory Visit: Payer: Self-pay

## 2022-08-08 DIAGNOSIS — M48062 Spinal stenosis, lumbar region with neurogenic claudication: Secondary | ICD-10-CM

## 2022-08-08 DIAGNOSIS — M150 Primary generalized (osteo)arthritis: Secondary | ICD-10-CM

## 2022-08-08 MED ORDER — HYDROCODONE-ACETAMINOPHEN 5-325 MG PO TABS: 1.0000 | ORAL_TABLET | Freq: Four times a day (QID) | ORAL | 0 refills | Status: DC | PRN

## 2022-08-10 DIAGNOSIS — M059 Rheumatoid arthritis with rheumatoid factor, unspecified: Secondary | ICD-10-CM

## 2022-08-10 DIAGNOSIS — E1159 Type 2 diabetes mellitus with other circulatory complications: Secondary | ICD-10-CM

## 2022-08-10 DIAGNOSIS — I10 Essential (primary) hypertension: Secondary | ICD-10-CM

## 2022-08-13 NOTE — Telephone Encounter (Signed)
I have her scheduled to do a HTN assessment on 10/19 and will send you the chart.  Thank you!  Elray Mcgregor, [place] Pharmacist Assistant  [phone]

## 2022-08-15 ENCOUNTER — Encounter: Payer: Self-pay | Admitting: Nurse Practitioner

## 2022-08-15 ENCOUNTER — Ambulatory Visit (INDEPENDENT_AMBULATORY_CARE_PROVIDER_SITE_OTHER): Payer: Medicare Other | Admitting: Nurse Practitioner

## 2022-08-15 VITALS — BP 136/76 | HR 105 | Temp 97.2°F | Ht 61.0 in | Wt 200.0 lb

## 2022-08-15 DIAGNOSIS — M546 Pain in thoracic spine: Secondary | ICD-10-CM

## 2022-08-15 DIAGNOSIS — R0609 Other forms of dyspnea: Secondary | ICD-10-CM | POA: Diagnosis not present

## 2022-08-15 DIAGNOSIS — J069 Acute upper respiratory infection, unspecified: Secondary | ICD-10-CM

## 2022-08-15 DIAGNOSIS — R079 Chest pain, unspecified: Secondary | ICD-10-CM | POA: Diagnosis not present

## 2022-08-15 DIAGNOSIS — R051 Acute cough: Secondary | ICD-10-CM

## 2022-08-15 DIAGNOSIS — J4541 Moderate persistent asthma with (acute) exacerbation: Secondary | ICD-10-CM

## 2022-08-15 MED ORDER — BENZONATATE 100 MG PO CAPS: 100.0000 mg | ORAL_CAPSULE | Freq: Two times a day (BID) | ORAL | 0 refills | Status: DC | PRN

## 2022-08-15 MED ORDER — AMOXICILLIN-POT CLAVULANATE 875-125 MG PO TABS: 1.0000 | ORAL_TABLET | Freq: Two times a day (BID) | ORAL | 0 refills | Status: DC

## 2022-08-15 NOTE — Progress Notes (Signed)
Acute Office Visit  Subjective:    Patient ID: Sherry Burke, female    DOB: 1941/07/31, 81 y.o.   MRN: [id number]  Chief Complaint  Patient presents with  . URI    HPI: Patient is in today for Upper respiratory symptoms She complains of non productive cough, dyspnea, back pain, and wheezing. Denies fever, chills, or rash.Onset of symptoms was  2 weeks ago and staying constant.She is drinking plenty of fluids, using Trelegy and Albuterol inhalers, taking Robitussin cough syrup.  Past history is significant for asthma, occasional episodes of bronchitis and pneumonia. Patient is non-smoker. States her back hurts when she coughs especially between her shoulder blades.   Past Medical History:  Diagnosis Date  . Acute renal insufficiency   . Asthma   . Chronic bronchitis   . COPD (chronic obstructive pulmonary disease) ([place])   . Depression   . History of migraine headaches   . Hyperlipemia   . OSA (obstructive sleep apnea)    pt states she doesn't have it  . Osteoarthritis   . Pneumonia   . Rheumatoid arthritis(714.0)   . RLS (restless legs syndrome)   . Vitamin D deficiency disease     Past Surgical History:  Procedure Laterality Date  . KNEE SURGERY Bilateral    when younger  . TOTAL ABDOMINAL HYSTERECTOMY  06/1988    Family History  Problem Relation Age of Onset  . Lung cancer Father   . Stroke Mother   . Hypertension Mother   . Diabetes Mother   . Rheum arthritis Sister   . Rheum arthritis Sister     Social History   Socioeconomic History  . Marital status: Married    Spouse name: Durene Fruits  . Number of children: 3  . Years of education: Not on file  . Highest education level: Not on file  Occupational History  . Occupation: retired    Comment: homemaker  Tobacco Use  . Smoking status: Never    Passive exposure: Yes  . Smokeless tobacco: Never  Vaping Use  . Vaping Use: Never used  Substance and Sexual Activity  . Alcohol use: No  . Drug use: No  .  Sexual activity: Not Currently    Birth control/protection: None    Comment: Married  Other Topics Concern  . Not on file  Social History Narrative   Lives with her husband, who is very helpful and supportive.   Social Determinants of Health   Financial Resource Strain: Low Risk  (07/25/2022)   Overall Financial Resource Strain (CARDIA)   . Difficulty of Paying Living Expenses: Not hard at all  Food Insecurity: No Food Insecurity (05/23/2022)   Hunger Vital Sign   . Worried About Charity fundraiser in the Last Year: Never true   . Ran Out of Food in the Last Year: Never true  Transportation Needs: No Transportation Needs (05/23/2022)   PRAPARE - Transportation   . Lack of Transportation (Medical): No   . Lack of Transportation (Non-Medical): No  Physical Activity: Inactive (07/25/2022)   Exercise Vital Sign   . Days of Exercise per Week: 0 days   . Minutes of Exercise per Session: 0 min  Stress: No Stress Concern Present (05/23/2022)   [place]   . Feeling of Stress : Only a little  Social Connections: Moderately Isolated (05/23/2022)   Social Connection and Isolation Panel [NHANES]   . Frequency of Communication with Friends and Family: More  than three times a week   . Frequency of Social Gatherings with Friends and Family: More than three times a week   . Attends Religious Services: Never   . Active Member of Clubs or Organizations: No   . Attends Archivist Meetings: Never   . Marital Status: Married  Human resources officer Violence: Not At Risk (05/23/2022)   Humiliation, Afraid, Rape, and Kick questionnaire   . Fear of Current or Ex-Partner: No   . Emotionally Abused: No   . Physically Abused: No   . Sexually Abused: No    Outpatient Medications Prior to Visit  Medication Sig Dispense Refill  . albuterol (VENTOLIN HFA) 108 (90 Base) MCG/ACT inhaler Inhale 2 puffs into the lungs every 6 (six) hours as  needed for wheezing or shortness of breath. 18 g 11  . atorvastatin (LIPITOR) 40 MG tablet Take 1 tablet by mouth once daily 90 tablet 0  . cloNIDine (CATAPRES) 0.1 MG tablet Take 1 tablet by mouth twice daily 180 tablet 0  . cyanocobalamin 2000 MCG tablet Take 2,000 mcg by mouth daily.    . dapagliflozin propanediol (FARXIGA) 10 MG TABS tablet Take 10 mg by mouth daily. (Patient not taking: Reported on 07/05/2022)    . ferrous sulfate 325 (65 FE) MG tablet Take 325 mg by mouth daily with breakfast.    . Fluticasone-Umeclidin-Vilant (TRELEGY ELLIPTA) 200-62.5-25 MCG/ACT AEPB Inhale 1 puff into the lungs daily. 180 each 3  . furosemide (LASIX) 20 MG tablet Take 1 tablet (20 mg total) by mouth daily. 30 tablet 0  . gabapentin (NEURONTIN) 300 MG capsule Take 1 capsule by mouth twice daily 180 capsule 0  . hydrALAZINE (APRESOLINE) 25 MG tablet Take 25 mg by mouth 3 (three) times daily. (Patient not taking: Reported on 07/05/2022)    . HYDROcodone-acetaminophen (NORCO/VICODIN) 5-325 MG tablet Take 1 tablet by mouth every 6 (six) hours as needed for moderate pain. 120 tablet 0  . hydroxychloroquine (PLAQUENIL) 200 MG tablet Take 1 tablet (200 mg total) by mouth 2 (two) times daily. 60 tablet 2  . isosorbide dinitrate (ISORDIL) 10 MG tablet Take 10 mg by mouth 3 (three) times daily. (Patient not taking: Reported on 07/05/2022)    . leflunomide (ARAVA) 20 MG tablet Take 20 mg by mouth daily. (Patient not taking: Reported on 07/25/2022)    . magic mouthwash (lidocaine, diphenhydrAMINE, alum & mag hydroxide) suspension Swish and swallow 5 mLs 4 (four) times daily as needed for mouth pain. 360 mL 1  . montelukast (SINGULAIR) 10 MG tablet Take 10 mg by mouth at bedtime.    . nitrofurantoin, macrocrystal-monohydrate, (MACROBID) 100 MG capsule Take 1 capsule (100 mg total) by mouth daily. 90 capsule 1  . omeprazole (PRILOSEC) 40 MG capsule Take 1 capsule (40 mg total) by mouth 2 (two) times daily. 180 capsule 1  .  ONETOUCH ULTRA test strip USE TO CHECK BLOOD SUGAR ONCE DAILY IN THE MORNING    . PARoxetine (PAXIL-CR) 25 MG 24 hr tablet Take 1 tablet by mouth once daily 90 tablet 0  . QUEtiapine (SEROQUEL) 25 MG tablet Take 25 mg by mouth at bedtime. (Patient not taking: Reported on 07/05/2022)    . rOPINIRole (REQUIP) 1 MG tablet TAKE 1 TABLET EVERY MORNINGAND 2 TABLETS AT BEDTIME 270 tablet 0  . Spacer/Aero-Holding Dorise Bullion Use with inhaler 1 each 0  . Tofacitinib Citrate (XELJANZ) 5 MG TABS Take 5 mg by mouth 2 (two) times daily. Take twice a day.    Marland Kitchen  Vitamin D, Ergocalciferol, (DRISDOL) 1.25 MG (50000 UNIT) CAPS capsule Take 1 capsule by mouth once a week 12 capsule 0   No facility-administered medications prior to visit.    Allergies  Allergen Reactions  . Sulfa Antibiotics Nausea And Vomiting  . Actemra [Tocilizumab]   . Amlodipine   . Infliximab Nausea And Vomiting and Other (See Comments)    Affects nervous system Other reaction(s): vomiting  . Leflunomide     Other reaction(s): thrombocytopenia  . Methotrexate Derivatives Other (See Comments)    sweating  . Niaspan [Niacin]     Severe flushing  . Sulfasalazine     Other reaction(s): stomach  . Upadacitinib     Other reaction(s): elevated lfts  . Welchol [Colesevelam] Other (See Comments)    Abdominal pain and headache    Review of Systems See pertinent positives and negatives per HPI.     Objective:   BP 136/76   Pulse (!) 105   Temp (!) 97.2 F (36.2 C)   Ht '5\' 1"'  (1.549 m)   Wt 200 lb (90.7 kg)   SpO2 95%   BMI 37.79 kg/m   Physical Exam Vitals reviewed.  Constitutional:      Appearance: She is ill-appearing.     Comments: Pt seated in transport chair  HENT:     Right Ear: Tympanic membrane normal.     Left Ear: Tympanic membrane normal.     Mouth/Throat:     Mouth: Mucous membranes are dry.  Eyes:     Pupils: Pupils are equal, round, and reactive to light.  Cardiovascular:     Rate and Rhythm: Regular  rhythm. Tachycardia present.     Pulses: Normal pulses.     Heart sounds: Normal heart sounds.  Pulmonary:     Breath sounds: Wheezing present.     Comments: Tachypnea noted Abdominal:     General: Bowel sounds are normal.     Palpations: Abdomen is soft.  Musculoskeletal:        General: Tenderness present.     Cervical back: Normal.     Thoracic back: Tenderness present.     Lumbar back: Normal.  Skin:    General: Skin is warm and dry.     Capillary Refill: Capillary refill takes less than 2 seconds.  Neurological:     General: No focal deficit present.     Mental Status: She is alert and oriented to person, place, and time.  Psychiatric:        Mood and Affect: Mood normal.        Behavior: Behavior normal.     Wt Readings from Last 3 Encounters:  07/05/22 196 lb (88.9 kg)  05/23/22 200 lb (90.7 kg)  05/07/22 200 lb (90.7 kg)    Health Maintenance Due  Topic Date Due  . Diabetic kidney evaluation - Urine ACR  01/01/2019  . INFLUENZA VACCINE  06/11/2022       Lab Results  Component Value Date   TSH 2.080 03/30/2021   Lab Results  Component Value Date   WBC 9.8 07/05/2022   HGB 10.1 (L) 07/05/2022   HCT 31.5 (L) 07/05/2022   MCV 87 07/05/2022   PLT 192 07/05/2022   Lab Results  Component Value Date   NA 144 07/05/2022   K 4.6 07/05/2022   CO2 21 07/05/2022   GLUCOSE 87 07/05/2022   BUN 26 07/05/2022   CREATININE 1.34 (H) 07/05/2022   BILITOT 0.3 07/05/2022   ALKPHOS 70 07/05/2022  AST 21 07/05/2022   ALT 24 07/05/2022   PROT 6.5 07/05/2022   ALBUMIN 4.3 07/05/2022   CALCIUM 9.3 07/05/2022   EGFR 40 (L) 07/05/2022   GFR 37.52 (L) 01/08/2022   Lab Results  Component Value Date   CHOL 192 05/07/2022   Lab Results  Component Value Date   HDL 84 05/07/2022   Lab Results  Component Value Date   LDLCALC 89 05/07/2022   Lab Results  Component Value Date   TRIG 112 05/07/2022   Lab Results  Component Value Date   CHOLHDL 2.3 05/07/2022    Lab Results  Component Value Date   HGBA1C 6.4 (H) 05/07/2022       Assessment & Plan:   1. Moderate persistent asthma with acute exacerbation - DG Chest 2 View; Future - amoxicillin-clavulanate (AUGMENTIN) 875-125 MG tablet; Take 1 tablet by mouth 2 (two) times daily.  Dispense: 20 tablet; Refill: 0  2. Dyspnea on exertion - DG Chest 2 View; Future  3. Acute cough - DG Chest 2 View; Future - benzonatate (TESSALON) 100 MG capsule; Take 1 capsule (100 mg total) by mouth 2 (two) times daily as needed for cough.  Dispense: 20 capsule; Refill: 0  4. Viral upper respiratory tract infection - DG Chest 2 View; Future  5. Acute bilateral thoracic back pain - DG Chest 2 View; Future     Obtain chest x-ray at [facility] today Rest and push fluids Take Augmentin twice daily with food for 10 days Take Mucinex twice daily for cough Tessalon Perles for cough as directed Seek emergency medical care for any severe or concerning symptoms Follow-up as needed  Follow-up: PRN, pending cxr results  An After Visit Summary was printed and given to the patient.  I, Rip Harbour, NP, have reviewed all documentation for this visit. The documentation on 08/15/22 for the exam, diagnosis, procedures, and orders are all accurate and complete.   Rip Harbour, NP [place] [phone]

## 2022-08-15 NOTE — Patient Instructions (Addendum)
Obtain chest x-ray at [facility] today Rest and push fluids Take Augmentin twice daily with food for 10 days Take Mucinex twice daily for cough Tessalon Perles for cough as directed Seek emergency medical care for any severe or concerning symptoms Follow-up as needed      Community-Acquired Pneumonia, Adult Pneumonia is an infection of the lungs. It causes irritation and swelling in the airways of the lungs. Mucus and fluid may also build up inside the airways. This may cause coughing and trouble breathing. One type of pneumonia can happen while you are in a hospital. A different type can happen when you are not in a hospital (community-acquired pneumonia). What are the causes?  This condition is caused by germs (viruses, bacteria, or fungi). Some types of germs can spread from person to person. Pneumonia is not thought to spread from person to person. What increases the risk? You have a long-term (chronic) disease, such as: Disease of the lungs. This may be chronic obstructive pulmonary disease (COPD) or asthma. Heart failure. Cystic fibrosis. Diabetes. Kidney disease. Sickle cell disease. HIV. You have other health problems, such as: Your body's defense system (immune system) is weak. A condition that may cause you to breathe in fluids from your mouth and nose. You had your spleen taken out. You do not take good care of your teeth and mouth (poor dental hygiene). You use or have used tobacco products. You go where the germs that cause this illness are common. You are older than 81 years of age. What are the signs or symptoms? A cough. A fever. Sweating or chills. Chest pain, often when you breathe deeply or cough. Breathing problems, such as: Fast breathing. Trouble breathing. Shortness of breath. Feeling tired (fatigued). Muscle aches. How is this treated? Treatment for this condition depends on many things, such as: The cause of your illness. Your  medicines. Your other health problems. Most adults can be treated at home. Sometimes, treatment must happen in a hospital. Treatment may include medicines to kill germs. Medicines may depend on which germ caused your illness. Very bad pneumonia is rare. If you get it, you may: Have a machine to help you breathe. Have fluid taken away from around your lungs. Follow these instructions at home: Medicines Take over-the-counter and prescription medicines only as told by your doctor. Take cough medicine only if you are losing sleep. Cough medicine can keep your body from taking mucus away from your lungs. If you were prescribed antibiotics, take them as told by your doctor. Do not stop taking them even if you start to feel better. Lifestyle     Do not smoke or use any products that contain nicotine or tobacco. If you need help quitting, ask your doctor. Do not drink alcohol. Eat a healthy diet. This includes a lot of vegetables, fruits, whole grains, low-fat dairy products, and low-fat (lean) protein. General instructions  Rest a lot. Sleep for at least 8 hours each night. Sleep with your head and neck raised. Put a few pillows under your head or sleep in a reclining chair. Return to your normal activities as told by your doctor. Ask your doctor what activities are safe for you. Drink enough fluid to keep your pee (urine) pale yellow. If your throat is sore, gargle with a mixture of salt and water 3-4 times a day or as needed. To make salt water, completely dissolve -1 tsp (3-6 g) of salt in 1 cup (237 mL) of warm water. Keep all follow-up  visits. How is this prevented? Getting the pneumonia shot (vaccine). These shots have different types and schedules. Ask your doctor what works best for you. Think about getting this shot if: You are older than 81 years of age. You are 5-81 years of age and: You are being treated for cancer. You have long-term lung disease. You have other problems  that affect your body's defense system. Ask your doctor if you have one of these. Getting your flu shot every year. Ask your doctor which type of shot is best for you. Going to the dentist as often as told. Washing your hands often with soap and water for at least 20 seconds. If you cannot use soap and water, use hand sanitizer. Contact a doctor if: You have a fever. You lose sleep because your cough medicine does not help. Get help right away if: You are short of breath and this gets worse. You have more chest pain. Your sickness gets worse. This is very serious if: You are an older adult. Your body's defense system is weak. You cough up blood. These symptoms may be an emergency. Get help right away. Call 911. Do not wait to see if the symptoms will go away. Do not drive yourself to the hospital. Summary Pneumonia is an infection of the lungs. Community-acquired pneumonia affects people who have not been in the hospital. Certain germs can cause this infection. This condition may be treated with medicines that kill germs. For very bad pneumonia, you may need a hospital stay and treatment to help with breathing. This information is not intended to replace advice given to you by your health care provider. Make sure you discuss any questions you have with your health care provider. Document Revised: 12/26/2021 Document Reviewed: 12/26/2021 Elsevier Patient Education  [employer].

## 2022-08-17 ENCOUNTER — Other Ambulatory Visit: Payer: Self-pay | Admitting: Family Medicine

## 2022-08-20 ENCOUNTER — Other Ambulatory Visit: Payer: Self-pay

## 2022-08-20 DIAGNOSIS — J4541 Moderate persistent asthma with (acute) exacerbation: Secondary | ICD-10-CM

## 2022-08-20 DIAGNOSIS — J069 Acute upper respiratory infection, unspecified: Secondary | ICD-10-CM

## 2022-08-20 DIAGNOSIS — R051 Acute cough: Secondary | ICD-10-CM

## 2022-08-20 DIAGNOSIS — M546 Pain in thoracic spine: Secondary | ICD-10-CM

## 2022-08-20 DIAGNOSIS — R0609 Other forms of dyspnea: Secondary | ICD-10-CM

## 2022-08-28 ENCOUNTER — Ambulatory Visit (INDEPENDENT_AMBULATORY_CARE_PROVIDER_SITE_OTHER): Payer: Medicare Other | Admitting: Family Medicine

## 2022-08-28 VITALS — BP 130/78 | HR 78 | Temp 96.8°F | Resp 18 | Ht 61.0 in | Wt 200.0 lb

## 2022-08-28 DIAGNOSIS — M546 Pain in thoracic spine: Secondary | ICD-10-CM

## 2022-08-28 DIAGNOSIS — E1159 Type 2 diabetes mellitus with other circulatory complications: Secondary | ICD-10-CM

## 2022-08-28 DIAGNOSIS — G2581 Restless legs syndrome: Secondary | ICD-10-CM | POA: Diagnosis not present

## 2022-08-28 DIAGNOSIS — E782 Mixed hyperlipidemia: Secondary | ICD-10-CM

## 2022-08-28 DIAGNOSIS — I152 Hypertension secondary to endocrine disorders: Secondary | ICD-10-CM | POA: Diagnosis not present

## 2022-08-28 DIAGNOSIS — M1712 Unilateral primary osteoarthritis, left knee: Secondary | ICD-10-CM | POA: Diagnosis not present

## 2022-08-28 DIAGNOSIS — M1711 Unilateral primary osteoarthritis, right knee: Secondary | ICD-10-CM | POA: Diagnosis not present

## 2022-08-28 DIAGNOSIS — M0579 Rheumatoid arthritis with rheumatoid factor of multiple sites without organ or systems involvement: Secondary | ICD-10-CM

## 2022-08-28 DIAGNOSIS — K219 Gastro-esophageal reflux disease without esophagitis: Secondary | ICD-10-CM | POA: Diagnosis not present

## 2022-08-28 DIAGNOSIS — J410 Simple chronic bronchitis: Secondary | ICD-10-CM | POA: Diagnosis not present

## 2022-08-28 DIAGNOSIS — N3000 Acute cystitis without hematuria: Secondary | ICD-10-CM

## 2022-08-28 MED ORDER — CIPROFLOXACIN HCL 250 MG PO TABS: 250.0000 mg | ORAL_TABLET | Freq: Two times a day (BID) | ORAL | 0 refills | Status: AC

## 2022-08-28 MED ORDER — TRIAMCINOLONE ACETONIDE 40 MG/ML IJ SUSP
80.0000 mg | Freq: Once | INTRAMUSCULAR | Status: AC
  Administered 2022-08-28: 80 mg via INTRA_ARTICULAR

## 2022-08-28 MED ORDER — FAMOTIDINE 20 MG PO TABS: 20.0000 mg | ORAL_TABLET | Freq: Every day | ORAL | 2 refills | Status: AC

## 2022-08-28 NOTE — Progress Notes (Signed)
Subjective:  Patient ID: Sherry Burke, female    DOB: 1941-02-22  Age: 81 y.o. MRN: [id number]  Chief Complaint  Patient presents with   lethargic   Back Pain   Knee Pain   HPI Back pain x 2 weeks. Up and down and now between shoulder blades.   GERD: terrible indigestion. Omeprazole 40 mg twice daily.   Knee OA BL. Severe pain.  Urine strong. No increase frequency. Avoids lasix due to urinary frequency.   Not drinking enough water. Drinks unsweet tea several times per day.   Diabetes:  Checks sugars every other day. 150-160.  Last a1c 6.4.  No polydipsia, polyphagia, but has polyuria.   Resp: trelegy one puff daily.   RA: plaquenil 200 mg 2 times a day and xeljanz 5 mg twice daily.  RLS: on ropinoerol 1 mg once daily at night. Gabapentin helps with RLS.  Vitamin D: taking vitamin D 50K weekly.  Depression/anxiety: on paxil cr 25 mg daily.    Current Outpatient Medications on File Prior to Visit  Medication Sig Dispense Refill   leflunomide (ARAVA) 20 MG tablet Take 20 mg by mouth daily.     albuterol (VENTOLIN HFA) 108 (90 Base) MCG/ACT inhaler Inhale 2 puffs into the lungs every 6 (six) hours as needed for wheezing or shortness of breath. 18 g 11   atorvastatin (LIPITOR) 40 MG tablet Take 1 tablet by mouth once daily 90 tablet 0   cloNIDine (CATAPRES) 0.1 MG tablet Take 1 tablet by mouth twice daily 180 tablet 0   cyanocobalamin 2000 MCG tablet Take 2,000 mcg by mouth daily.     ferrous sulfate 325 (65 FE) MG tablet Take 325 mg by mouth daily with breakfast.     Fluticasone-Umeclidin-Vilant (TRELEGY ELLIPTA) 200-62.5-25 MCG/ACT AEPB Inhale 1 puff into the lungs daily. 180 each 3   furosemide (LASIX) 20 MG tablet Take 1 tablet (20 mg total) by mouth daily. (Patient not taking: Reported on 08/28/2022) 30 tablet 0   gabapentin (NEURONTIN) 300 MG capsule Take 1 capsule by mouth twice daily 180 capsule 0   HYDROcodone-acetaminophen (NORCO/VICODIN) 5-325 MG tablet Take 1  tablet by mouth every 6 (six) hours as needed for moderate pain. 120 tablet 0   hydroxychloroquine (PLAQUENIL) 200 MG tablet Take 1 tablet (200 mg total) by mouth 2 (two) times daily. 60 tablet 2   isosorbide dinitrate (ISORDIL) 10 MG tablet Take 10 mg by mouth 3 (three) times daily. (Patient not taking: Reported on 07/05/2022)     magic mouthwash (lidocaine, diphenhydrAMINE, alum & mag hydroxide) suspension Swish and swallow 5 mLs 4 (four) times daily as needed for mouth pain. 360 mL 1   montelukast (SINGULAIR) 10 MG tablet Take 10 mg by mouth at bedtime.     nitrofurantoin, macrocrystal-monohydrate, (MACROBID) 100 MG capsule Take 1 capsule (100 mg total) by mouth daily. 90 capsule 1   omeprazole (PRILOSEC) 40 MG capsule Take 1 capsule (40 mg total) by mouth 2 (two) times daily. 180 capsule 1   ONETOUCH ULTRA test strip USE TO CHECK BLOOD SUGAR ONCE DAILY IN THE MORNING     PARoxetine (PAXIL-CR) 25 MG 24 hr tablet Take 1 tablet by mouth once daily 90 tablet 0   rOPINIRole (REQUIP) 1 MG tablet TAKE 1 TABLET EVERY MORNINGAND 2 TABLETS AT BEDTIME 270 tablet 0   Spacer/Aero-Holding Chambers DEVI Use with inhaler 1 each 0   Tofacitinib Citrate (XELJANZ) 5 MG TABS Take 5 mg by mouth 2 (two) times daily.  Take twice a day.     Vitamin D, Ergocalciferol, (DRISDOL) 1.25 MG (50000 UNIT) CAPS capsule Take 1 capsule by mouth once a week 12 capsule 0   No current facility-administered medications on file prior to visit.   Past Medical History:  Diagnosis Date   Acute renal insufficiency    Asthma    Chronic bronchitis    COPD (chronic obstructive pulmonary disease) (HCC)    Depression    History of migraine headaches    Hyperlipemia    OSA (obstructive sleep apnea)    pt states she doesn't have it   Osteoarthritis    Pneumonia    Rheumatoid arthritis(714.0)    RLS (restless legs syndrome)    Vitamin D deficiency disease    Past Surgical History:  Procedure Laterality Date   KNEE SURGERY  Bilateral    when younger   TOTAL ABDOMINAL HYSTERECTOMY  06/1988    Family History  Problem Relation Age of Onset   Lung cancer Father    Stroke Mother    Hypertension Mother    Diabetes Mother    Rheum arthritis Sister    Rheum arthritis Sister    Social History   Socioeconomic History   Marital status: Married    Spouse name: Durene Fruits   Number of children: 3   Years of education: Not on file   Highest education level: Not on file  Occupational History   Occupation: retired    Comment: homemaker  Tobacco Use   Smoking status: Never    Passive exposure: Yes   Smokeless tobacco: Never  Vaping Use   Vaping Use: Never used  Substance and Sexual Activity   Alcohol use: No   Drug use: No   Sexual activity: Not Currently    Birth control/protection: None    Comment: Married  Other Topics Concern   Not on file  Social History Narrative   Lives with her husband, who is very helpful and supportive.   Social Determinants of Health   Financial Resource Strain: Low Risk  (07/25/2022)   Overall Financial Resource Strain (CARDIA)    Difficulty of Paying Living Expenses: Not hard at all  Food Insecurity: No Food Insecurity (05/23/2022)   Hunger Vital Sign    Worried About Running Out of Food in the Last Year: Never true    Ran Out of Food in the Last Year: Never true  Transportation Needs: No Transportation Needs (05/23/2022)   PRAPARE - Hydrologist (Medical): No    Lack of Transportation (Non-Medical): No  Physical Activity: Inactive (07/25/2022)   Exercise Vital Sign    Days of Exercise per Week: 0 days    Minutes of Exercise per Session: 0 min  Stress: No Stress Concern Present (05/23/2022)   [place]    Feeling of Stress : Only a little  Social Connections: Moderately Isolated (05/23/2022)   Social Connection and Isolation Panel [NHANES]    Frequency of Communication with  Friends and Family: More than three times a week    Frequency of Social Gatherings with Friends and Family: More than three times a week    Attends Religious Services: Never    Marine scientist or Organizations: No    Attends Archivist Meetings: Never    Marital Status: Married   Review of Systems  Constitutional:  Positive for fatigue. Negative for chills and fever.  HENT:  Negative for congestion,  rhinorrhea and sore throat.   Respiratory:  Positive for cough. Negative for shortness of breath.   Cardiovascular:  Positive for leg swelling. Negative for chest pain.  Gastrointestinal:  Positive for constipation. Negative for abdominal pain, diarrhea, nausea and vomiting.  Genitourinary:  Negative for dysuria and urgency.       Urine strong smelling   Musculoskeletal:  Positive for arthralgias (bilateral knee pain R>L) and back pain. Negative for myalgias.  Neurological:  Positive for dizziness. Negative for weakness, light-headedness and headaches.  Psychiatric/Behavioral:  Negative for dysphoric mood. The patient is not nervous/anxious.      Objective:  BP 130/78   Pulse 78   Temp (!) 96.8 F (36 C)   Resp 18   Ht '5\' 1"'$  (1.549 m)   Wt 200 lb (90.7 kg)   BMI 37.79 kg/m      08/28/2022    9:43 AM 08/15/2022    1:41 PM 07/05/2022   11:06 AM  BP/Weight  Systolic BP 725 366 440  Diastolic BP 78 76 68  Wt. (Lbs) 200 200 196  BMI 37.79 kg/m2 37.79 kg/m2 38.28 kg/m2    Physical Exam Vitals reviewed.  Constitutional:      Appearance: Normal appearance. She is normal weight.  Neck:     Vascular: No carotid bruit.  Cardiovascular:     Rate and Rhythm: Normal rate and regular rhythm.     Heart sounds: Normal heart sounds.  Pulmonary:     Effort: Pulmonary effort is normal. No respiratory distress.     Breath sounds: Normal breath sounds.  Abdominal:     General: Abdomen is flat. Bowel sounds are normal.     Palpations: Abdomen is soft.     Tenderness:  There is no abdominal tenderness.  Musculoskeletal:        General: Tenderness (BL knees) present.  Neurological:     Mental Status: She is alert and oriented to person, place, and time.  Psychiatric:        Mood and Affect: Mood normal.        Behavior: Behavior normal.     Diabetic Foot Exam - Simple   No data filed      Lab Results  Component Value Date   WBC 14.9 (H) 08/30/2022   HGB 9.9 (L) 08/30/2022   HCT 30.5 (L) 08/30/2022   PLT 270 08/30/2022   GLUCOSE 146 (H) 08/30/2022   CHOL 203 (H) 08/30/2022   TRIG 121 08/30/2022   HDL 92 08/30/2022   LDLCALC 90 08/30/2022   ALT 21 08/30/2022   AST 24 08/30/2022   NA 145 (H) 08/30/2022   K 4.0 08/30/2022   CL 106 08/30/2022   CREATININE 1.37 (H) 08/30/2022   BUN 28 (H) 08/30/2022   CO2 21 08/30/2022   TSH 2.080 03/30/2021   HGBA1C 6.2 (H) 08/30/2022      Assessment & Plan:   Problem List Items Addressed This Visit       Cardiovascular and Mediastinum   Hypertension associated with diabetes ([place]) - Primary    The current medical regimen is effective;  continue present plan and medications. Continue clonidine 0.1 mg twice daily, isordil 10 mg three times a day,       Relevant Orders   CBC with Differential/Platelet (Completed)   Comprehensive metabolic panel (Completed)   Hemoglobin A1c (Completed)     Respiratory   COPD (chronic obstructive pulmonary disease) ([place])    Continue singulair.  Continue trelegy one inhalation daily.  Continue albuterol hfa 2 puffs four times a day as needed.         Digestive   GERD without esophagitis    Heartburn: Add famotidine 20 mg before supper.      Relevant Medications   famotidine (PEPCID) 20 MG tablet     Musculoskeletal and Integument   Primary osteoarthritis of right knee    Risks were discussed including bleeding, infection, increase in sugars if diabetic, atrophy at site of injection, and increased pain.  After consent was obtained, using sterile  technique the right knee was prepped with alcohol.  The joint was entered and Kenalog 80 mg and 5 ml plain Lidocaine was then injected and the needle withdrawn.  The procedure was well tolerated.   The patient is asked to continue to rest the joint for a few more days before resuming regular activities.  It may be more painful for the first 1-2 days.  Watch for fever, or increased swelling or persistent pain in the joint. Call or return to clinic prn if such symptoms occur or there is failure to improve as anticipated.       Rheumatoid arthritis involving multiple sites with positive rheumatoid factor (HCC)    The current medical regimen is effective;  continue present plan and medications. Management per specialist.        Primary osteoarthritis of left knee    Risks were discussed including bleeding, infection, increase in sugars if diabetic, atrophy at site of injection, and increased pain.  After consent was obtained, using sterile technique the Left knee was prepped with alcohol.  The joint was entered and Kenalog 80 mg and 5 ml plain Lidocaine was then injected and the needle withdrawn.  The procedure was well tolerated.   The patient is asked to continue to rest the joint for a few more days before resuming regular activities.  It may be more painful for the first 1-2 days.  Watch for fever, or increased swelling or persistent pain in the joint. Call or return to clinic prn if such symptoms occur or there is failure to improve as anticipated.         Genitourinary   Acute cystitis without hematuria    Urinary tract infection: Sent Cipro 250 mg twice daily for 7 days.  If symptoms do not improve within the first 2 to 3 days, patient should return for repeat urinalysis.       Relevant Medications   ciprofloxacin (CIPRO) 250 MG tablet     Other   RLS (restless legs syndrome)    Continue gabapentin.      Mixed hyperlipidemia    Well controlled.  No changes to medicines. Continue  atorvastatin 10 mg before bed.  Continue to work on eating a healthy diet and exercise.  Labs drawn today.        Relevant Orders   Lipid panel (Completed)   Cardiovascular Risk Assessment (Completed)   Acute right-sided thoracic back pain  .  Meds ordered this encounter  Medications   famotidine (PEPCID) 20 MG tablet    Sig: Take 1 tablet (20 mg total) by mouth daily.    Dispense:  30 tablet    Refill:  2   triamcinolone acetonide (KENALOG-40) injection 80 mg   triamcinolone acetonide (KENALOG-40) injection 80 mg   ciprofloxacin (CIPRO) 250 MG tablet    Sig: Take 1 tablet (250 mg total) by mouth 2 (two) times daily for 7 days.    Dispense:  14  tablet    Refill:  0    Orders Placed This Encounter  Procedures   CBC with Differential/Platelet   Comprehensive metabolic panel   Hemoglobin A1c   Lipid panel   Cardiovascular Risk Assessment     Follow-up: Return in about 3 months (around 11/28/2022) for chronic fasting.  An After Visit Summary was printed and given to the patient.  [name], MD [facility] [phone]

## 2022-08-28 NOTE — Patient Instructions (Addendum)
Urinary tract infection: Sent Cipro 250 mg twice daily for 7 days.  If symptoms do not improve within the first 2 to 3 days, patient should return for repeat urinalysis.  Heartburn: Add famotidine 20 mg before supper.

## 2022-08-29 ENCOUNTER — Telehealth: Payer: Self-pay

## 2022-08-29 NOTE — Progress Notes (Signed)
Chronic Care Management Pharmacy Assistant   Name: Marlynn Hinckley  MRN: [id number] DOB: [birth year]   Reason for Encounter: Disease State call for HTN   Recent office visits:  08/28/22 [name] MD. Seen for routine visit. Started on Ciprofloxacin '250mg'$  and Famotidine '20mg'$ . D/C Farxiga '10mg'$ , Hydralazine '25mg'$ , and Seroquel '25mg'$ .  08/15/22 Jerrell Belfast NP. Seen for URI. Started on Augmentin 875-'125mg'$  and Benzonatate '100mg'$ .   Recent consult visits:  None  Hospital visits:  None  Medications: Outpatient Encounter Medications as of 08/29/2022  Medication Sig   albuterol (VENTOLIN HFA) 108 (90 Base) MCG/ACT inhaler Inhale 2 puffs into the lungs every 6 (six) hours as needed for wheezing or shortness of breath.   atorvastatin (LIPITOR) 40 MG tablet Take 1 tablet by mouth once daily   ciprofloxacin (CIPRO) 250 MG tablet Take 1 tablet (250 mg total) by mouth 2 (two) times daily for 7 days.   cloNIDine (CATAPRES) 0.1 MG tablet Take 1 tablet by mouth twice daily   cyanocobalamin 2000 MCG tablet Take 2,000 mcg by mouth daily.   famotidine (PEPCID) 20 MG tablet Take 1 tablet (20 mg total) by mouth daily.   ferrous sulfate 325 (65 FE) MG tablet Take 325 mg by mouth daily with breakfast.   Fluticasone-Umeclidin-Vilant (TRELEGY ELLIPTA) 200-62.5-25 MCG/ACT AEPB Inhale 1 puff into the lungs daily.   furosemide (LASIX) 20 MG tablet Take 1 tablet (20 mg total) by mouth daily. (Patient not taking: Reported on 08/28/2022)   gabapentin (NEURONTIN) 300 MG capsule Take 1 capsule by mouth twice daily   HYDROcodone-acetaminophen (NORCO/VICODIN) 5-325 MG tablet Take 1 tablet by mouth every 6 (six) hours as needed for moderate pain.   hydroxychloroquine (PLAQUENIL) 200 MG tablet Take 1 tablet (200 mg total) by mouth 2 (two) times daily.   isosorbide dinitrate (ISORDIL) 10 MG tablet Take 10 mg by mouth 3 (three) times daily. (Patient not taking: Reported on 07/05/2022)   leflunomide (ARAVA) 20 MG tablet  Take 20 mg by mouth daily.   magic mouthwash (lidocaine, diphenhydrAMINE, alum & mag hydroxide) suspension Swish and swallow 5 mLs 4 (four) times daily as needed for mouth pain.   montelukast (SINGULAIR) 10 MG tablet Take 10 mg by mouth at bedtime.   nitrofurantoin, macrocrystal-monohydrate, (MACROBID) 100 MG capsule Take 1 capsule (100 mg total) by mouth daily.   omeprazole (PRILOSEC) 40 MG capsule Take 1 capsule (40 mg total) by mouth 2 (two) times daily.   ONETOUCH ULTRA test strip USE TO CHECK BLOOD SUGAR ONCE DAILY IN THE MORNING   PARoxetine (PAXIL-CR) 25 MG 24 hr tablet Take 1 tablet by mouth once daily   rOPINIRole (REQUIP) 1 MG tablet TAKE 1 TABLET EVERY MORNINGAND 2 TABLETS AT BEDTIME   Spacer/Aero-Holding Chambers DEVI Use with inhaler   Tofacitinib Citrate (XELJANZ) 5 MG TABS Take 5 mg by mouth 2 (two) times daily. Take twice a day.   Vitamin D, Ergocalciferol, (DRISDOL) 1.25 MG (50000 UNIT) CAPS capsule Take 1 capsule by mouth once a week   No facility-administered encounter medications on file as of 08/29/2022.     Recent Office Vitals: BP Readings from Last 3 Encounters:  08/28/22 130/78  08/15/22 136/76  07/05/22 132/68   Pulse Readings from Last 3 Encounters:  08/28/22 78  08/15/22 (!) 105  07/05/22 72    Wt Readings from Last 3 Encounters:  08/28/22 200 lb (90.7 kg)  08/15/22 200 lb (90.7 kg)  07/05/22 196 lb (88.9 kg)     Kidney  Function Lab Results  Component Value Date/Time   CREATININE 1.34 (H) 07/05/2022 12:17 PM   CREATININE 1.37 (H) 05/07/2022 10:17 AM   GFR 37.52 (L) 01/08/2022 09:46 AM   GFRNONAA 40 (L) 01/01/2021 10:16 AM   GFRAA 46 (L) 01/01/2021 10:16 AM       Latest Ref Rng & Units 07/05/2022   12:17 PM 05/07/2022   10:17 AM 03/08/2022    9:31 AM  BMP  Glucose 70 - 99 mg/dL 87  108  120   BUN 8 - 27 mg/dL 26  23  41   Creatinine 0.57 - 1.00 mg/dL 1.34  1.37  1.66   BUN/Creat Ratio 12 - '28 19  17  25   '$ Sodium 134 - 144 mmol/L 144  141   144   Potassium 3.5 - 5.2 mmol/L 4.6  4.0  4.8   Chloride 96 - 106 mmol/L 106  103  105   CO2 20 - 29 mmol/L '21  21  23   '$ Calcium 8.7 - 10.3 mg/dL 9.3  9.1  9.4      Current antihypertensive regimen:  Clonidine 0.1 mg bid  Furosemide '20mg'$  daily- (Not taking due to her not being able to reach the bathroom in time since the increased urination so she does not take them. She stated she does have a lot of swelling in her legs and she is having a hard time getting deep breaths in. Pt does have COPD and stated her Trelegy does help. Pt saw Dr. Tobie Poet yesterday and did not say anything about her legs other than keeping the propped up and take her medicine.) Patient verbally confirms she is taking the above medications as directed. Yes  How often are you checking your Blood Pressure? infrequently  she checks her blood pressure in the afternoon after taking her medication.  Current home BP readings: 08/29/22 140/64 (After getting up and walking) Took it again 5 mins after resting 128/59. 08/28/22 139/72  Wrist or arm cuff:Arm  Any readings above 180/120? No  What recent interventions/DTPs have been made by any provider to improve Blood Pressure control since last CPP Visit: No changes  Any recent hospitalizations or ED visits since last visit with CPP? No  What exercise is being done to improve your Blood Pressure Control?  No exercise due to COPD  Adherence Review: Is the patient currently on ACE/ARB medication? No Does the patient have >5 day gap between last estimated fill dates? CPP to review  Care Gaps: Last annual wellness visit?05/23/22  Star Rating Drugs:  Medication:  Last Fill: Day Supply None noted   Elray Mcgregor, [place] Pharmacist Assistant  [phone]

## 2022-08-30 ENCOUNTER — Ambulatory Visit (INDEPENDENT_AMBULATORY_CARE_PROVIDER_SITE_OTHER): Payer: Medicare Other

## 2022-08-30 DIAGNOSIS — R300 Dysuria: Secondary | ICD-10-CM | POA: Diagnosis not present

## 2022-08-30 DIAGNOSIS — N3001 Acute cystitis with hematuria: Secondary | ICD-10-CM | POA: Diagnosis not present

## 2022-08-30 DIAGNOSIS — E1159 Type 2 diabetes mellitus with other circulatory complications: Secondary | ICD-10-CM | POA: Diagnosis not present

## 2022-08-30 DIAGNOSIS — E782 Mixed hyperlipidemia: Secondary | ICD-10-CM | POA: Diagnosis not present

## 2022-08-30 DIAGNOSIS — I152 Hypertension secondary to endocrine disorders: Secondary | ICD-10-CM | POA: Diagnosis not present

## 2022-08-30 LAB — POCT URINALYSIS DIP (CLINITEK)
Bilirubin, UA: NEGATIVE
Glucose, UA: NEGATIVE mg/dL
Ketones, POC UA: NEGATIVE mg/dL
Leukocytes, UA: NEGATIVE
Nitrite, UA: POSITIVE — AB
POC PROTEIN,UA: 30 — AB
Spec Grav, UA: 1.025 (ref 1.010–1.025)
Urobilinogen, UA: 0.2 E.U./dL
pH, UA: 6 (ref 5.0–8.0)

## 2022-08-30 NOTE — Progress Notes (Signed)
Patient is here to check UA.  UA came back with positive nitrites and trace of blood.  Urine culture was ordered.

## 2022-08-31 LAB — CBC WITH DIFFERENTIAL/PLATELET
Basophils Absolute: 0 10*3/uL (ref 0.0–0.2)
Basos: 0 %
EOS (ABSOLUTE): 0 10*3/uL (ref 0.0–0.4)
Eos: 0 %
Hematocrit: 30.5 % — ABNORMAL LOW (ref 34.0–46.6)
Hemoglobin: 9.9 g/dL — ABNORMAL LOW (ref 11.1–15.9)
Immature Grans (Abs): 0.4 10*3/uL — ABNORMAL HIGH (ref 0.0–0.1)
Immature Granulocytes: 3 %
Lymphocytes Absolute: 0.5 10*3/uL — ABNORMAL LOW (ref 0.7–3.1)
Lymphs: 3 %
MCH: 28.4 pg (ref 26.6–33.0)
MCHC: 32.5 g/dL (ref 31.5–35.7)
MCV: 87 fL (ref 79–97)
Monocytes Absolute: 0.7 10*3/uL (ref 0.1–0.9)
Monocytes: 5 %
Neutrophils Absolute: 13.3 10*3/uL — ABNORMAL HIGH (ref 1.4–7.0)
Neutrophils: 89 %
Platelets: 270 10*3/uL (ref 150–450)
RBC: 3.49 x10E6/uL — ABNORMAL LOW (ref 3.77–5.28)
RDW: 16.2 % — ABNORMAL HIGH (ref 11.7–15.4)
WBC: 14.9 10*3/uL — ABNORMAL HIGH (ref 3.4–10.8)

## 2022-08-31 LAB — LIPID PANEL
Chol/HDL Ratio: 2.2 ratio (ref 0.0–4.4)
Cholesterol, Total: 203 mg/dL — ABNORMAL HIGH (ref 100–199)
HDL: 92 mg/dL (ref >39)
LDL Chol Calc (NIH): 90 mg/dL (ref 0–99)
Triglycerides: 121 mg/dL (ref 0–149)
VLDL Cholesterol Cal: 21 mg/dL (ref 5–40)

## 2022-08-31 LAB — COMPREHENSIVE METABOLIC PANEL
ALT: 21 IU/L (ref 0–32)
AST: 24 IU/L (ref 0–40)
Albumin/Globulin Ratio: 1.5 (ref 1.2–2.2)
Albumin: 4 g/dL (ref 3.8–4.8)
Alkaline Phosphatase: 69 IU/L (ref 44–121)
BUN/Creatinine Ratio: 20 (ref 12–28)
BUN: 28 mg/dL — ABNORMAL HIGH (ref 8–27)
Bilirubin Total: <0.2 mg/dL (ref 0.0–1.2)
CO2: 21 mmol/L (ref 20–29)
Calcium: 9.1 mg/dL (ref 8.7–10.3)
Chloride: 106 mmol/L (ref 96–106)
Creatinine, Ser: 1.37 mg/dL — ABNORMAL HIGH (ref 0.57–1.00)
Globulin, Total: 2.6 g/dL (ref 1.5–4.5)
Glucose: 146 mg/dL — ABNORMAL HIGH (ref 70–99)
Potassium: 4 mmol/L (ref 3.5–5.2)
Sodium: 145 mmol/L — ABNORMAL HIGH (ref 134–144)
Total Protein: 6.6 g/dL (ref 6.0–8.5)
eGFR: 39 mL/min/{1.73_m2} — ABNORMAL LOW (ref >59)

## 2022-08-31 LAB — HEMOGLOBIN A1C
Est. average glucose Bld gHb Est-mCnc: 131 mg/dL
Hgb A1c MFr Bld: 6.2 % — ABNORMAL HIGH (ref 4.8–5.6)

## 2022-08-31 LAB — CARDIOVASCULAR RISK ASSESSMENT

## 2022-09-01 NOTE — Progress Notes (Signed)
Blood count abnormal. mild anemia. Fairly stable. Wbc elevated. Patient has a UTI. Liver function normal.  Kidney function abnormal, but stable.  Cholesterol: good HBA1C: 6.2

## 2022-09-02 ENCOUNTER — Encounter: Payer: Self-pay | Admitting: Family Medicine

## 2022-09-02 NOTE — Assessment & Plan Note (Signed)
The current medical regimen is effective;  continue present plan and medications. Continue clonidine 0.1 mg twice daily, isordil 10 mg three times a day,

## 2022-09-02 NOTE — Assessment & Plan Note (Signed)
Heartburn: Add famotidine 20 mg before supper.

## 2022-09-02 NOTE — Assessment & Plan Note (Signed)
Continue gabapentin.

## 2022-09-02 NOTE — Assessment & Plan Note (Signed)
Well controlled.  No changes to medicines. Continue atorvastatin 10 mg before bed.  Continue to work on eating a healthy diet and exercise.  Labs drawn today.   

## 2022-09-02 NOTE — Assessment & Plan Note (Signed)
The current medical regimen is effective;  continue present plan and medications. Management per specialist.   

## 2022-09-02 NOTE — Assessment & Plan Note (Signed)
Urinary tract infection: Sent Cipro 250 mg twice daily for 7 days.  If symptoms do not improve within the first 2 to 3 days, patient should return for repeat urinalysis.

## 2022-09-02 NOTE — Telephone Encounter (Signed)
Patient states that she is taking Furosemide 20 mg every other day.

## 2022-09-02 NOTE — Assessment & Plan Note (Signed)
Risks were discussed including bleeding, infection, increase in sugars if diabetic, atrophy at site of injection, and increased pain.  After consent was obtained, using sterile technique the Left knee was prepped with alcohol.  The joint was entered and Kenalog 80 mg and 5 ml plain Lidocaine was then injected and the needle withdrawn.  The procedure was well tolerated.   The patient is asked to continue to rest the joint for a few more days before resuming regular activities.  It may be more painful for the first 1-2 days.  Watch for fever, or increased swelling or persistent pain in the joint. Call or return to clinic prn if such symptoms occur or there is failure to improve as anticipated.

## 2022-09-02 NOTE — Assessment & Plan Note (Signed)
Risks were discussed including bleeding, infection, increase in sugars if diabetic, atrophy at site of injection, and increased pain.  After consent was obtained, using sterile technique the right knee was prepped with alcohol.  The joint was entered and Kenalog 80 mg and 5 ml plain Lidocaine was then injected and the needle withdrawn.  The procedure was well tolerated.   The patient is asked to continue to rest the joint for a few more days before resuming regular activities.  It may be more painful for the first 1-2 days.  Watch for fever, or increased swelling or persistent pain in the joint. Call or return to clinic prn if such symptoms occur or there is failure to improve as anticipated.  

## 2022-09-02 NOTE — Assessment & Plan Note (Signed)
Continue singulair.  Continue trelegy one inhalation daily. Continue albuterol hfa 2 puffs four times a day as needed.

## 2022-09-04 LAB — URINE CULTURE

## 2022-09-05 ENCOUNTER — Other Ambulatory Visit: Payer: Self-pay

## 2022-09-05 MED ORDER — NITROFURANTOIN MONOHYD MACRO 100 MG PO CAPS: 100.0000 mg | ORAL_CAPSULE | Freq: Two times a day (BID) | ORAL | 0 refills | Status: DC

## 2022-09-06 ENCOUNTER — Other Ambulatory Visit: Payer: Self-pay | Admitting: Family Medicine

## 2022-09-12 ENCOUNTER — Ambulatory Visit: Payer: Medicare Other

## 2022-09-16 DIAGNOSIS — E669 Obesity, unspecified: Secondary | ICD-10-CM | POA: Diagnosis not present

## 2022-09-16 DIAGNOSIS — J849 Interstitial pulmonary disease, unspecified: Secondary | ICD-10-CM | POA: Diagnosis not present

## 2022-09-16 DIAGNOSIS — Z6836 Body mass index (BMI) 36.0-36.9, adult: Secondary | ICD-10-CM | POA: Diagnosis not present

## 2022-09-16 DIAGNOSIS — M48061 Spinal stenosis, lumbar region without neurogenic claudication: Secondary | ICD-10-CM | POA: Diagnosis not present

## 2022-09-16 DIAGNOSIS — M1991 Primary osteoarthritis, unspecified site: Secondary | ICD-10-CM | POA: Diagnosis not present

## 2022-09-16 DIAGNOSIS — M0579 Rheumatoid arthritis with rheumatoid factor of multiple sites without organ or systems involvement: Secondary | ICD-10-CM | POA: Diagnosis not present

## 2022-09-23 ENCOUNTER — Other Ambulatory Visit: Payer: Self-pay

## 2022-09-23 DIAGNOSIS — M48062 Spinal stenosis, lumbar region with neurogenic claudication: Secondary | ICD-10-CM

## 2022-09-23 DIAGNOSIS — M150 Primary generalized (osteo)arthritis: Secondary | ICD-10-CM

## 2022-09-23 MED ORDER — HYDROCODONE-ACETAMINOPHEN 5-325 MG PO TABS: 1.0000 | ORAL_TABLET | Freq: Four times a day (QID) | ORAL | 0 refills | Status: DC | PRN

## 2022-09-24 ENCOUNTER — Ambulatory Visit (INDEPENDENT_AMBULATORY_CARE_PROVIDER_SITE_OTHER): Payer: Medicare Other | Admitting: Family Medicine

## 2022-09-24 ENCOUNTER — Other Ambulatory Visit: Payer: Self-pay | Admitting: Family Medicine

## 2022-09-24 VITALS — BP 124/76 | HR 84 | Temp 97.2°F | Resp 18 | Ht 61.0 in | Wt 202.0 lb

## 2022-09-24 DIAGNOSIS — N3001 Acute cystitis with hematuria: Secondary | ICD-10-CM | POA: Diagnosis not present

## 2022-09-24 DIAGNOSIS — Z23 Encounter for immunization: Secondary | ICD-10-CM | POA: Diagnosis not present

## 2022-09-24 DIAGNOSIS — R600 Localized edema: Secondary | ICD-10-CM | POA: Diagnosis not present

## 2022-09-24 DIAGNOSIS — L98491 Non-pressure chronic ulcer of skin of other sites limited to breakdown of skin: Secondary | ICD-10-CM

## 2022-09-24 LAB — POCT URINALYSIS DIPSTICK
Bilirubin, UA: NEGATIVE
Glucose, UA: NEGATIVE
Ketones, UA: NEGATIVE
Nitrite, UA: NEGATIVE
Protein, UA: POSITIVE — AB
Spec Grav, UA: >=1.03 — AB (ref 1.010–1.025)
Urobilinogen, UA: 0.2 E.U./dL
pH, UA: 5 (ref 5.0–8.0)

## 2022-09-24 MED ORDER — DOXYCYCLINE HYCLATE 100 MG PO TABS: 100.0000 mg | ORAL_TABLET | Freq: Two times a day (BID) | ORAL | 0 refills | Status: AC

## 2022-09-24 NOTE — Progress Notes (Signed)
Acute Office Visit  Subjective:    Patient ID: Sherry Burke, female    DOB: 07-16-1941, 81 y.o.   MRN: [id number]  Chief Complaint  Patient presents with   Edema    HPI: Patient is in today for increased edema lower legs with increased shortness of breath.  Requesting recheck of urine. Patient frequently has UTIs without symptoms.   Past Medical History:  Diagnosis Date   Acute renal insufficiency    Asthma    Chronic bronchitis    COPD (chronic obstructive pulmonary disease) (HCC)    Depression    History of migraine headaches    Hyperlipemia    OSA (obstructive sleep apnea)    pt states she doesn't have it   Osteoarthritis    Pneumonia    Rheumatoid arthritis(714.0)    RLS (restless legs syndrome)    Vitamin D deficiency disease     Past Surgical History:  Procedure Laterality Date   KNEE SURGERY Bilateral    when younger   TOTAL ABDOMINAL HYSTERECTOMY  06/1988    Family History  Problem Relation Age of Onset   Lung cancer Father    Stroke Mother    Hypertension Mother    Diabetes Mother    Rheum arthritis Sister    Rheum arthritis Sister     Social History   Socioeconomic History   Marital status: Married    Spouse name: Durene Fruits   Number of children: 3   Years of education: Not on file   Highest education level: Not on file  Occupational History   Occupation: retired    Comment: homemaker  Tobacco Use   Smoking status: Never    Passive exposure: Yes   Smokeless tobacco: Never  Vaping Use   Vaping Use: Never used  Substance and Sexual Activity   Alcohol use: No   Drug use: No   Sexual activity: Not Currently    Birth control/protection: None    Comment: Married  Other Topics Concern   Not on file  Social History Narrative   Lives with her husband, who is very helpful and supportive.   Social Determinants of Health   Financial Resource Strain: Low Risk  (07/25/2022)   Overall Financial Resource Strain (CARDIA)    Difficulty of Paying  Living Expenses: Not hard at all  Food Insecurity: No Food Insecurity (05/23/2022)   Hunger Vital Sign    Worried About Running Out of Food in the Last Year: Never true    Ran Out of Food in the Last Year: Never true  Transportation Needs: No Transportation Needs (05/23/2022)   PRAPARE - Hydrologist (Medical): No    Lack of Transportation (Non-Medical): No  Physical Activity: Inactive (07/25/2022)   Exercise Vital Sign    Days of Exercise per Week: 0 days    Minutes of Exercise per Session: 0 min  Stress: No Stress Concern Present (05/23/2022)   [place]    Feeling of Stress : Only a little  Social Connections: Moderately Isolated (05/23/2022)   Social Connection and Isolation Panel [NHANES]    Frequency of Communication with Friends and Family: More than three times a week    Frequency of Social Gatherings with Friends and Family: More than three times a week    Attends Religious Services: Never    Marine scientist or Organizations: No    Attends Archivist Meetings: Never  Marital Status: Married  Human resources officer Violence: Not At Risk (05/23/2022)   Humiliation, Afraid, Rape, and Kick questionnaire    Fear of Current or Ex-Partner: No    Emotionally Abused: No    Physically Abused: No    Sexually Abused: No    Outpatient Medications Prior to Visit  Medication Sig Dispense Refill   furosemide (LASIX) 20 MG tablet Take 1 tablet (20 mg total) by mouth daily. (Patient taking differently: Take 20 mg by mouth daily. Taking every other day because of frequent urination.) 30 tablet 0   albuterol (VENTOLIN HFA) 108 (90 Base) MCG/ACT inhaler Inhale 2 puffs into the lungs every 6 (six) hours as needed for wheezing or shortness of breath. 18 g 11   atorvastatin (LIPITOR) 40 MG tablet Take 1 tablet by mouth once daily 90 tablet 0   cloNIDine (CATAPRES) 0.1 MG tablet Take 1 tablet by  mouth twice daily 180 tablet 0   cyanocobalamin 2000 MCG tablet Take 2,000 mcg by mouth daily.     famotidine (PEPCID) 20 MG tablet Take 1 tablet (20 mg total) by mouth daily. 30 tablet 2   ferrous sulfate 325 (65 FE) MG tablet Take 325 mg by mouth daily with breakfast.     Fluticasone-Umeclidin-Vilant (TRELEGY ELLIPTA) 200-62.5-25 MCG/ACT AEPB Inhale 1 puff into the lungs daily. 180 each 3   gabapentin (NEURONTIN) 300 MG capsule Take 1 capsule by mouth twice daily 180 capsule 0   HYDROcodone-acetaminophen (NORCO/VICODIN) 5-325 MG tablet Take 1 tablet by mouth every 6 (six) hours as needed for moderate pain. 120 tablet 0   hydroxychloroquine (PLAQUENIL) 200 MG tablet Take 1 tablet (200 mg total) by mouth 2 (two) times daily. 60 tablet 2   isosorbide dinitrate (ISORDIL) 10 MG tablet Take 10 mg by mouth 3 (three) times daily. (Patient not taking: Reported on 07/05/2022)     leflunomide (ARAVA) 20 MG tablet Take 20 mg by mouth daily.     magic mouthwash (lidocaine, diphenhydrAMINE, alum & mag hydroxide) suspension Swish and swallow 5 mLs 4 (four) times daily as needed for mouth pain. 360 mL 1   montelukast (SINGULAIR) 10 MG tablet Take 10 mg by mouth at bedtime.     omeprazole (PRILOSEC) 40 MG capsule Take 1 capsule (40 mg total) by mouth 2 (two) times daily. 180 capsule 1   ONETOUCH ULTRA test strip USE TO CHECK BLOOD SUGAR ONCE DAILY IN THE MORNING     PARoxetine (PAXIL-CR) 25 MG 24 hr tablet Take 1 tablet by mouth once daily 90 tablet 0   rOPINIRole (REQUIP) 1 MG tablet TAKE 1 TABLET EVERY MORNINGAND 2 TABLETS AT BEDTIME 270 tablet 0   Spacer/Aero-Holding Chambers DEVI Use with inhaler 1 each 0   Tofacitinib Citrate (XELJANZ) 5 MG TABS Take 5 mg by mouth 2 (two) times daily. Take twice a day.     Vitamin D, Ergocalciferol, (DRISDOL) 1.25 MG (50000 UNIT) CAPS capsule Take 1 capsule by mouth once a week 12 capsule 0   nitrofurantoin, macrocrystal-monohydrate, (MACROBID) 100 MG capsule Take 1 capsule  (100 mg total) by mouth daily. 90 capsule 1   nitrofurantoin, macrocrystal-monohydrate, (MACROBID) 100 MG capsule Take 1 capsule (100 mg total) by mouth 2 (two) times daily. 14 capsule 0   No facility-administered medications prior to visit.    Allergies  Allergen Reactions   Sulfa Antibiotics Nausea And Vomiting   Actemra [Tocilizumab]    Amlodipine    Infliximab Nausea And Vomiting and Other (See Comments)  Affects nervous system Other reaction(s): vomiting   Leflunomide     Other reaction(s): thrombocytopenia   Methotrexate Derivatives Other (See Comments)    sweating   Niaspan [Niacin]     Severe flushing   Sulfasalazine     Other reaction(s): stomach   Upadacitinib     Other reaction(s): elevated lfts   Welchol [Colesevelam] Other (See Comments)    Abdominal pain and headache    Review of Systems  Constitutional:  Positive for fatigue. Negative for chills and fever.  Respiratory:  Positive for shortness of breath.   Cardiovascular:  Positive for leg swelling. Negative for chest pain and palpitations.  Gastrointestinal:  Negative for abdominal distention.  Genitourinary:  Positive for dysuria and urgency.  Musculoskeletal:  Positive for arthralgias and myalgias.  Neurological:  Positive for headaches.  Psychiatric/Behavioral:  Negative for dysphoric mood. The patient is not nervous/anxious.        Objective:    Physical Exam Vitals reviewed.  Constitutional:      Appearance: Normal appearance. She is obese.  Neck:     Vascular: No carotid bruit.  Cardiovascular:     Rate and Rhythm: Normal rate and regular rhythm.     Heart sounds: Normal heart sounds.  Pulmonary:     Effort: Pulmonary effort is normal. No respiratory distress.     Breath sounds: Normal breath sounds.  Abdominal:     General: Abdomen is flat. Bowel sounds are normal.     Palpations: Abdomen is soft.     Tenderness: There is no abdominal tenderness.  Musculoskeletal:     Right lower  leg: Edema present.     Left lower leg: Edema present.  Skin:    Comments: Small ulcerations on legs. Mild Weeping.   Neurological:     Mental Status: She is alert and oriented to person, place, and time.  Psychiatric:        Mood and Affect: Mood normal.        Behavior: Behavior normal.     BP 124/76   Pulse 84   Temp (!) 97.2 F (36.2 C)   Resp 18   Ht 5' 1" (1.549 m)   Wt 202 lb (91.6 kg)   BMI 38.17 kg/m  Wt Readings from Last 3 Encounters:  09/24/22 202 lb (91.6 kg)  08/28/22 200 lb (90.7 kg)  08/15/22 200 lb (90.7 kg)    Health Maintenance Due  Topic Date Due   Zoster Vaccines- Shingrix (1 of 2) Never done   Diabetic kidney evaluation - Urine ACR  01/01/2019   COVID-19 Vaccine (4 - Moderna risk series) 10/17/2021   FOOT EXAM  12/15/2021    There are no preventive care reminders to display for this patient.   Lab Results  Component Value Date   TSH 2.080 03/30/2021   Lab Results  Component Value Date   WBC 14.9 (H) 08/30/2022   HGB 9.9 (L) 08/30/2022   HCT 30.5 (L) 08/30/2022   MCV 87 08/30/2022   PLT 270 08/30/2022   Lab Results  Component Value Date   NA 145 (H) 08/30/2022   K 4.0 08/30/2022   CO2 21 08/30/2022   GLUCOSE 146 (H) 08/30/2022   BUN 28 (H) 08/30/2022   CREATININE 1.37 (H) 08/30/2022   BILITOT <0.2 08/30/2022   ALKPHOS 69 08/30/2022   AST 24 08/30/2022   ALT 21 08/30/2022   PROT 6.6 08/30/2022   ALBUMIN 4.0 08/30/2022   CALCIUM 9.1 08/30/2022   EGFR 39 (  L) 08/30/2022   GFR 37.52 (L) 01/08/2022   Lab Results  Component Value Date   CHOL 203 (H) 08/30/2022   Lab Results  Component Value Date   HDL 92 08/30/2022   Lab Results  Component Value Date   LDLCALC 90 08/30/2022   Lab Results  Component Value Date   TRIG 121 08/30/2022   Lab Results  Component Value Date   CHOLHDL 2.2 08/30/2022   Lab Results  Component Value Date   HGBA1C 6.2 (H) 08/30/2022       Assessment & Plan:  Unna boots BL applied.   Problem List Items Addressed This Visit       Musculoskeletal and Integument   Stage II skin ulcer ([place])    Unna boots BL applied.         Genitourinary   UTI (urinary tract infection) - Primary    Check UA. Ordered Urine culture.      Relevant Orders   POCT urinalysis dipstick (Completed)   Urine Culture (Completed)     Other   Pedal edema    Unna boots BL applied.       Encounter for immunization   Relevant Orders   Pfizer Fall 2023 Covid-19 Vaccine 63yr and older (Completed)   Need for immunization against influenza   Relevant Orders   Flu Vaccine QUAD High Dose(Fluad) (Completed)   No orders of the defined types were placed in this encounter.   Orders Placed This Encounter  Procedures   Urine Culture   Flu Vaccine QUAD High Dose(Fluad)   Pfizer Fall 2023 Covid-19 Vaccine 128yrand older   POCT urinalysis dipstick     Follow-up: Return in about 6 days (around 09/30/2022) for with sally (20 min).  An After Visit Summary was printed and given to the patient. KiRochel BromeMD [facility] [phone]

## 2022-09-25 ENCOUNTER — Other Ambulatory Visit: Payer: Self-pay | Admitting: Family Medicine

## 2022-09-25 MED ORDER — NALOXONE HCL 4 MG/0.1ML NA LIQD: 1.0000 | Freq: Once | NASAL | 0 refills | Status: AC

## 2022-09-27 LAB — URINE CULTURE

## 2022-09-29 DIAGNOSIS — Z23 Encounter for immunization: Secondary | ICD-10-CM | POA: Insufficient documentation

## 2022-09-29 DIAGNOSIS — L98499 Non-pressure chronic ulcer of skin of other sites with unspecified severity: Secondary | ICD-10-CM | POA: Insufficient documentation

## 2022-09-29 NOTE — Assessment & Plan Note (Signed)
Unna boots BL applied.

## 2022-09-29 NOTE — Assessment & Plan Note (Signed)
Check UA. Ordered Urine culture.

## 2022-09-30 ENCOUNTER — Telehealth: Payer: Self-pay

## 2022-09-30 NOTE — Progress Notes (Signed)
Chronic Care Management Pharmacy Assistant   Name: Sherry Burke  MRN: [id number] DOB: October 04, 1941   Reason for Encounter: Disease State call for HTN   Recent office visits:  09/25/22 Sherry Brome MD. Orders Only. Ordered Narcan Nasal Spray.   09/25/22 Sherry Brome MD. Orders Only. Ordered Doxycycline '100mg'$ .   09/24/22 Sherry Brome MD. Seen for Edema. No med changes.  Recent consult visits:  None  Hospital visits:  None  Medications: Outpatient Encounter Medications as of 09/30/2022  Medication Sig   albuterol (VENTOLIN HFA) 108 (90 Base) MCG/ACT inhaler Inhale 2 puffs into the lungs every 6 (six) hours as needed for wheezing or shortness of breath.   atorvastatin (LIPITOR) 40 MG tablet Take 1 tablet by mouth once daily   cloNIDine (CATAPRES) 0.1 MG tablet Take 1 tablet by mouth twice daily   cyanocobalamin 2000 MCG tablet Take 2,000 mcg by mouth daily.   doxycycline (VIBRA-TABS) 100 MG tablet Take 1 tablet (100 mg total) by mouth 2 (two) times daily.   famotidine (PEPCID) 20 MG tablet Take 1 tablet (20 mg total) by mouth daily.   ferrous sulfate 325 (65 FE) MG tablet Take 325 mg by mouth daily with breakfast.   Fluticasone-Umeclidin-Vilant (TRELEGY ELLIPTA) 200-62.5-25 MCG/ACT AEPB Inhale 1 puff into the lungs daily.   furosemide (LASIX) 20 MG tablet Take 1 tablet (20 mg total) by mouth daily. (Patient taking differently: Take 20 mg by mouth daily. Taking every other day because of frequent urination.)   gabapentin (NEURONTIN) 300 MG capsule Take 1 capsule by mouth twice daily   HYDROcodone-acetaminophen (NORCO/VICODIN) 5-325 MG tablet Take 1 tablet by mouth every 6 (six) hours as needed for moderate pain.   hydroxychloroquine (PLAQUENIL) 200 MG tablet Take 1 tablet (200 mg total) by mouth 2 (two) times daily.   isosorbide dinitrate (ISORDIL) 10 MG tablet Take 10 mg by mouth 3 (three) times daily. (Patient not taking: Reported on 07/05/2022)   leflunomide (ARAVA) 20 MG tablet  Take 20 mg by mouth daily.   magic mouthwash (lidocaine, diphenhydrAMINE, alum & mag hydroxide) suspension Swish and swallow 5 mLs 4 (four) times daily as needed for mouth pain.   montelukast (SINGULAIR) 10 MG tablet Take 10 mg by mouth at bedtime.   omeprazole (PRILOSEC) 40 MG capsule Take 1 capsule (40 mg total) by mouth 2 (two) times daily.   ONETOUCH ULTRA test strip USE TO CHECK BLOOD SUGAR ONCE DAILY IN THE MORNING   PARoxetine (PAXIL-CR) 25 MG 24 hr tablet Take 1 tablet by mouth once daily   rOPINIRole (REQUIP) 1 MG tablet TAKE 1 TABLET EVERY MORNINGAND 2 TABLETS AT BEDTIME   Spacer/Aero-Holding Chambers DEVI Use with inhaler   Tofacitinib Citrate (XELJANZ) 5 MG TABS Take 5 mg by mouth 2 (two) times daily. Take twice a day.   Vitamin D, Ergocalciferol, (DRISDOL) 1.25 MG (50000 UNIT) CAPS capsule Take 1 capsule by mouth once a week   No facility-administered encounter medications on file as of 09/30/2022.     Recent Office Vitals: BP Readings from Last 3 Encounters:  09/24/22 124/76  08/28/22 130/78  08/15/22 136/76   Pulse Readings from Last 3 Encounters:  09/24/22 84  08/28/22 78  08/15/22 (!) 105    Wt Readings from Last 3 Encounters:  09/24/22 202 lb (91.6 kg)  08/28/22 200 lb (90.7 kg)  08/15/22 200 lb (90.7 kg)     Kidney Function Lab Results  Component Value Date/Time   CREATININE 1.37 (H) 08/30/2022 09:11 AM  CREATININE 1.34 (H) 07/05/2022 12:17 PM   GFR 37.52 (L) 01/08/2022 09:46 AM   GFRNONAA 40 (L) 01/01/2021 10:16 AM   GFRAA 46 (L) 01/01/2021 10:16 AM       Latest Ref Rng & Units 08/30/2022    9:11 AM 07/05/2022   12:17 PM 05/07/2022   10:17 AM  BMP  Glucose 70 - 99 mg/dL 146  87  108   BUN 8 - 27 mg/dL '28  26  23   '$ Creatinine 0.57 - 1.00 mg/dL 1.37  1.34  1.37   BUN/Creat Ratio 12 - '28 20  19  17   '$ Sodium 134 - 144 mmol/L 145  144  141   Potassium 3.5 - 5.2 mmol/L 4.0  4.6  4.0   Chloride 96 - 106 mmol/L 106  106  103   CO2 20 - 29 mmol/L '21   21  21   '$ Calcium 8.7 - 10.3 mg/dL 9.1  9.3  9.1      Current antihypertensive regimen:  Clonidine 0.1 mg bid  Furosemide '20mg'$  every other day  Adherence Review: Is the patient currently on ACE/ARB medication? No Does the patient have >5 day gap between last estimated fill dates? CPP to review  Care Gaps: Last annual wellness visit?05/23/22  Star Rating Drugs:  Medication:  Last Fill: Day Supply None noted   Pt is in the hospital and going to a rehab facility for 3 weeks to recover for a heart condition per Husband.    Sherry Burke, [place] Pharmacist Assistant  [phone]

## 2022-10-01 ENCOUNTER — Ambulatory Visit: Payer: Medicare Other | Admitting: Physician Assistant

## 2022-10-01 DIAGNOSIS — R7989 Other specified abnormal findings of blood chemistry: Secondary | ICD-10-CM | POA: Diagnosis not present

## 2022-10-01 DIAGNOSIS — D649 Anemia, unspecified: Secondary | ICD-10-CM | POA: Diagnosis present

## 2022-10-01 DIAGNOSIS — R0602 Shortness of breath: Secondary | ICD-10-CM | POA: Diagnosis not present

## 2022-10-01 DIAGNOSIS — J9600 Acute respiratory failure, unspecified whether with hypoxia or hypercapnia: Secondary | ICD-10-CM | POA: Diagnosis not present

## 2022-10-01 DIAGNOSIS — I5023 Acute on chronic systolic (congestive) heart failure: Secondary | ICD-10-CM | POA: Diagnosis not present

## 2022-10-01 DIAGNOSIS — G629 Polyneuropathy, unspecified: Secondary | ICD-10-CM | POA: Diagnosis not present

## 2022-10-01 DIAGNOSIS — R9082 White matter disease, unspecified: Secondary | ICD-10-CM | POA: Diagnosis not present

## 2022-10-01 DIAGNOSIS — J8410 Pulmonary fibrosis, unspecified: Secondary | ICD-10-CM | POA: Diagnosis present

## 2022-10-01 DIAGNOSIS — M6281 Muscle weakness (generalized): Secondary | ICD-10-CM | POA: Diagnosis not present

## 2022-10-01 DIAGNOSIS — L03116 Cellulitis of left lower limb: Secondary | ICD-10-CM | POA: Diagnosis not present

## 2022-10-01 DIAGNOSIS — R278 Other lack of coordination: Secondary | ICD-10-CM | POA: Diagnosis not present

## 2022-10-01 DIAGNOSIS — E7800 Pure hypercholesterolemia, unspecified: Secondary | ICD-10-CM | POA: Diagnosis present

## 2022-10-01 DIAGNOSIS — F419 Anxiety disorder, unspecified: Secondary | ICD-10-CM | POA: Diagnosis not present

## 2022-10-01 DIAGNOSIS — I252 Old myocardial infarction: Secondary | ICD-10-CM | POA: Diagnosis not present

## 2022-10-01 DIAGNOSIS — N183 Chronic kidney disease, stage 3 unspecified: Secondary | ICD-10-CM | POA: Diagnosis not present

## 2022-10-01 DIAGNOSIS — I130 Hypertensive heart and chronic kidney disease with heart failure and stage 1 through stage 4 chronic kidney disease, or unspecified chronic kidney disease: Secondary | ICD-10-CM | POA: Diagnosis not present

## 2022-10-01 DIAGNOSIS — R5383 Other fatigue: Secondary | ICD-10-CM | POA: Diagnosis not present

## 2022-10-01 DIAGNOSIS — I161 Hypertensive emergency: Secondary | ICD-10-CM | POA: Diagnosis not present

## 2022-10-01 DIAGNOSIS — I340 Nonrheumatic mitral (valve) insufficiency: Secondary | ICD-10-CM | POA: Diagnosis not present

## 2022-10-01 DIAGNOSIS — J9611 Chronic respiratory failure with hypoxia: Secondary | ICD-10-CM | POA: Diagnosis not present

## 2022-10-01 DIAGNOSIS — I351 Nonrheumatic aortic (valve) insufficiency: Secondary | ICD-10-CM | POA: Diagnosis not present

## 2022-10-01 DIAGNOSIS — B372 Candidiasis of skin and nail: Secondary | ICD-10-CM | POA: Diagnosis not present

## 2022-10-01 DIAGNOSIS — Z79899 Other long term (current) drug therapy: Secondary | ICD-10-CM | POA: Diagnosis not present

## 2022-10-01 DIAGNOSIS — I361 Nonrheumatic tricuspid (valve) insufficiency: Secondary | ICD-10-CM | POA: Diagnosis not present

## 2022-10-01 DIAGNOSIS — N390 Urinary tract infection, site not specified: Secondary | ICD-10-CM | POA: Diagnosis present

## 2022-10-01 DIAGNOSIS — R2689 Other abnormalities of gait and mobility: Secondary | ICD-10-CM | POA: Diagnosis not present

## 2022-10-01 DIAGNOSIS — I10 Essential (primary) hypertension: Secondary | ICD-10-CM | POA: Diagnosis not present

## 2022-10-01 DIAGNOSIS — Z7401 Bed confinement status: Secondary | ICD-10-CM | POA: Diagnosis not present

## 2022-10-01 DIAGNOSIS — I493 Ventricular premature depolarization: Secondary | ICD-10-CM | POA: Diagnosis not present

## 2022-10-01 DIAGNOSIS — K449 Diaphragmatic hernia without obstruction or gangrene: Secondary | ICD-10-CM | POA: Diagnosis not present

## 2022-10-01 DIAGNOSIS — I4891 Unspecified atrial fibrillation: Secondary | ICD-10-CM | POA: Diagnosis not present

## 2022-10-01 DIAGNOSIS — J45909 Unspecified asthma, uncomplicated: Secondary | ICD-10-CM | POA: Diagnosis present

## 2022-10-01 DIAGNOSIS — N1832 Chronic kidney disease, stage 3b: Secondary | ICD-10-CM | POA: Diagnosis present

## 2022-10-01 DIAGNOSIS — J90 Pleural effusion, not elsewhere classified: Secondary | ICD-10-CM | POA: Diagnosis not present

## 2022-10-01 DIAGNOSIS — R2681 Unsteadiness on feet: Secondary | ICD-10-CM | POA: Diagnosis not present

## 2022-10-01 DIAGNOSIS — R0689 Other abnormalities of breathing: Secondary | ICD-10-CM | POA: Diagnosis not present

## 2022-10-01 DIAGNOSIS — K219 Gastro-esophageal reflux disease without esophagitis: Secondary | ICD-10-CM | POA: Diagnosis not present

## 2022-10-01 DIAGNOSIS — F32A Depression, unspecified: Secondary | ICD-10-CM | POA: Diagnosis not present

## 2022-10-01 DIAGNOSIS — M810 Age-related osteoporosis without current pathological fracture: Secondary | ICD-10-CM | POA: Diagnosis not present

## 2022-10-01 DIAGNOSIS — G9341 Metabolic encephalopathy: Secondary | ICD-10-CM | POA: Diagnosis not present

## 2022-10-01 DIAGNOSIS — G2581 Restless legs syndrome: Secondary | ICD-10-CM | POA: Diagnosis present

## 2022-10-01 DIAGNOSIS — E785 Hyperlipidemia, unspecified: Secondary | ICD-10-CM | POA: Diagnosis not present

## 2022-10-01 DIAGNOSIS — J9621 Acute and chronic respiratory failure with hypoxia: Secondary | ICD-10-CM | POA: Diagnosis not present

## 2022-10-01 DIAGNOSIS — R000 Tachycardia, unspecified: Secondary | ICD-10-CM | POA: Diagnosis not present

## 2022-10-01 DIAGNOSIS — J45901 Unspecified asthma with (acute) exacerbation: Secondary | ICD-10-CM | POA: Diagnosis not present

## 2022-10-01 DIAGNOSIS — I509 Heart failure, unspecified: Secondary | ICD-10-CM | POA: Diagnosis not present

## 2022-10-01 DIAGNOSIS — J9811 Atelectasis: Secondary | ICD-10-CM | POA: Diagnosis not present

## 2022-10-01 DIAGNOSIS — M069 Rheumatoid arthritis, unspecified: Secondary | ICD-10-CM | POA: Diagnosis not present

## 2022-10-01 DIAGNOSIS — R0902 Hypoxemia: Secondary | ICD-10-CM | POA: Diagnosis not present

## 2022-10-01 DIAGNOSIS — I2510 Atherosclerotic heart disease of native coronary artery without angina pectoris: Secondary | ICD-10-CM | POA: Diagnosis not present

## 2022-10-01 DIAGNOSIS — R531 Weakness: Secondary | ICD-10-CM | POA: Diagnosis present

## 2022-10-01 DIAGNOSIS — N179 Acute kidney failure, unspecified: Secondary | ICD-10-CM | POA: Diagnosis not present

## 2022-10-01 DIAGNOSIS — Z9981 Dependence on supplemental oxygen: Secondary | ICD-10-CM | POA: Diagnosis not present

## 2022-10-04 DIAGNOSIS — R000 Tachycardia, unspecified: Secondary | ICD-10-CM

## 2022-10-04 DIAGNOSIS — I493 Ventricular premature depolarization: Secondary | ICD-10-CM | POA: Diagnosis not present

## 2022-10-05 DIAGNOSIS — I340 Nonrheumatic mitral (valve) insufficiency: Secondary | ICD-10-CM

## 2022-10-05 DIAGNOSIS — I351 Nonrheumatic aortic (valve) insufficiency: Secondary | ICD-10-CM

## 2022-10-05 DIAGNOSIS — I361 Nonrheumatic tricuspid (valve) insufficiency: Secondary | ICD-10-CM

## 2022-10-06 DIAGNOSIS — I509 Heart failure, unspecified: Secondary | ICD-10-CM | POA: Diagnosis not present

## 2022-10-08 ENCOUNTER — Telehealth: Payer: Self-pay

## 2022-10-08 DIAGNOSIS — R2681 Unsteadiness on feet: Secondary | ICD-10-CM | POA: Diagnosis not present

## 2022-10-08 DIAGNOSIS — G629 Polyneuropathy, unspecified: Secondary | ICD-10-CM | POA: Diagnosis not present

## 2022-10-08 DIAGNOSIS — I4891 Unspecified atrial fibrillation: Secondary | ICD-10-CM | POA: Diagnosis not present

## 2022-10-08 DIAGNOSIS — R278 Other lack of coordination: Secondary | ICD-10-CM | POA: Diagnosis not present

## 2022-10-08 DIAGNOSIS — M810 Age-related osteoporosis without current pathological fracture: Secondary | ICD-10-CM | POA: Diagnosis not present

## 2022-10-08 DIAGNOSIS — M6281 Muscle weakness (generalized): Secondary | ICD-10-CM | POA: Diagnosis not present

## 2022-10-08 DIAGNOSIS — N179 Acute kidney failure, unspecified: Secondary | ICD-10-CM | POA: Diagnosis not present

## 2022-10-08 DIAGNOSIS — E785 Hyperlipidemia, unspecified: Secondary | ICD-10-CM | POA: Diagnosis not present

## 2022-10-08 DIAGNOSIS — Z7401 Bed confinement status: Secondary | ICD-10-CM | POA: Diagnosis not present

## 2022-10-08 DIAGNOSIS — J8410 Pulmonary fibrosis, unspecified: Secondary | ICD-10-CM | POA: Diagnosis not present

## 2022-10-08 DIAGNOSIS — M069 Rheumatoid arthritis, unspecified: Secondary | ICD-10-CM | POA: Diagnosis not present

## 2022-10-08 DIAGNOSIS — I10 Essential (primary) hypertension: Secondary | ICD-10-CM | POA: Diagnosis not present

## 2022-10-08 DIAGNOSIS — I509 Heart failure, unspecified: Secondary | ICD-10-CM | POA: Diagnosis not present

## 2022-10-08 DIAGNOSIS — D649 Anemia, unspecified: Secondary | ICD-10-CM | POA: Diagnosis not present

## 2022-10-08 DIAGNOSIS — J9611 Chronic respiratory failure with hypoxia: Secondary | ICD-10-CM | POA: Diagnosis not present

## 2022-10-08 DIAGNOSIS — F4320 Adjustment disorder, unspecified: Secondary | ICD-10-CM | POA: Diagnosis not present

## 2022-10-08 DIAGNOSIS — I130 Hypertensive heart and chronic kidney disease with heart failure and stage 1 through stage 4 chronic kidney disease, or unspecified chronic kidney disease: Secondary | ICD-10-CM | POA: Diagnosis not present

## 2022-10-08 DIAGNOSIS — N183 Chronic kidney disease, stage 3 unspecified: Secondary | ICD-10-CM | POA: Diagnosis not present

## 2022-10-08 DIAGNOSIS — L03116 Cellulitis of left lower limb: Secondary | ICD-10-CM | POA: Diagnosis not present

## 2022-10-08 DIAGNOSIS — K219 Gastro-esophageal reflux disease without esophagitis: Secondary | ICD-10-CM | POA: Diagnosis not present

## 2022-10-08 DIAGNOSIS — I2510 Atherosclerotic heart disease of native coronary artery without angina pectoris: Secondary | ICD-10-CM | POA: Diagnosis not present

## 2022-10-08 DIAGNOSIS — R262 Difficulty in walking, not elsewhere classified: Secondary | ICD-10-CM | POA: Diagnosis not present

## 2022-10-08 DIAGNOSIS — I5023 Acute on chronic systolic (congestive) heart failure: Secondary | ICD-10-CM | POA: Diagnosis not present

## 2022-10-08 DIAGNOSIS — F419 Anxiety disorder, unspecified: Secondary | ICD-10-CM | POA: Diagnosis not present

## 2022-10-08 DIAGNOSIS — R2689 Other abnormalities of gait and mobility: Secondary | ICD-10-CM | POA: Diagnosis not present

## 2022-10-08 DIAGNOSIS — G2581 Restless legs syndrome: Secondary | ICD-10-CM | POA: Diagnosis not present

## 2022-10-08 DIAGNOSIS — R531 Weakness: Secondary | ICD-10-CM | POA: Diagnosis not present

## 2022-10-08 DIAGNOSIS — N184 Chronic kidney disease, stage 4 (severe): Secondary | ICD-10-CM | POA: Diagnosis not present

## 2022-10-08 DIAGNOSIS — F32A Depression, unspecified: Secondary | ICD-10-CM | POA: Diagnosis not present

## 2022-10-08 DIAGNOSIS — G9341 Metabolic encephalopathy: Secondary | ICD-10-CM | POA: Diagnosis not present

## 2022-10-08 NOTE — Telephone Encounter (Signed)
I talked with Elberta Fortis about his Mother.  She has been in the hospital with congestive heart failure and is being moved to [facility] for rehab.  He wants to get a hospital bed for her at home but he is going to discuss this with the Education officer, museum at [employer].  He will call us back if he needs our assistance.

## 2022-10-08 NOTE — Telephone Encounter (Signed)
Reviewed. Dr. Tobie Poet

## 2022-10-08 NOTE — Telephone Encounter (Signed)
Pts son called this afternoon over lunch to let the provider know that Sherry Burke has been in the hospital for about a week and will be going into a rehab facility for about 3 weeks. Patient has requested a nurse to call him back to discuss everything that is going on with his mother.

## 2022-10-10 DIAGNOSIS — F32A Depression, unspecified: Secondary | ICD-10-CM | POA: Diagnosis not present

## 2022-10-10 DIAGNOSIS — F4320 Adjustment disorder, unspecified: Secondary | ICD-10-CM | POA: Diagnosis not present

## 2022-10-11 DIAGNOSIS — I509 Heart failure, unspecified: Secondary | ICD-10-CM | POA: Diagnosis not present

## 2022-10-11 DIAGNOSIS — D649 Anemia, unspecified: Secondary | ICD-10-CM | POA: Diagnosis not present

## 2022-10-11 DIAGNOSIS — R262 Difficulty in walking, not elsewhere classified: Secondary | ICD-10-CM | POA: Diagnosis not present

## 2022-10-11 DIAGNOSIS — N184 Chronic kidney disease, stage 4 (severe): Secondary | ICD-10-CM | POA: Diagnosis not present

## 2022-10-16 ENCOUNTER — Other Ambulatory Visit: Payer: Self-pay | Admitting: *Deleted

## 2022-10-16 NOTE — Patient Outreach (Signed)
Mrs. Fillingim resides in [facility]. Screening for potential [facility] care coordination services as benefit of insurance plan and PCP.   Spoke with Janett Billow, SNF social worker. Mrs. Barcenas's transition plan is to return home with spouse.   Will continue to follow for potential [facility] care coordination needs.   [name], MSN, RN,BSN [place] Acute Care Coordinator [phone] (Direct dial)

## 2022-10-21 ENCOUNTER — Telehealth: Payer: Self-pay

## 2022-10-21 NOTE — Telephone Encounter (Signed)
Patient will be discharged soon and her son is asking about the hospital bed order. He would to have it ready when she comes to home.Please advice

## 2022-10-29 ENCOUNTER — Telehealth: Payer: Medicare Other

## 2022-11-01 ENCOUNTER — Inpatient Hospital Stay: Payer: Medicare Other | Admitting: Family Medicine

## 2022-11-01 ENCOUNTER — Other Ambulatory Visit: Payer: Self-pay

## 2022-11-01 DIAGNOSIS — M150 Primary generalized (osteo)arthritis: Secondary | ICD-10-CM

## 2022-11-01 DIAGNOSIS — M48062 Spinal stenosis, lumbar region with neurogenic claudication: Secondary | ICD-10-CM

## 2022-11-01 MED ORDER — HYDROCODONE-ACETAMINOPHEN 5-325 MG PO TABS: 1.0000 | ORAL_TABLET | Freq: Four times a day (QID) | ORAL | 0 refills | Status: AC | PRN

## 2022-11-06 ENCOUNTER — Inpatient Hospital Stay: Payer: Medicare Other | Admitting: Family Medicine

## 2022-11-11 DEATH — deceased

## 2022-11-28 ENCOUNTER — Telehealth: Payer: Medicare Other
